# Patient Record
Sex: Male | Born: 1950
Health system: Southern US, Community
[De-identification: ages and names within clinical notes are randomized; demographics above are authoritative.]

## PROBLEM LIST (undated history)

## (undated) DIAGNOSIS — E669 Obesity, unspecified: Secondary | ICD-10-CM

## (undated) DIAGNOSIS — G4730 Sleep apnea, unspecified: Secondary | ICD-10-CM

## (undated) DIAGNOSIS — I499 Cardiac arrhythmia, unspecified: Secondary | ICD-10-CM

## (undated) DIAGNOSIS — E785 Hyperlipidemia, unspecified: Secondary | ICD-10-CM

## (undated) DIAGNOSIS — I10 Essential (primary) hypertension: Secondary | ICD-10-CM

## (undated) DIAGNOSIS — R319 Hematuria, unspecified: Secondary | ICD-10-CM

## (undated) DIAGNOSIS — J302 Other seasonal allergic rhinitis: Secondary | ICD-10-CM

## (undated) DIAGNOSIS — I4891 Unspecified atrial fibrillation: Secondary | ICD-10-CM

## (undated) DIAGNOSIS — M1990 Unspecified osteoarthritis, unspecified site: Secondary | ICD-10-CM

## (undated) DIAGNOSIS — E1121 Type 2 diabetes mellitus with diabetic nephropathy: Secondary | ICD-10-CM

## (undated) DIAGNOSIS — N181 Chronic kidney disease, stage 1: Secondary | ICD-10-CM

## (undated) HISTORY — DX: Obesity, unspecified: E66.9

## (undated) HISTORY — PX: VASECTOMY REVERSAL: SHX243

## (undated) HISTORY — DX: Hematuria, unspecified: R31.9

## (undated) HISTORY — DX: Type 2 diabetes mellitus with diabetic nephropathy: E11.21

## (undated) HISTORY — DX: Chronic kidney disease, stage 1: N18.1

## (undated) HISTORY — DX: Unspecified atrial fibrillation: I48.91

## (undated) HISTORY — DX: Other seasonal allergic rhinitis: J30.2

## (undated) HISTORY — PX: VASECTOMY: SHX75

## (undated) HISTORY — DX: Hyperlipidemia, unspecified: E78.5

## (undated) HISTORY — DX: Essential (primary) hypertension: I10

---

## 2004-06-10 HISTORY — PX: COLONOSCOPY: SHX174

## 2005-01-02 ENCOUNTER — Ambulatory Visit: Payer: Self-pay | Admitting: Internal Medicine

## 2005-05-06 ENCOUNTER — Ambulatory Visit: Payer: Self-pay | Admitting: Internal Medicine

## 2005-05-23 ENCOUNTER — Ambulatory Visit: Payer: Self-pay | Admitting: Internal Medicine

## 2005-06-14 ENCOUNTER — Ambulatory Visit: Payer: Self-pay | Admitting: Internal Medicine

## 2005-07-05 ENCOUNTER — Ambulatory Visit: Payer: Self-pay | Admitting: Internal Medicine

## 2006-03-29 ENCOUNTER — Ambulatory Visit: Payer: Self-pay | Admitting: Family Medicine

## 2011-06-25 ENCOUNTER — Ambulatory Visit (INDEPENDENT_AMBULATORY_CARE_PROVIDER_SITE_OTHER): Payer: BC Managed Care – PPO | Admitting: Physician Assistant

## 2011-06-25 DIAGNOSIS — I10 Essential (primary) hypertension: Secondary | ICD-10-CM

## 2011-06-25 DIAGNOSIS — E782 Mixed hyperlipidemia: Secondary | ICD-10-CM

## 2011-06-25 DIAGNOSIS — R809 Proteinuria, unspecified: Secondary | ICD-10-CM

## 2011-07-21 ENCOUNTER — Other Ambulatory Visit: Payer: Self-pay | Admitting: Physician Assistant

## 2011-07-22 ENCOUNTER — Other Ambulatory Visit: Payer: Self-pay

## 2011-07-22 MED ORDER — ENALAPRIL MALEATE 10 MG PO TABS: 10.0000 mg | ORAL_TABLET | Freq: Every day | ORAL | Status: DC

## 2011-07-22 NOTE — Telephone Encounter (Signed)
PLEASE PULL CHART 

## 2011-09-14 ENCOUNTER — Other Ambulatory Visit: Payer: Self-pay | Admitting: Physician Assistant

## 2011-10-22 ENCOUNTER — Other Ambulatory Visit: Payer: Self-pay | Admitting: Internal Medicine

## 2011-12-04 ENCOUNTER — Other Ambulatory Visit: Payer: Self-pay | Admitting: Physician Assistant

## 2011-12-04 NOTE — Telephone Encounter (Signed)
Need paper chart please.  

## 2011-12-24 ENCOUNTER — Encounter: Payer: Self-pay | Admitting: Physician Assistant

## 2011-12-24 ENCOUNTER — Ambulatory Visit (INDEPENDENT_AMBULATORY_CARE_PROVIDER_SITE_OTHER): Payer: BC Managed Care – PPO | Admitting: Physician Assistant

## 2011-12-24 VITALS — BP 150/86 | HR 77 | Temp 98.4°F | Resp 16 | Ht 67.0 in | Wt 241.0 lb

## 2011-12-24 DIAGNOSIS — J309 Allergic rhinitis, unspecified: Secondary | ICD-10-CM

## 2011-12-24 DIAGNOSIS — E1169 Type 2 diabetes mellitus with other specified complication: Secondary | ICD-10-CM | POA: Insufficient documentation

## 2011-12-24 DIAGNOSIS — J302 Other seasonal allergic rhinitis: Secondary | ICD-10-CM | POA: Insufficient documentation

## 2011-12-24 DIAGNOSIS — Z0000 Encounter for general adult medical examination without abnormal findings: Secondary | ICD-10-CM

## 2011-12-24 DIAGNOSIS — E782 Mixed hyperlipidemia: Secondary | ICD-10-CM

## 2011-12-24 DIAGNOSIS — Z125 Encounter for screening for malignant neoplasm of prostate: Secondary | ICD-10-CM

## 2011-12-24 DIAGNOSIS — Z1211 Encounter for screening for malignant neoplasm of colon: Secondary | ICD-10-CM

## 2011-12-24 DIAGNOSIS — R809 Proteinuria, unspecified: Secondary | ICD-10-CM

## 2011-12-24 DIAGNOSIS — I10 Essential (primary) hypertension: Secondary | ICD-10-CM

## 2011-12-24 DIAGNOSIS — E785 Hyperlipidemia, unspecified: Secondary | ICD-10-CM | POA: Insufficient documentation

## 2011-12-24 LAB — CBC WITH DIFFERENTIAL/PLATELET
Basophils Absolute: 0 10*3/uL (ref 0.0–0.1)
Basophils Relative: 0 % (ref 0–1)
Eosinophils Absolute: 0.1 10*3/uL (ref 0.0–0.7)
Eosinophils Relative: 2 % (ref 0–5)
HCT: 42 % (ref 39.0–52.0)
Hemoglobin: 14.4 g/dL (ref 13.0–17.0)
Lymphocytes Relative: 23 % (ref 12–46)
Lymphs Abs: 2 10*3/uL (ref 0.7–4.0)
MCH: 29.1 pg (ref 26.0–34.0)
MCHC: 34.3 g/dL (ref 30.0–36.0)
MCV: 85 fL (ref 78.0–100.0)
Monocytes Absolute: 0.6 10*3/uL (ref 0.1–1.0)
Monocytes Relative: 7 % (ref 3–12)
Neutro Abs: 5.9 10*3/uL (ref 1.7–7.7)
Neutrophils Relative %: 68 % (ref 43–77)
Platelets: 294 10*3/uL (ref 150–400)
RBC: 4.94 MIL/uL (ref 4.22–5.81)
RDW: 13.9 % (ref 11.5–15.5)
WBC: 8.7 10*3/uL (ref 4.0–10.5)

## 2011-12-24 LAB — LIPID PANEL
Cholesterol: 170 mg/dL (ref 0–200)
HDL: 39 mg/dL — ABNORMAL LOW (ref >39)
LDL Cholesterol: 92 mg/dL (ref 0–99)
Total CHOL/HDL Ratio: 4.4 Ratio
Triglycerides: 194 mg/dL — ABNORMAL HIGH (ref <150)
VLDL: 39 mg/dL (ref 0–40)

## 2011-12-24 LAB — POCT URINALYSIS DIPSTICK
Bilirubin, UA: NEGATIVE
Glucose, UA: NEGATIVE
Ketones, UA: NEGATIVE
Leukocytes, UA: NEGATIVE
Nitrite, UA: NEGATIVE
Protein, UA: 30
Spec Grav, UA: 1.02
Urobilinogen, UA: 0.2
pH, UA: 6.5

## 2011-12-24 LAB — COMPREHENSIVE METABOLIC PANEL
ALT: 19 U/L (ref 0–53)
AST: 15 U/L (ref 0–37)
Albumin: 4.2 g/dL (ref 3.5–5.2)
Alkaline Phosphatase: 68 U/L (ref 39–117)
BUN: 17 mg/dL (ref 6–23)
CO2: 32 mEq/L (ref 19–32)
Calcium: 10 mg/dL (ref 8.4–10.5)
Chloride: 99 mEq/L (ref 96–112)
Creat: 0.87 mg/dL (ref 0.50–1.35)
Glucose, Bld: 109 mg/dL — ABNORMAL HIGH (ref 70–99)
Potassium: 4.5 mEq/L (ref 3.5–5.3)
Sodium: 135 mEq/L (ref 135–145)
Total Bilirubin: 0.3 mg/dL (ref 0.3–1.2)
Total Protein: 6.6 g/dL (ref 6.0–8.3)

## 2011-12-24 LAB — POCT UA - MICROSCOPIC ONLY
Bacteria, U Microscopic: NEGATIVE
Casts, Ur, LPF, POC: NEGATIVE
Crystals, Ur, HPF, POC: NEGATIVE
Yeast, UA: NEGATIVE

## 2011-12-24 LAB — IFOBT (OCCULT BLOOD): IFOBT: NEGATIVE

## 2011-12-24 LAB — PSA: PSA: 0.57 ng/mL (ref <=4.00)

## 2011-12-24 NOTE — Progress Notes (Signed)
Subjective:    Patient ID: James Robbins, male    DOB: [birth year], 61 y.o.   MRN: [id number]  HPI  This 61 y.o. Male presents for CPE. ROS performed by Anselm Pancoast, CMA, and reviewed by me.  Review of Systems  Constitutional: Negative.   HENT: Negative.   Eyes: Negative.   Respiratory: Negative.   Cardiovascular: Negative.   Gastrointestinal: Negative.   Genitourinary: Negative.   Musculoskeletal: Negative.   Skin: Negative.   Neurological: Negative.   Hematological: Negative.   Psychiatric/Behavioral: Negative.    He notes that he has not been working on healthy eating or exercise.  Past Medical History  Diagnosis Date  . Allergy     seasonal  . Obesity   . Hypertension   . Hyperlipidemia   . Proteinuria     Prior to Admission medications   Medication Sig Start Date End Date Taking? Authorizing Provider  aspirin 81 MG tablet Take 81 mg by mouth daily.   Yes Historical Provider, MD  cetirizine (ZYRTEC) 10 MG tablet Take 10 mg by mouth daily.   Yes Historical Provider, MD  enalapril (VASOTEC) 10 MG tablet Take 1 tablet (10 mg total) by mouth daily. NEEDS OFFICE VISIT/LABS FOR MORE 12/04/11  Yes Ryan M Dunn, PA-C  simvastatin (ZOCOR) 40 MG tablet TAKE 1 TABLET (40 MG TOTAL) BY MOUTH AT BEDTIME. NEEDS OFFICE VISIT FOR MORE 10/22/11  Yes Sondra Barges, PA-C    No Known Allergies  History   Social History  . Marital Status: Married    Spouse Name: N/A    Number of Children: 2  . Years of Education: 14   Occupational History  . retired [employer]    2006   Social History Main Topics  . Smoking status: Former Smoker    Quit date: 06/09/1986  . Smokeless tobacco: Never Used  . Alcohol Use: No  . Drug Use: No   Family History  Problem Relation Age of Onset  . Diabetes Mother   . Heart disease Father   . Osteoporosis Sister        Objective:   Physical Exam  Vitals reviewed. Constitutional: He is oriented to person, place, and time. Vital signs  are normal. He appears well-developed and well-nourished.  Non-toxic appearance. He does not have a sickly appearance. He does not appear ill. No distress.  HENT:  Head: Normocephalic and atraumatic. No trismus in the jaw.  Right Ear: Hearing, tympanic membrane, external ear and ear canal normal.  Left Ear: Hearing, tympanic membrane, external ear and ear canal normal.  Nose: Nose normal.  Mouth/Throat: Uvula is midline, oropharynx is clear and moist and mucous membranes are normal. He does not have dentures. No oral lesions. Normal dentition. No dental abscesses, uvula swelling, lacerations or dental caries.  Eyes: Conjunctivae and EOM are normal. Pupils are equal, round, and reactive to light. Right eye exhibits no discharge. Left eye exhibits no discharge. No scleral icterus.  Fundoscopic exam:      The right eye shows no arteriolar narrowing, no AV nicking, no exudate, no hemorrhage and no papilledema. The right eye shows red reflex.The right eye shows no venous pulsations.      The left eye shows no arteriolar narrowing, no AV nicking, no exudate, no hemorrhage and no papilledema. The left eye shows red reflex.The left eye shows no venous pulsations. Neck: Normal range of motion, full passive range of motion without pain and phonation normal. Neck supple. No spinous process  tenderness and no muscular tenderness present. No rigidity. No tracheal deviation, no edema, no erythema and normal range of motion present. No thyromegaly present.  Cardiovascular: Normal rate, regular rhythm, S1 normal, S2 normal, normal heart sounds, intact distal pulses and normal pulses.  Exam reveals no gallop and no friction rub.   No murmur heard. Pulmonary/Chest: Effort normal and breath sounds normal. No respiratory distress. He has no wheezes. He has no rales.  Abdominal: Soft. Normal appearance and bowel sounds are normal. He exhibits no distension and no mass. There is no hepatosplenomegaly. There is no  tenderness. There is no rebound and no guarding. No hernia. Hernia confirmed negative in the right inguinal area and confirmed negative in the left inguinal area.  Genitourinary: Rectum normal, prostate normal, testes normal and penis normal. Rectal exam shows no external hemorrhoid, no internal hemorrhoid, no fissure, no mass, no tenderness and anal tone normal. Guaiac negative stool. Circumcised. No phimosis, paraphimosis, hypospadias, penile erythema or penile tenderness. No discharge found.  Musculoskeletal: Normal range of motion. He exhibits no edema and no tenderness.       Right shoulder: Normal.       Left shoulder: Normal.       Right elbow: Normal.      Left elbow: Normal.       Right wrist: Normal.       Left wrist: Normal.       Right hip: Normal.       Left hip: Normal.       Right knee: Normal.       Left knee: Normal.       Right ankle: Normal. Achilles tendon normal.       Left ankle: Normal. Achilles tendon normal.       Cervical back: Normal. He exhibits normal range of motion, no tenderness, no bony tenderness, no swelling, no edema, no deformity, no laceration, no pain, no spasm and normal pulse.       Thoracic back: Normal.       Lumbar back: Normal.       Right upper arm: Normal.       Left upper arm: Normal.       Right forearm: Normal.       Left forearm: Normal.       Right hand: Normal.       Left hand: Normal.       Right upper leg: Normal.       Left upper leg: Normal.       Right lower leg: Normal.       Left lower leg: Normal.       Right foot: Normal.       Left foot: Normal.  Lymphadenopathy:       Head (right side): No submental, no submandibular, no tonsillar, no preauricular, no posterior auricular and no occipital adenopathy present.       Head (left side): No submental, no submandibular, no tonsillar, no preauricular, no posterior auricular and no occipital adenopathy present.    He has no cervical adenopathy.       Right: No inguinal and no  supraclavicular adenopathy present.       Left: No inguinal and no supraclavicular adenopathy present.  Neurological: He is alert and oriented to person, place, and time. He has normal strength and normal reflexes. He displays no tremor. No cranial nerve deficit. He exhibits normal muscle tone. Coordination and gait normal.  Skin: Skin is warm, dry and intact. No abrasion,  no ecchymosis, no laceration, no lesion and no rash noted. He is not diaphoretic. No cyanosis or erythema. No pallor. Nails show no clubbing.  Psychiatric: He has a normal mood and affect. His speech is normal and behavior is normal. Judgment and thought content normal. Cognition and memory are normal.   Results for orders placed in visit on 12/24/11  POCT UA - MICROSCOPIC ONLY      Component Value Range   WBC, Ur, HPF, POC 0-1     RBC, urine, microscopic 0-5     Bacteria, U Microscopic neg     Mucus, UA trace     Epithelial cells, urine per micros 0-2     Crystals, Ur, HPF, POC neg     Casts, Ur, LPF, POC neg     Yeast, UA neg    POCT URINALYSIS DIPSTICK      Component Value Range   Color, UA yellow     Clarity, UA clear     Glucose, UA neg     Bilirubin, UA neg     Ketones, UA neg     Spec Grav, UA 1.020     Blood, UA moderate     pH, UA 6.5     Protein, UA 30     Urobilinogen, UA 0.2     Nitrite, UA neg     Leukocytes, UA Negative    IFOBT (OCCULT BLOOD)      Component Value Range   IFOBT Negative        Assessment & Plan:   1. Routine general medical examination at a health care facility    2. Unspecified essential hypertension  CBC with Differential, Comprehensive metabolic panel  3. Other and unspecified hyperlipidemia  Lipid panel  4. Proteinuria  Microalbumin, urine, POCT UA - Microscopic Only, POCT urinalysis dipstick  5. Screening for prostate cancer  PSA  6. Screening for colon cancer  IFOBT POC (occult bld, rslt in office)   Continue current treatment.  Increase efforts for healthy eating  and regular exercise.

## 2011-12-24 NOTE — Patient Instructions (Signed)

## 2011-12-25 ENCOUNTER — Encounter: Payer: Self-pay | Admitting: Physician Assistant

## 2011-12-25 LAB — MICROALBUMIN, URINE: Microalb, Ur: 10.14 mg/dL — ABNORMAL HIGH (ref 0.00–1.89)

## 2012-01-03 ENCOUNTER — Other Ambulatory Visit: Payer: Self-pay | Admitting: Physician Assistant

## 2012-01-18 ENCOUNTER — Other Ambulatory Visit: Payer: Self-pay | Admitting: Physician Assistant

## 2012-02-07 ENCOUNTER — Other Ambulatory Visit: Payer: Self-pay | Admitting: Physician Assistant

## 2012-03-15 ENCOUNTER — Other Ambulatory Visit: Payer: Self-pay | Admitting: Physician Assistant

## 2012-04-13 ENCOUNTER — Other Ambulatory Visit: Payer: Self-pay | Admitting: Physician Assistant

## 2012-04-17 ENCOUNTER — Other Ambulatory Visit: Payer: Self-pay | Admitting: Physician Assistant

## 2012-05-19 ENCOUNTER — Other Ambulatory Visit: Payer: Self-pay | Admitting: Physician Assistant

## 2012-05-19 NOTE — Telephone Encounter (Signed)
Needs ov/labs 

## 2012-06-10 DIAGNOSIS — R319 Hematuria, unspecified: Secondary | ICD-10-CM

## 2012-06-10 HISTORY — DX: Hematuria, unspecified: R31.9

## 2012-06-27 ENCOUNTER — Other Ambulatory Visit: Payer: Self-pay | Admitting: Physician Assistant

## 2012-07-20 ENCOUNTER — Other Ambulatory Visit: Payer: Self-pay | Admitting: Physician Assistant

## 2012-07-30 ENCOUNTER — Other Ambulatory Visit: Payer: Self-pay | Admitting: Physician Assistant

## 2012-08-01 ENCOUNTER — Other Ambulatory Visit: Payer: Self-pay | Admitting: Physician Assistant

## 2012-08-03 ENCOUNTER — Other Ambulatory Visit: Payer: Self-pay | Admitting: Physician Assistant

## 2012-12-16 ENCOUNTER — Other Ambulatory Visit: Payer: Self-pay | Admitting: Physician Assistant

## 2013-01-05 ENCOUNTER — Encounter: Payer: BC Managed Care – PPO | Admitting: Physician Assistant

## 2013-01-13 ENCOUNTER — Other Ambulatory Visit: Payer: Self-pay | Admitting: Physician Assistant

## 2013-08-30 ENCOUNTER — Other Ambulatory Visit: Payer: Self-pay | Admitting: Family Medicine

## 2013-08-30 ENCOUNTER — Encounter: Payer: Self-pay | Admitting: Family Medicine

## 2013-08-30 ENCOUNTER — Ambulatory Visit (INDEPENDENT_AMBULATORY_CARE_PROVIDER_SITE_OTHER): Payer: PRIVATE HEALTH INSURANCE | Admitting: Family Medicine

## 2013-08-30 VITALS — BP 126/78 | HR 84 | Temp 98.0°F | Ht 67.25 in | Wt 244.5 lb

## 2013-08-30 DIAGNOSIS — E785 Hyperlipidemia, unspecified: Secondary | ICD-10-CM

## 2013-08-30 DIAGNOSIS — R809 Proteinuria, unspecified: Secondary | ICD-10-CM

## 2013-08-30 DIAGNOSIS — J309 Allergic rhinitis, unspecified: Secondary | ICD-10-CM

## 2013-08-30 DIAGNOSIS — E1129 Type 2 diabetes mellitus with other diabetic kidney complication: Secondary | ICD-10-CM | POA: Insufficient documentation

## 2013-08-30 DIAGNOSIS — E669 Obesity, unspecified: Secondary | ICD-10-CM

## 2013-08-30 DIAGNOSIS — E1121 Type 2 diabetes mellitus with diabetic nephropathy: Secondary | ICD-10-CM | POA: Insufficient documentation

## 2013-08-30 DIAGNOSIS — J302 Other seasonal allergic rhinitis: Secondary | ICD-10-CM

## 2013-08-30 DIAGNOSIS — R739 Hyperglycemia, unspecified: Secondary | ICD-10-CM | POA: Insufficient documentation

## 2013-08-30 DIAGNOSIS — N181 Chronic kidney disease, stage 1: Secondary | ICD-10-CM

## 2013-08-30 DIAGNOSIS — I10 Essential (primary) hypertension: Secondary | ICD-10-CM

## 2013-08-30 DIAGNOSIS — E1122 Type 2 diabetes mellitus with diabetic chronic kidney disease: Secondary | ICD-10-CM | POA: Insufficient documentation

## 2013-08-30 DIAGNOSIS — R7309 Other abnormal glucose: Secondary | ICD-10-CM

## 2013-08-30 LAB — BASIC METABOLIC PANEL
BUN: 14 mg/dL (ref 6–23)
CO2: 30 mEq/L (ref 19–32)
Calcium: 10 mg/dL (ref 8.4–10.5)
Chloride: 102 mEq/L (ref 96–112)
Creatinine, Ser: 0.9 mg/dL (ref 0.4–1.5)
GFR: 91.28 mL/min (ref >60.00)
Glucose, Bld: 150 mg/dL — ABNORMAL HIGH (ref 70–99)
Potassium: 4.1 mEq/L (ref 3.5–5.1)
Sodium: 139 mEq/L (ref 135–145)

## 2013-08-30 LAB — LIPID PANEL
Cholesterol: 224 mg/dL — ABNORMAL HIGH (ref 0–200)
HDL: 37.6 mg/dL — ABNORMAL LOW (ref >39.00)
LDL Cholesterol: 142 mg/dL — ABNORMAL HIGH (ref 0–99)
Total CHOL/HDL Ratio: 6
Triglycerides: 224 mg/dL — ABNORMAL HIGH (ref 0.0–149.0)
VLDL: 44.8 mg/dL — ABNORMAL HIGH (ref 0.0–40.0)

## 2013-08-30 LAB — MICROALBUMIN / CREATININE URINE RATIO
Creatinine,U: 17.8 mg/dL
Microalb Creat Ratio: 96.1 mg/g — ABNORMAL HIGH (ref 0.0–30.0)
Microalb, Ur: 17.1 mg/dL — ABNORMAL HIGH (ref 0.0–1.9)

## 2013-08-30 LAB — HEMOGLOBIN A1C: Hgb A1c MFr Bld: 7 % — ABNORMAL HIGH (ref 4.6–6.5)

## 2013-08-30 MED ORDER — SIMVASTATIN 40 MG PO TABS: ORAL_TABLET | ORAL | Status: DC

## 2013-08-30 MED ORDER — METFORMIN HCL 500 MG PO TABS: 500.0000 mg | ORAL_TABLET | Freq: Two times a day (BID) | ORAL | Status: DC

## 2013-08-30 MED ORDER — ENALAPRIL MALEATE 10 MG PO TABS: 10.0000 mg | ORAL_TABLET | Freq: Every day | ORAL | Status: DC

## 2013-08-30 NOTE — Assessment & Plan Note (Signed)
Continue simvastatin, check FLP today. Off statin for last several weeks.

## 2013-08-30 NOTE — Assessment & Plan Note (Signed)
Continue f/u with Dr. Remus Blake.

## 2013-08-30 NOTE — Assessment & Plan Note (Signed)
Check urine microalb/cr ratio today.  Consider restarting ACEI.

## 2013-08-30 NOTE — Progress Notes (Addendum)
BP 126/78  Pulse 84  Temp(Src) 98 F (36.7 C) (Oral)  Ht 5' 7.25" (1.708 m)  Wt 244 lb 8 oz (110.904 kg)  BMI 38.02 kg/m2  SpO2 96%   CC: new pt to establish  Subjective:    Patient ID: James Robbins, male    DOB: [birth year], 63 y.o.   MRN: [id number]  HPI: James Robbins is a 63 y.o. male presenting on 08/30/2013 for Establish Care   Prior seen at [facility] then [facility].  Actually ran out of meds for last several weeks.  Seasonal allergic rhintis - sees Dr. Remus Blake and gets weekly allergy shots - immunotherapy for last 10 years.  HTN - ran out of enalapril 10mg  daily.  Has bp checked weekly at allergy shots.  Hyperglycemia - mother with h/o diabetes on insulin.  Pt is on metformin but no A1c in chart.  Will check today.  HLD - compliant with simvastatin without muscle aches.  Preventative: Last CPE several years ago. Flu - through city yearly zostavax 2012  Lives with wife and children, no pets Occupation: retired Chemical engineer Edu: HS Activity: no regular exercise Diet: good water, fruits/vegetables daily, stopped sodas  Relevant past medical, surgical, family and social history reviewed and updated as indicated.  Allergies and medications reviewed and updated. Current Outpatient Prescriptions on File Prior to Visit  Medication Sig  . aspirin 81 MG tablet Take 81 mg by mouth daily.  . cetirizine (ZYRTEC) 10 MG tablet Take 10 mg by mouth daily.   No current facility-administered medications on file prior to visit.    Review of Systems Per HPI unless specifically indicated above    Objective:    BP 126/78  Pulse 84  Temp(Src) 98 F (36.7 C) (Oral)  Ht 5' 7.25" (1.708 m)  Wt 244 lb 8 oz (110.904 kg)  BMI 38.02 kg/m2  SpO2 96%  Physical Exam  Nursing note and vitals reviewed. Constitutional: He is oriented to person, place, and time. He appears well-developed and well-nourished. No distress.  HENT:  Head: Normocephalic and atraumatic.  Right Ear:  Hearing, tympanic membrane, external ear and ear canal normal.  Left Ear: Hearing, tympanic membrane, external ear and ear canal normal.  Nose: Nose normal.  Mouth/Throat: Uvula is midline, oropharynx is clear and moist and mucous membranes are normal. No oropharyngeal exudate, posterior oropharyngeal edema or posterior oropharyngeal erythema.  Eyes: Conjunctivae and EOM are normal. Pupils are equal, round, and reactive to light. No scleral icterus.  Neck: Normal range of motion. Neck supple. Carotid bruit is not present. No thyromegaly present.  Cardiovascular: Normal rate, regular rhythm, normal heart sounds and intact distal pulses.   No murmur heard. Pulses:      Radial pulses are 2+ on the right side, and 2+ on the left side.  Pulmonary/Chest: Effort normal and breath sounds normal. No respiratory distress. He has no wheezes. He has no rales.  Musculoskeletal: Normal range of motion. He exhibits no edema.  Lymphadenopathy:    He has no cervical adenopathy.  Neurological: He is alert and oriented to person, place, and time.  CN grossly intact, station and gait intact  Skin: Skin is warm and dry. No rash noted.  Psychiatric: He has a normal mood and affect. His behavior is normal. Judgment and thought content normal.       Assessment & Plan:   Problem List Items Addressed This Visit   HLD (hyperlipidemia)     Continue simvastatin, check FLP today. Off  statin for last several weeks.    Relevant Medications      simvastatin (ZOCOR) tablet   HTN (hypertension) - Primary      Carries this dx, prior on enalapril 10mg  daily and hctz 12.5mg  daily, but off meds for last several weeks and today's BP stable. BP Readings from Last 3 Encounters:  08/30/13 126/78  12/24/11 150/86  Will continue to monitor, pt will monitor at allergist's office and notify me if persistently elevated. Discussed possible need for ACEI despite normal blood pressures for renoprotective effect.     Hyperglycemia     Check fasting glucose and A1c today.  continue metformin for now, but with option of decreasing.    Relevant Orders      Hemoglobin A1c   Obesity     Body mass index is 38.02 kg/(m^2). Encouraged regular exercise in form of walking.   Pt will work on this and will reassess weight next visit.    Relevant Medications      metFORMIN (GLUCOPHAGE) tablet   Proteinuria     Check urine microalb/cr ratio today.  Consider restarting ACEI.    Relevant Orders      Microalbumin / creatinine urine ratio   Seasonal allergic rhinitis     Continue f/u with Dr. Remus Blake.        Follow up plan: Return in about 6 months (around 03/02/2014), or as needed, for annual exam, prior fasting for blood work.

## 2013-08-30 NOTE — Patient Instructions (Signed)
Let's hold blood pressure medicines for now, and recheck next visit.   Keep an eye on blood pressure at allergist's office - and let me know if your blood pressure is consistently >140/90. I've refilled simvastatin and metformin - continue for now. We will let you know plan based on today's blood work. Blood work and urine checked today for protein. Work on incorporating exercise into your routine - maybe some walking daily? Good to meet you today, return in 6 months for physical.

## 2013-08-30 NOTE — Assessment & Plan Note (Signed)
Body mass index is 38.02 kg/(m^2). Encouraged regular exercise in form of walking.   Pt will work on this and will reassess weight next visit.

## 2013-08-30 NOTE — Assessment & Plan Note (Addendum)
Check fasting glucose and A1c today.  continue metformin for now, but with option of decreasing.

## 2013-08-30 NOTE — Assessment & Plan Note (Signed)
Carries this dx, prior on enalapril 10mg  daily and hctz 12.5mg  daily, but off meds for last several weeks and today's BP stable. BP Readings from Last 3 Encounters:  08/30/13 126/78  12/24/11 150/86  Will continue to monitor, pt will monitor at allergist's office and notify me if persistently elevated. Discussed possible need for ACEI despite normal blood pressures for renoprotective effect.

## 2013-08-30 NOTE — Progress Notes (Signed)
Pre visit review using our clinic review tool, if applicable. No additional management support is needed unless otherwise documented below in the visit note. 

## 2013-08-31 ENCOUNTER — Telehealth: Payer: Self-pay | Admitting: Family Medicine

## 2013-08-31 NOTE — Telephone Encounter (Signed)
Relevant patient education assigned to patient using Emmi. ° °

## 2013-09-04 ENCOUNTER — Encounter: Payer: Self-pay | Admitting: Family Medicine

## 2013-09-17 ENCOUNTER — Telehealth: Payer: Self-pay | Admitting: Family Medicine

## 2013-09-17 NOTE — Telephone Encounter (Signed)
Spoke with pt.  He would like to pick up medical records that have been reviewed by Dr. Danise Mina.  Placed in box in front office.

## 2014-02-12 ENCOUNTER — Other Ambulatory Visit: Payer: Self-pay | Admitting: Family Medicine

## 2014-02-12 DIAGNOSIS — E1121 Type 2 diabetes mellitus with diabetic nephropathy: Secondary | ICD-10-CM

## 2014-02-12 DIAGNOSIS — N181 Chronic kidney disease, stage 1: Secondary | ICD-10-CM

## 2014-02-12 DIAGNOSIS — E669 Obesity, unspecified: Secondary | ICD-10-CM

## 2014-02-12 DIAGNOSIS — E785 Hyperlipidemia, unspecified: Secondary | ICD-10-CM

## 2014-02-12 DIAGNOSIS — Z125 Encounter for screening for malignant neoplasm of prostate: Secondary | ICD-10-CM

## 2014-02-12 DIAGNOSIS — I10 Essential (primary) hypertension: Secondary | ICD-10-CM

## 2014-02-15 ENCOUNTER — Other Ambulatory Visit: Payer: PRIVATE HEALTH INSURANCE

## 2014-02-16 ENCOUNTER — Other Ambulatory Visit (INDEPENDENT_AMBULATORY_CARE_PROVIDER_SITE_OTHER): Payer: PRIVATE HEALTH INSURANCE

## 2014-02-16 DIAGNOSIS — N058 Unspecified nephritic syndrome with other morphologic changes: Secondary | ICD-10-CM

## 2014-02-16 DIAGNOSIS — E1129 Type 2 diabetes mellitus with other diabetic kidney complication: Secondary | ICD-10-CM

## 2014-02-16 DIAGNOSIS — R739 Hyperglycemia, unspecified: Secondary | ICD-10-CM

## 2014-02-16 DIAGNOSIS — N181 Chronic kidney disease, stage 1: Secondary | ICD-10-CM

## 2014-02-16 DIAGNOSIS — I10 Essential (primary) hypertension: Secondary | ICD-10-CM

## 2014-02-16 DIAGNOSIS — R7309 Other abnormal glucose: Secondary | ICD-10-CM

## 2014-02-16 DIAGNOSIS — E785 Hyperlipidemia, unspecified: Secondary | ICD-10-CM

## 2014-02-16 DIAGNOSIS — Z125 Encounter for screening for malignant neoplasm of prostate: Secondary | ICD-10-CM

## 2014-02-16 DIAGNOSIS — E1121 Type 2 diabetes mellitus with diabetic nephropathy: Secondary | ICD-10-CM

## 2014-02-16 LAB — LIPID PANEL
Cholesterol: 155 mg/dL (ref 0–200)
HDL: 37.7 mg/dL — ABNORMAL LOW (ref >39.00)
LDL Cholesterol: 90 mg/dL (ref 0–99)
NonHDL: 117.3
Total CHOL/HDL Ratio: 4
Triglycerides: 139 mg/dL (ref 0.0–149.0)
VLDL: 27.8 mg/dL (ref 0.0–40.0)

## 2014-02-16 LAB — RENAL FUNCTION PANEL
Albumin: 4.2 g/dL (ref 3.5–5.2)
BUN: 14 mg/dL (ref 6–23)
CO2: 27 mEq/L (ref 19–32)
Calcium: 9.9 mg/dL (ref 8.4–10.5)
Chloride: 101 mEq/L (ref 96–112)
Creatinine, Ser: 0.9 mg/dL (ref 0.4–1.5)
GFR: 85.58 mL/min (ref >60.00)
Glucose, Bld: 149 mg/dL — ABNORMAL HIGH (ref 70–99)
Phosphorus: 3 mg/dL (ref 2.3–4.6)
Potassium: 4.3 mEq/L (ref 3.5–5.1)
Sodium: 137 mEq/L (ref 135–145)

## 2014-02-16 LAB — MICROALBUMIN / CREATININE URINE RATIO
Creatinine,U: 21.9 mg/dL
Microalb Creat Ratio: 83.3 mg/g — ABNORMAL HIGH (ref 0.0–30.0)
Microalb, Ur: 18.2 mg/dL — ABNORMAL HIGH (ref 0.0–1.9)

## 2014-02-16 LAB — PSA, MEDICARE: PSA: 0.74 ng/ml (ref 0.10–4.00)

## 2014-02-16 LAB — HEMOGLOBIN A1C: Hgb A1c MFr Bld: 6.8 % — ABNORMAL HIGH (ref 4.6–6.5)

## 2014-02-21 ENCOUNTER — Encounter: Payer: Self-pay | Admitting: Family Medicine

## 2014-02-21 ENCOUNTER — Ambulatory Visit (INDEPENDENT_AMBULATORY_CARE_PROVIDER_SITE_OTHER): Payer: PRIVATE HEALTH INSURANCE | Admitting: Family Medicine

## 2014-02-21 VITALS — BP 140/78 | HR 92 | Temp 97.9°F | Ht 67.25 in | Wt 241.2 lb

## 2014-02-21 DIAGNOSIS — E1121 Type 2 diabetes mellitus with diabetic nephropathy: Secondary | ICD-10-CM

## 2014-02-21 DIAGNOSIS — I10 Essential (primary) hypertension: Secondary | ICD-10-CM

## 2014-02-21 DIAGNOSIS — N181 Chronic kidney disease, stage 1: Secondary | ICD-10-CM

## 2014-02-21 DIAGNOSIS — N058 Unspecified nephritic syndrome with other morphologic changes: Secondary | ICD-10-CM

## 2014-02-21 DIAGNOSIS — E1129 Type 2 diabetes mellitus with other diabetic kidney complication: Secondary | ICD-10-CM

## 2014-02-21 DIAGNOSIS — Z0001 Encounter for general adult medical examination with abnormal findings: Secondary | ICD-10-CM | POA: Insufficient documentation

## 2014-02-21 DIAGNOSIS — E669 Obesity, unspecified: Secondary | ICD-10-CM

## 2014-02-21 DIAGNOSIS — Z0000 Encounter for general adult medical examination without abnormal findings: Secondary | ICD-10-CM

## 2014-02-21 DIAGNOSIS — E785 Hyperlipidemia, unspecified: Secondary | ICD-10-CM

## 2014-02-21 DIAGNOSIS — Z23 Encounter for immunization: Secondary | ICD-10-CM

## 2014-02-21 NOTE — Assessment & Plan Note (Signed)
Continue to encourage weight loss through healthy diet and lifestyle.

## 2014-02-21 NOTE — Addendum Note (Signed)
Addended by: Royann Shivers A on: 02/21/2014 11:14 AM   Modules accepted: Orders

## 2014-02-21 NOTE — Patient Instructions (Addendum)
prevnar today. Try to increase regular activity level for goal weight loss. Get your flu shot at the city over next few weeks. Ask up front to contact [facility] to place colonoscopy in electronic chart. Return as needed or in 6 months for follow up diabetes.

## 2014-02-21 NOTE — Progress Notes (Signed)
Pre visit review using our clinic review tool, if applicable. No additional management support is needed unless otherwise documented below in the visit note. 

## 2014-02-21 NOTE — Assessment & Plan Note (Signed)
Chronic ,stable. Encouraged increased aerobic exercise to raise HDL. LDL at goal.

## 2014-02-21 NOTE — Assessment & Plan Note (Signed)
Preventative protocols reviewed and updated unless pt declined. Discussed healthy diet and lifestyle.  

## 2014-02-21 NOTE — Assessment & Plan Note (Signed)
Reviewed with patient - continue enalapril.

## 2014-02-21 NOTE — Assessment & Plan Note (Signed)
Chronic, stable. Continue regimen. 

## 2014-02-21 NOTE — Assessment & Plan Note (Signed)
Chronic, stable. Continue metformin regimen. Encouraged he remember to take bid (tends to forget PM dose). Lab Results  Component Value Date   HGBA1C 6.8* 02/16/2014

## 2014-02-21 NOTE — Progress Notes (Signed)
BP 140/78  Pulse 92  Temp(Src) 97.9 F (36.6 C) (Oral)  Ht 5' 7.25" (1.708 m)  Wt 241 lb 4 oz (109.43 kg)  BMI 37.51 kg/m2   CC: CPE Subjective:    Patient ID: James Robbins, male    DOB: 1950/06/25, 63 y.o.   MRN: [id number]  HPI: James Robbins is a 63 y.o. male presenting on 02/21/2014 for Annual Exam   Reviewed HTN, HLD, DM control.  Preventative: Colon cancer screening - colonoscopy 2006. Denies BM changes. No blood in stool. Prostate cancer screening - unsure. Discussed. Would like to continue. Flu - through city yearly zostavax 2012  prevnar - today Seat belt use discussed Sunscreen use discussed - no changing moles  Lives with wife and children, no pets  Occupation: retired Chemical engineer  Edu: HS  Activity: no regular exercise  Diet: good water, fruits/vegetables daily, stopped sodas  Relevant past medical, surgical, family and social history reviewed and updated as indicated.  Allergies and medications reviewed and updated. Current Outpatient Prescriptions on File Prior to Visit  Medication Sig  . aspirin 81 MG tablet Take 81 mg by mouth daily.  . cetirizine (ZYRTEC) 10 MG tablet Take 10 mg by mouth daily.  . enalapril (VASOTEC) 10 MG tablet Take 1 tablet (10 mg total) by mouth daily.  . metFORMIN (GLUCOPHAGE) 500 MG tablet Take 1 tablet (500 mg total) by mouth 2 (two) times daily with a meal.  . simvastatin (ZOCOR) 40 MG tablet TAKE 1 TABLET (40 MG TOTAL) BY MOUTH AT BEDTIME.   No current facility-administered medications on file prior to visit.    Review of Systems  Constitutional: Negative for fever, chills, activity change, appetite change, fatigue and unexpected weight change.  HENT: Negative for hearing loss.   Eyes: Negative for visual disturbance.  Respiratory: Negative for cough, chest tightness, shortness of breath and wheezing.   Cardiovascular: Negative for chest pain, palpitations and leg swelling.  Gastrointestinal: Negative for nausea,  vomiting, abdominal pain, diarrhea, constipation, blood in stool and abdominal distention.  Genitourinary: Negative for hematuria and difficulty urinating.  Musculoskeletal: Negative for arthralgias, myalgias and neck pain.  Skin: Negative for rash.  Neurological: Negative for dizziness, seizures, syncope and headaches.  Hematological: Negative for adenopathy. Does not bruise/bleed easily.  Psychiatric/Behavioral: Negative for dysphoric mood. The patient is not nervous/anxious.    Per HPI unless specifically indicated above    Objective:    BP 140/78  Pulse 92  Temp(Src) 97.9 F (36.6 C) (Oral)  Ht 5' 7.25" (1.708 m)  Wt 241 lb 4 oz (109.43 kg)  BMI 37.51 kg/m2  Physical Exam  Nursing note and vitals reviewed. Constitutional: He is oriented to person, place, and time. He appears well-developed and well-nourished. No distress.  HENT:  Head: Normocephalic and atraumatic.  Right Ear: Hearing, tympanic membrane, external ear and ear canal normal.  Left Ear: Hearing, tympanic membrane, external ear and ear canal normal.  Nose: Nose normal.  Mouth/Throat: Uvula is midline, oropharynx is clear and moist and mucous membranes are normal. No oropharyngeal exudate, posterior oropharyngeal edema or posterior oropharyngeal erythema.  Eyes: Conjunctivae and EOM are normal. Pupils are equal, round, and reactive to light. No scleral icterus.  Neck: Normal range of motion. Neck supple. Carotid bruit is not present. No thyromegaly present.  Cardiovascular: Normal rate, regular rhythm, normal heart sounds and intact distal pulses.   No murmur heard. Pulses:      Radial pulses are 2+ on the right side, and  2+ on the left side.  Pulmonary/Chest: Effort normal and breath sounds normal. No respiratory distress. He has no wheezes. He has no rales.  Abdominal: Soft. Bowel sounds are normal. He exhibits no distension and no mass. There is no tenderness. There is no rebound and no guarding.  Genitourinary:  Rectum normal and prostate normal. Rectal exam shows no external hemorrhoid, no internal hemorrhoid, no fissure, no mass, no tenderness and anal tone normal. Prostate is not enlarged (15gm) and not tender.  Musculoskeletal: Normal range of motion. He exhibits no edema.  Lymphadenopathy:    He has no cervical adenopathy.  Neurological: He is alert and oriented to person, place, and time.  CN grossly intact, station and gait intact  Skin: Skin is warm and dry. No rash noted.  Psychiatric: He has a normal mood and affect. His behavior is normal. Judgment and thought content normal.   Results for orders placed in visit on 02/16/14  LIPID PANEL      Result Value Ref Range   Cholesterol 155  0 - 200 mg/dL   Triglycerides 139.0  0.0 - 149.0 mg/dL   HDL 37.70 (*) >39.00 mg/dL   VLDL 27.8  0.0 - 40.0 mg/dL   LDL Cholesterol 90  0 - 99 mg/dL   Total CHOL/HDL Ratio 4     NonHDL 117.30    HEMOGLOBIN A1C      Result Value Ref Range   Hemoglobin A1C 6.8 (*) 4.6 - 6.5 %  RENAL FUNCTION PANEL      Result Value Ref Range   Sodium 137  135 - 145 mEq/L   Potassium 4.3  3.5 - 5.1 mEq/L   Chloride 101  96 - 112 mEq/L   CO2 27  19 - 32 mEq/L   Calcium 9.9  8.4 - 10.5 mg/dL   Albumin 4.2  3.5 - 5.2 g/dL   BUN 14  6 - 23 mg/dL   Creatinine, Ser 0.9  0.4 - 1.5 mg/dL   Glucose, Bld 149 (*) 70 - 99 mg/dL   Phosphorus 3.0  2.3 - 4.6 mg/dL   GFR 85.58  >60.00 mL/min  MICROALBUMIN / CREATININE URINE RATIO      Result Value Ref Range   Microalb, Ur 18.2 (*) 0.0 - 1.9 mg/dL   Creatinine,U 21.9     Microalb Creat Ratio 83.3 (*) 0.0 - 30.0 mg/g  PSA, MEDICARE      Result Value Ref Range   PSA 0.74  0.10 - 4.00 ng/ml      Assessment & Plan:   Problem List Items Addressed This Visit   Obesity     Continue to encourage weight loss through healthy diet and lifestyle.    HTN (hypertension)     Chronic, stable. Continue regimen.    HLD (hyperlipidemia)     Chronic ,stable. Encouraged increased aerobic  exercise to raise HDL. LDL at goal.    Health maintenance examination - Primary     Preventative protocols reviewed and updated unless pt declined. Discussed healthy diet and lifestyle.    Controlled type 2 diabetes mellitus with diabetic nephropathy      Chronic, stable. Continue metformin regimen. Encouraged he remember to take bid (tends to forget PM dose). Lab Results  Component Value Date   HGBA1C 6.8* 02/16/2014      Chronic kidney disease (CKD), stage I     Reviewed with patient - continue enalapril.        Follow up plan: Return in about 6  months (around 08/22/2014), or as needed, for follow up visit.

## 2014-08-17 ENCOUNTER — Other Ambulatory Visit: Payer: Self-pay | Admitting: Family Medicine

## 2014-08-22 ENCOUNTER — Ambulatory Visit: Payer: PRIVATE HEALTH INSURANCE | Admitting: Family Medicine

## 2015-05-19 ENCOUNTER — Other Ambulatory Visit: Payer: Self-pay | Admitting: Family Medicine

## 2015-06-20 ENCOUNTER — Encounter: Payer: Self-pay | Admitting: Internal Medicine

## 2015-07-12 ENCOUNTER — Telehealth: Payer: Self-pay

## 2015-07-12 ENCOUNTER — Other Ambulatory Visit: Payer: Self-pay | Admitting: Family Medicine

## 2015-07-12 DIAGNOSIS — E1121 Type 2 diabetes mellitus with diabetic nephropathy: Secondary | ICD-10-CM

## 2015-07-12 DIAGNOSIS — N181 Chronic kidney disease, stage 1: Secondary | ICD-10-CM

## 2015-07-12 DIAGNOSIS — Z125 Encounter for screening for malignant neoplasm of prostate: Secondary | ICD-10-CM

## 2015-07-12 DIAGNOSIS — E785 Hyperlipidemia, unspecified: Secondary | ICD-10-CM

## 2015-07-12 DIAGNOSIS — I10 Essential (primary) hypertension: Secondary | ICD-10-CM

## 2015-07-12 NOTE — Telephone Encounter (Signed)
Attempted to reach patient to discuss AWV 

## 2015-07-13 ENCOUNTER — Other Ambulatory Visit (INDEPENDENT_AMBULATORY_CARE_PROVIDER_SITE_OTHER): Payer: PRIVATE HEALTH INSURANCE

## 2015-07-13 DIAGNOSIS — N181 Chronic kidney disease, stage 1: Secondary | ICD-10-CM

## 2015-07-13 DIAGNOSIS — Z125 Encounter for screening for malignant neoplasm of prostate: Secondary | ICD-10-CM

## 2015-07-13 DIAGNOSIS — E785 Hyperlipidemia, unspecified: Secondary | ICD-10-CM

## 2015-07-13 DIAGNOSIS — E1121 Type 2 diabetes mellitus with diabetic nephropathy: Secondary | ICD-10-CM

## 2015-07-13 DIAGNOSIS — I10 Essential (primary) hypertension: Secondary | ICD-10-CM | POA: Diagnosis not present

## 2015-07-13 LAB — BASIC METABOLIC PANEL
BUN: 17 mg/dL (ref 6–23)
CO2: 28 mEq/L (ref 19–32)
Calcium: 9.7 mg/dL (ref 8.4–10.5)
Chloride: 105 mEq/L (ref 96–112)
Creatinine, Ser: 0.89 mg/dL (ref 0.40–1.50)
GFR: 90.76 mL/min (ref >60.00)
Glucose, Bld: 130 mg/dL — ABNORMAL HIGH (ref 70–99)
Potassium: 4.3 mEq/L (ref 3.5–5.1)
Sodium: 141 mEq/L (ref 135–145)

## 2015-07-13 LAB — LIPID PANEL
Cholesterol: 167 mg/dL (ref 0–200)
HDL: 50.4 mg/dL (ref >39.00)
LDL Cholesterol: 93 mg/dL (ref 0–99)
NonHDL: 116.48
Total CHOL/HDL Ratio: 3
Triglycerides: 115 mg/dL (ref 0.0–149.0)
VLDL: 23 mg/dL (ref 0.0–40.0)

## 2015-07-13 LAB — PSA: PSA: 0.87 ng/mL (ref 0.10–4.00)

## 2015-07-13 LAB — HEMOGLOBIN A1C: Hgb A1c MFr Bld: 5.9 % (ref 4.6–6.5)

## 2015-07-13 LAB — MICROALBUMIN / CREATININE URINE RATIO
Creatinine,U: 77.2 mg/dL
Microalb Creat Ratio: 54.8 mg/g — ABNORMAL HIGH (ref 0.0–30.0)
Microalb, Ur: 42.3 mg/dL — ABNORMAL HIGH (ref 0.0–1.9)

## 2015-07-20 ENCOUNTER — Ambulatory Visit (INDEPENDENT_AMBULATORY_CARE_PROVIDER_SITE_OTHER): Payer: PRIVATE HEALTH INSURANCE | Admitting: Family Medicine

## 2015-07-20 ENCOUNTER — Encounter: Payer: Self-pay | Admitting: Family Medicine

## 2015-07-20 VITALS — BP 152/90 | HR 85 | Temp 98.3°F | Ht 67.0 in | Wt 227.2 lb

## 2015-07-20 DIAGNOSIS — Z0000 Encounter for general adult medical examination without abnormal findings: Secondary | ICD-10-CM | POA: Diagnosis not present

## 2015-07-20 DIAGNOSIS — Z23 Encounter for immunization: Secondary | ICD-10-CM

## 2015-07-20 DIAGNOSIS — E785 Hyperlipidemia, unspecified: Secondary | ICD-10-CM

## 2015-07-20 DIAGNOSIS — Z6835 Body mass index (BMI) 35.0-35.9, adult: Secondary | ICD-10-CM

## 2015-07-20 DIAGNOSIS — E1122 Type 2 diabetes mellitus with diabetic chronic kidney disease: Secondary | ICD-10-CM

## 2015-07-20 DIAGNOSIS — Z7189 Other specified counseling: Secondary | ICD-10-CM | POA: Insufficient documentation

## 2015-07-20 DIAGNOSIS — E1121 Type 2 diabetes mellitus with diabetic nephropathy: Secondary | ICD-10-CM

## 2015-07-20 DIAGNOSIS — N181 Chronic kidney disease, stage 1: Secondary | ICD-10-CM

## 2015-07-20 DIAGNOSIS — I10 Essential (primary) hypertension: Secondary | ICD-10-CM

## 2015-07-20 MED ORDER — ENALAPRIL MALEATE 20 MG PO TABS: ORAL_TABLET | ORAL | Status: DC

## 2015-07-20 MED ORDER — METFORMIN HCL 500 MG PO TABS: ORAL_TABLET | ORAL | Status: DC

## 2015-07-20 MED ORDER — SIMVASTATIN 40 MG PO TABS: ORAL_TABLET | ORAL | Status: DC

## 2015-07-20 NOTE — Assessment & Plan Note (Signed)
Above goal. Increase enalapril to 20mg  daily. RTC 10d Cr/K chcek.

## 2015-07-20 NOTE — Addendum Note (Signed)
Addended by: Tammi Sou on: 07/20/2015 12:24 PM   Modules accepted: Orders

## 2015-07-20 NOTE — Assessment & Plan Note (Signed)
Congratulated on weight loss noted. Pt motivated to continue healthy diet and lifestyle changes.

## 2015-07-20 NOTE — Progress Notes (Signed)
BP 152/90 mmHg  Pulse 85  Temp(Src) 98.3 F (36.8 C) (Oral)  Ht 5\' 7"  (1.702 m)  Wt 227 lb 4 oz (103.08 kg)  BMI 35.58 kg/m2  SpO2 95%   CC: CPE - does not have medicare  Subjective:    Patient ID: James Robbins, male    DOB: 25-Sep-1950, 65 y.o.   MRN: [id number]  HPI: James Robbins is a 65 y.o. male presenting on 07/20/2015 for Medicare Wellness   Has medicare part A only.  bp elevated today.  DM - doesn't check sugars. Takes metformin 500mg  bid without GI side effects. Some confusion on diet.  Diabetic Foot Exam - Simple   Simple Foot Form  Diabetic Foot exam was performed with the following findings:  Yes 07/20/2015 12:18 PM  Visual Inspection  No deformities, no ulcerations, no other skin breakdown bilaterally:  Yes  Sensation Testing  Intact to touch and monofilament testing bilaterally:  Yes  Pulse Check  Posterior Tibialis and Dorsalis pulse intact bilaterally:  Yes  Comments      HLD - stable on zocor daily.   Preventative: Colon cancer screening - colonoscopy 2006 normal. Denies BM changes. No blood in stool. Last colonoscopy woke up early. Leery of colonoscopies but desires scheduled for later in the year.  Prostate cancer screening - Discussed. Would like to continue.  Flu - through city yearly prevnar - 02/2014, pneumovax today zostavax 2012  Advanced directives - does not have set up. Would want wife to be HCPOA.  Packet provided today.  Seat belt use discussed Sunscreen use discussed - no changing moles  Lives with wife and children, no pets  Occupation: retired Chemical engineer  Edu: HS  Activity: no regular exercise  Diet: good water, fruits/vegetables daily, stopped sodas  Relevant past medical, surgical, family and social history reviewed and updated as indicated. Interim medical history since our last visit reviewed. Allergies and medications reviewed and updated. Current Outpatient Prescriptions on File Prior to Visit  Medication Sig  . aspirin  81 MG tablet Take 81 mg by mouth daily.  . cetirizine (ZYRTEC) 10 MG tablet Take 10 mg by mouth daily.   No current facility-administered medications on file prior to visit.    Review of Systems  Constitutional: Negative for fever, chills, activity change, appetite change, fatigue and unexpected weight change.  HENT: Negative for hearing loss.   Eyes: Negative for visual disturbance.  Respiratory: Negative for cough, chest tightness, shortness of breath and wheezing.   Cardiovascular: Negative for chest pain, palpitations and leg swelling.  Gastrointestinal: Negative for nausea, vomiting, abdominal pain, diarrhea, constipation, blood in stool and abdominal distention.  Genitourinary: Negative for hematuria and difficulty urinating.  Musculoskeletal: Negative for myalgias, arthralgias and neck pain.  Skin: Negative for rash.  Neurological: Negative for dizziness, seizures, syncope and headaches.  Hematological: Negative for adenopathy. Does not bruise/bleed easily.  Psychiatric/Behavioral: Negative for dysphoric mood. The patient is not nervous/anxious.    Per HPI unless specifically indicated in ROS section     Objective:    BP 152/90 mmHg  Pulse 85  Temp(Src) 98.3 F (36.8 C) (Oral)  Ht 5\' 7"  (1.702 m)  Wt 227 lb 4 oz (103.08 kg)  BMI 35.58 kg/m2  SpO2 95%  Wt Readings from Last 3 Encounters:  07/20/15 227 lb 4 oz (103.08 kg)  02/21/14 241 lb 4 oz (109.43 kg)  08/30/13 244 lb 8 oz (110.904 kg)    Physical Exam  Constitutional: He is oriented  to person, place, and time. He appears well-developed and well-nourished. No distress.  HENT:  Head: Normocephalic and atraumatic.  Right Ear: Hearing, tympanic membrane, external ear and ear canal normal.  Left Ear: Hearing, tympanic membrane, external ear and ear canal normal.  Nose: Nose normal.  Mouth/Throat: Uvula is midline, oropharynx is clear and moist and mucous membranes are normal. No oropharyngeal exudate, posterior  oropharyngeal edema or posterior oropharyngeal erythema.  Eyes: Conjunctivae and EOM are normal. Pupils are equal, round, and reactive to light. No scleral icterus.  Neck: Normal range of motion. Neck supple. Carotid bruit is not present. No thyromegaly present.  Cardiovascular: Normal rate, regular rhythm, normal heart sounds and intact distal pulses.   No murmur heard. Pulses:      Radial pulses are 2+ on the right side, and 2+ on the left side.  Pulmonary/Chest: Effort normal and breath sounds normal. No respiratory distress. He has no wheezes. He has no rales.  Abdominal: Soft. Bowel sounds are normal. He exhibits no distension and no mass. There is no tenderness. There is no rebound and no guarding.  Genitourinary: Rectum normal and prostate normal. Rectal exam shows no external hemorrhoid, no internal hemorrhoid, no fissure, no mass, no tenderness and anal tone normal. Prostate is not enlarged (20gm) and not tender.  Musculoskeletal: Normal range of motion. He exhibits no edema.  Lymphadenopathy:    He has no cervical adenopathy.  Neurological: He is alert and oriented to person, place, and time.  CN grossly intact, station and gait intact  Skin: Skin is warm and dry. No rash noted.  Psychiatric: He has a normal mood and affect. His behavior is normal. Judgment and thought content normal.  Nursing note and vitals reviewed.  Results for orders placed or performed in visit on 07/13/15  Microalbumin / creatinine urine ratio  Result Value Ref Range   Microalb, Ur 42.3 (H) 0.0 - 1.9 mg/dL   Creatinine,U 77.2 mg/dL   Microalb Creat Ratio 54.8 (H) 0.0 - 30.0 mg/g  Lipid panel  Result Value Ref Range   Cholesterol 167 0 - 200 mg/dL   Triglycerides 115.0 0.0 - 149.0 mg/dL   HDL 50.40 >39.00 mg/dL   VLDL 23.0 0.0 - 40.0 mg/dL   LDL Cholesterol 93 0 - 99 mg/dL   Total CHOL/HDL Ratio 3    NonHDL 116.48   Hemoglobin A1c  Result Value Ref Range   Hgb A1c MFr Bld 5.9 4.6 - 6.5 %  PSA    Result Value Ref Range   PSA 0.87 0.10 - 4.00 ng/mL  Basic metabolic panel  Result Value Ref Range   Sodium 141 135 - 145 mEq/L   Potassium 4.3 3.5 - 5.1 mEq/L   Chloride 105 96 - 112 mEq/L   CO2 28 19 - 32 mEq/L   Glucose, Bld 130 (H) 70 - 99 mg/dL   BUN 17 6 - 23 mg/dL   Creatinine, Ser 0.89 0.40 - 1.50 mg/dL   Calcium 9.7 8.4 - 10.5 mg/dL   GFR 90.76 >60.00 mL/min      Assessment & Plan:   Problem List Items Addressed This Visit    HTN (hypertension)    Above goal. Increase enalapril to 20mg  daily. RTC 10d Cr/K chcek.      Relevant Medications   enalapril (VASOTEC) 20 MG tablet   simvastatin (ZOCOR) 40 MG tablet   Other Relevant Orders   Creatinine, serum   Potassium   HLD (hyperlipidemia)    Chronic, stable. Continue  simvastatin 40mg  daily.      Relevant Medications   enalapril (VASOTEC) 20 MG tablet   simvastatin (ZOCOR) 40 MG tablet   Health maintenance examination - Primary    Preventative protocols reviewed and updated unless pt declined. Discussed healthy diet and lifestyle.  Requests colonoscopy referral later in the year. Denies blood in stool.      Controlled type 2 diabetes mellitus with diabetic nephropathy (HCC)    Chronic, stable. Refer to dm education. Foot exam today. Rec schedule eye exam. rtc 6 mo DM f/u. Great control to date.      Relevant Medications   enalapril (VASOTEC) 20 MG tablet   simvastatin (ZOCOR) 40 MG tablet   metFORMIN (GLUCOPHAGE) 500 MG tablet   Other Relevant Orders   Ambulatory referral to diabetic education   CKD stage 1 due to type 2 diabetes mellitus ([place])    Reviewed with patient. GFR 90, but microalbuminuria present. Increase benazepril to 20mg  daily      Relevant Medications   enalapril (VASOTEC) 20 MG tablet   simvastatin (ZOCOR) 40 MG tablet   metFORMIN (GLUCOPHAGE) 500 MG tablet   Body mass index (BMI) of 35.0 to 35.9 with comorbidity ([place])    Congratulated on weight loss noted. Pt motivated to continue  healthy diet and lifestyle changes.      Advanced care planning/counseling discussion    Advanced directives - does not have set up. Would want wife to be HCPOA.  Packet provided today.           Follow up plan: Return in about 6 months (around 01/17/2016), or as needed, for follow up visit.

## 2015-07-20 NOTE — Assessment & Plan Note (Signed)
Chronic, stable. Refer to dm education. Foot exam today. Rec schedule eye exam. rtc 6 mo DM f/u. Great control to date.

## 2015-07-20 NOTE — Patient Instructions (Addendum)
Pneumovax today. Increase benazepril to 20mg  daily. May double up on current dose until you run out. Return in 10 days for lab visit only.  We will refer you for diabetes education [facility]).  Advanced directive packet provided today. Great job with weight loss! Schedule eye exam as you're due. Return as needed or in 6 months for diabetes follow up   Diabetes Mellitus and Food It is important for you to manage your blood sugar (glucose) level. Your blood glucose level can be greatly affected by what you eat. Eating healthier foods in the appropriate amounts throughout the day at about the same time each day will help you control your blood glucose level. It can also help slow or prevent worsening of your diabetes mellitus. Healthy eating may even help you improve the level of your blood pressure and reach or maintain a healthy weight.  General recommendations for healthful eating and cooking habits include:  Eating meals and snacks regularly. Avoid going long periods of time without eating to lose weight.  Eating a diet that consists mainly of plant-based foods, such as fruits, vegetables, nuts, legumes, and whole grains.  Using low-heat cooking methods, such as baking, instead of high-heat cooking methods, such as deep frying. Work with your dietitian to make sure you understand how to use the Nutrition Facts information on food labels. HOW CAN FOOD AFFECT ME? Carbohydrates Carbohydrates affect your blood glucose level more than any other type of food. Your dietitian will help you determine how many carbohydrates to eat at each meal and teach you how to count carbohydrates. Counting carbohydrates is important to keep your blood glucose at a healthy level, especially if you are using insulin or taking certain medicines for diabetes mellitus. Alcohol Alcohol can cause sudden decreases in blood glucose (hypoglycemia), especially if you use insulin or take certain medicines for diabetes mellitus.  Hypoglycemia can be a life-threatening condition. Symptoms of hypoglycemia (sleepiness, dizziness, and disorientation) are similar to symptoms of having too much alcohol.  If your health care provider has given you approval to drink alcohol, do so in moderation and use the following guidelines:  Women should not have more than one drink per day, and men should not have more than two drinks per day. One drink is equal to:  12 oz of beer.  5 oz of wine.  1 oz of hard liquor.  Do not drink on an empty stomach.  Keep yourself hydrated. Have water, diet soda, or unsweetened iced tea.  Regular soda, juice, and other mixers might contain a lot of carbohydrates and should be counted. WHAT FOODS ARE NOT RECOMMENDED? As you make food choices, it is important to remember that all foods are not the same. Some foods have fewer nutrients per serving than other foods, even though they might have the same number of calories or carbohydrates. It is difficult to get your body what it needs when you eat foods with fewer nutrients. Examples of foods that you should avoid that are high in calories and carbohydrates but low in nutrients include:  Trans fats (most processed foods list trans fats on the Nutrition Facts label).  Regular soda.  Juice.  Candy.  Sweets, such as cake, pie, doughnuts, and cookies.  Fried foods. WHAT FOODS CAN I EAT? Eat nutrient-rich foods, which will nourish your body and keep you healthy. The food you should eat also will depend on several factors, including:  The calories you need.  The medicines you take.  Your weight.  Your blood glucose level.  Your blood pressure level.  Your cholesterol level. You should eat a variety of foods, including:  Protein.  Lean cuts of meat.  Proteins low in saturated fats, such as fish, egg whites, and beans. Avoid processed meats.  Fruits and vegetables.  Fruits and vegetables that may help control blood glucose levels,  such as apples, mangoes, and yams.  Dairy products.  Choose fat-free or low-fat dairy products, such as milk, yogurt, and cheese.  Grains, bread, pasta, and rice.  Choose whole grain products, such as multigrain bread, whole oats, and brown rice. These foods may help control blood pressure.  Fats.  Foods containing healthful fats, such as nuts, avocado, olive oil, canola oil, and fish. DOES EVERYONE WITH DIABETES MELLITUS HAVE THE SAME MEAL PLAN? Because every person with diabetes mellitus is different, there is not one meal plan that works for everyone. It is very important that you meet with a dietitian who will help you create a meal plan that is just right for you.   This information is not intended to replace advice given to you by your health care provider. Make sure you discuss any questions you have with your health care provider.   Document Released: 02/21/2005 Document Revised: 06/17/2014 Document Reviewed: 04/23/2013 Elsevier Interactive Patient Education 2016 [place] Maintenance, Male A healthy lifestyle and preventative care can promote health and wellness.  Maintain regular health, dental, and eye exams.  Eat a healthy diet. Foods like vegetables, fruits, whole grains, low-fat dairy products, and lean protein foods contain the nutrients you need and are low in calories. Decrease your intake of foods high in solid fats, added sugars, and salt. Get information about a proper diet from your health care provider, if necessary.  Regular physical exercise is one of the most important things you can do for your health. Most adults should get at least 150 minutes of moderate-intensity exercise (any activity that increases your heart rate and causes you to sweat) each week. In addition, most adults need muscle-strengthening exercises on 2 or more days a week.   Maintain a healthy weight. The body mass index (BMI) is a screening tool to identify possible weight  problems. It provides an estimate of body fat based on height and weight. Your health care provider can find your BMI and can help you achieve or maintain a healthy weight. For males 20 years and older:  A BMI below 18.5 is considered underweight.  A BMI of 18.5 to 24.9 is normal.  A BMI of 25 to 29.9 is considered overweight.  A BMI of 30 and above is considered obese.  Maintain normal blood lipids and cholesterol by exercising and minimizing your intake of saturated fat. Eat a balanced diet with plenty of fruits and vegetables. Blood tests for lipids and cholesterol should begin at age 24 and be repeated every 5 years. If your lipid or cholesterol levels are high, you are over age 81, or you are at high risk for heart disease, you may need your cholesterol levels checked more frequently.Ongoing high lipid and cholesterol levels should be treated with medicines if diet and exercise are not working.  If you smoke, find out from your health care provider how to quit. If you do not use tobacco, do not start.  Lung cancer screening is recommended for adults aged 38-80 years who are at high risk for developing lung cancer because of a history of smoking. A yearly low-dose CT scan of  the lungs is recommended for people who have at least a 30-pack-year history of smoking and are current smokers or have quit within the past 15 years. A pack year of smoking is smoking an average of 1 pack of cigarettes a day for 1 year (for example, a 30-pack-year history of smoking could mean smoking 1 pack a day for 30 years or 2 packs a day for 15 years). Yearly screening should continue until the smoker has stopped smoking for at least 15 years. Yearly screening should be stopped for people who develop a health problem that would prevent them from having lung cancer treatment.  If you choose to drink alcohol, do not have more than 2 drinks per day. One drink is considered to be 12 oz (360 mL) of beer, 5 oz (150 mL) of  wine, or 1.5 oz (45 mL) of liquor.  Avoid the use of street drugs. Do not share needles with anyone. Ask for help if you need support or instructions about stopping the use of drugs.  High blood pressure causes heart disease and increases the risk of stroke. High blood pressure is more likely to develop in:  People who have blood pressure in the end of the normal range (100-139/85-89 mm Hg).  People who are overweight or obese.  People who are African American.  If you are 56-59 years of age, have your blood pressure checked every 3-5 years. If you are 73 years of age or older, have your blood pressure checked every year. You should have your blood pressure measured twice--once when you are at a hospital or clinic, and once when you are not at a hospital or clinic. Record the average of the two measurements. To check your blood pressure when you are not at a hospital or clinic, you can use:  An automated blood pressure machine at a pharmacy.  A home blood pressure monitor.  If you are 68-40 years old, ask your health care provider if you should take aspirin to prevent heart disease.  Diabetes screening involves taking a blood sample to check your fasting blood sugar level. This should be done once every 3 years after age 79 if you are at a normal weight and without risk factors for diabetes. Testing should be considered at a younger age or be carried out more frequently if you are overweight and have at least 1 risk factor for diabetes.  Colorectal cancer can be detected and often prevented. Most routine colorectal cancer screening begins at the age of 63 and continues through age 43. However, your health care provider may recommend screening at an earlier age if you have risk factors for colon cancer. On a yearly basis, your health care provider may provide home test kits to check for hidden blood in the stool. A small camera at the end of a tube may be used to directly examine the colon  (sigmoidoscopy or colonoscopy) to detect the earliest forms of colorectal cancer. Talk to your health care provider about this at age 11 when routine screening begins. A direct exam of the colon should be repeated every 5-10 years through age 41, unless early forms of precancerous polyps or small growths are found.  People who are at an increased risk for hepatitis B should be screened for this virus. You are considered at high risk for hepatitis B if:  You were born in a country where hepatitis B occurs often. Talk with your health care provider about which countries are considered high  risk.  Your parents were born in a high-risk country and you have not received a shot to protect against hepatitis B (hepatitis B vaccine).  You have HIV or AIDS.  You use needles to inject street drugs.  You live with, or have sex with, someone who has hepatitis B.  You are a man who has sex with other men (MSM).  You get hemodialysis treatment.  You take certain medicines for conditions like cancer, organ transplantation, and autoimmune conditions.  Hepatitis C blood testing is recommended for all people born from [birth year] through 1967 and any individual with known risk factors for hepatitis C.  Healthy men should no longer receive prostate-specific antigen (PSA) blood tests as part of routine cancer screening. Talk to your health care provider about prostate cancer screening.  Testicular cancer screening is not recommended for adolescents or adult males who have no symptoms. Screening includes self-exam, a health care provider exam, and other screening tests. Consult with your health care provider about any symptoms you have or any concerns you have about testicular cancer.  Practice safe sex. Use condoms and avoid high-risk sexual practices to reduce the spread of sexually transmitted infections (STIs).  You should be screened for STIs, including gonorrhea and chlamydia if:  You are sexually active and  are younger than 24 years.  You are older than 24 years, and your health care provider tells you that you are at risk for this type of infection.  Your sexual activity has changed since you were last screened, and you are at an increased risk for chlamydia or gonorrhea. Ask your health care provider if you are at risk.  If you are at risk of being infected with HIV, it is recommended that you take a prescription medicine daily to prevent HIV infection. This is called pre-exposure prophylaxis (PrEP). You are considered at risk if:  You are a man who has sex with other men (MSM).  You are a heterosexual man who is sexually active with multiple partners.  You take drugs by injection.  You are sexually active with a partner who has HIV.  Talk with your health care provider about whether you are at high risk of being infected with HIV. If you choose to begin PrEP, you should first be tested for HIV. You should then be tested every 3 months for as long as you are taking PrEP.  Use sunscreen. Apply sunscreen liberally and repeatedly throughout the day. You should seek shade when your shadow is shorter than you. Protect yourself by wearing long sleeves, pants, a wide-brimmed hat, and sunglasses year round whenever you are outdoors.  Tell your health care provider of new moles or changes in moles, especially if there is a change in shape or color. Also, tell your health care provider if a mole is larger than the size of a pencil eraser.  A one-time screening for abdominal aortic aneurysm (AAA) and surgical repair of large AAAs by ultrasound is recommended for men aged 34-75 years who are current or former smokers.  Stay current with your vaccines (immunizations).   This information is not intended to replace advice given to you by your health care provider. Make sure you discuss any questions you have with your health care provider.   Document Released: 11/23/2007 Document Revised: 06/17/2014  Document Reviewed: 10/22/2010 Elsevier Interactive Patient Education [employer].

## 2015-07-20 NOTE — Progress Notes (Signed)
Pre visit review using our clinic review tool, if applicable. No additional management support is needed unless otherwise documented below in the visit note. 

## 2015-07-20 NOTE — Assessment & Plan Note (Signed)
Advanced directives - does not have set up. Would want wife to be HCPOA.  Packet provided today.

## 2015-07-20 NOTE — Assessment & Plan Note (Addendum)
Reviewed with patient. GFR 90, but microalbuminuria present. Increase benazepril to 20mg  daily

## 2015-07-20 NOTE — Assessment & Plan Note (Signed)
Chronic, stable. Continue simvastatin 40mg daily.  

## 2015-07-20 NOTE — Assessment & Plan Note (Addendum)
Preventative protocols reviewed and updated unless pt declined. Discussed healthy diet and lifestyle.  Requests colonoscopy referral later in the year. Denies blood in stool.

## 2015-07-28 ENCOUNTER — Encounter: Payer: Self-pay | Admitting: Internal Medicine

## 2015-07-31 ENCOUNTER — Other Ambulatory Visit (INDEPENDENT_AMBULATORY_CARE_PROVIDER_SITE_OTHER): Payer: PRIVATE HEALTH INSURANCE

## 2015-07-31 DIAGNOSIS — I10 Essential (primary) hypertension: Secondary | ICD-10-CM | POA: Diagnosis not present

## 2015-07-31 LAB — POTASSIUM: Potassium: 4 mEq/L (ref 3.5–5.1)

## 2015-07-31 LAB — CREATININE, SERUM: Creatinine, Ser: 0.87 mg/dL (ref 0.40–1.50)

## 2015-08-18 ENCOUNTER — Other Ambulatory Visit: Payer: Self-pay | Admitting: Family Medicine

## 2016-01-18 ENCOUNTER — Encounter: Payer: Self-pay | Admitting: Family Medicine

## 2016-01-18 ENCOUNTER — Ambulatory Visit (INDEPENDENT_AMBULATORY_CARE_PROVIDER_SITE_OTHER): Payer: PRIVATE HEALTH INSURANCE | Admitting: Family Medicine

## 2016-01-18 VITALS — BP 140/78 | HR 104 | Wt 246.0 lb

## 2016-01-18 DIAGNOSIS — E1121 Type 2 diabetes mellitus with diabetic nephropathy: Secondary | ICD-10-CM | POA: Diagnosis not present

## 2016-01-18 DIAGNOSIS — E785 Hyperlipidemia, unspecified: Secondary | ICD-10-CM

## 2016-01-18 DIAGNOSIS — Z6835 Body mass index (BMI) 35.0-35.9, adult: Secondary | ICD-10-CM

## 2016-01-18 DIAGNOSIS — I10 Essential (primary) hypertension: Secondary | ICD-10-CM

## 2016-01-18 NOTE — Assessment & Plan Note (Signed)
Chronic, stable. With weight gain concern for deterioration in glycemic control. Pt does not have glucose meter at home and declines Rx for one at this time.  No med changes today, A1c will guide further treatment. Discussed importance of weight loss for improved glycemic control. Completed DSME.

## 2016-01-18 NOTE — Assessment & Plan Note (Signed)
Discussed noted 19 lb weight gain over last 6 months - pt attributes to increased cook outs with family. Motivated to improve diet for goal weight loss.

## 2016-01-18 NOTE — Assessment & Plan Note (Signed)
Check dLDL today. 

## 2016-01-18 NOTE — Progress Notes (Signed)
BP 140/78   Pulse (!) 104   Wt 246 lb (111.6 kg)   SpO2 97%   BMI 38.53 kg/m    CC: DM f/u visit Subjective:    Patient ID: James Robbins, male    DOB: [birth year], 65 y.o.   MRN: [id number]  HPI: James Robbins is a 65 y.o. male presenting on 01/18/2016 for Follow-up (diabetes follow up, pt states that he's been taking metforming once a day instead of BID ) and Medication Refill (METFORMIN)   DM - does not regularly check sugars. Declines glucose meter Rx. Compliant with antihyperglycemic regimen which includes: metformin 500mg  once daily (has not been taking bid dosing). Denies low sugars or hypoglycemic symptoms. Denies paresthesias. Last diabetic eye exam DUE.  Pneumovax: 2015.  Prevnar: 2017. Did go to diabetes classes Lab Results  Component Value Date   HGBA1C 5.9 07/13/2015   Diabetic Foot Exam - Simple   No data filed    19 lb weight gain over last 6 months.   HTN - Compliant with current antihypertensive regimen of enalapril 20mg  daily.  Does not check blood pressures at home. No low blood pressure readings or symptoms of dizziness/syncope. Denies HA, vision changes, CP/tightness, SOB, leg swelling.   HLD - complaint with simvastatin 40mg  nightly without myalgias.   Relevant past medical, surgical, family and social history reviewed and updated as indicated. Interim medical history since our last visit reviewed. Allergies and medications reviewed and updated. Current Outpatient Prescriptions on File Prior to Visit  Medication Sig  . aspirin 81 MG tablet Take 81 mg by mouth daily.  . cetirizine (ZYRTEC) 10 MG tablet Take 10 mg by mouth daily.  . enalapril (VASOTEC) 20 MG tablet Take one tablet daily  . metFORMIN (GLUCOPHAGE) 500 MG tablet Take one tablet twice a day with meals  . simvastatin (ZOCOR) 40 MG tablet Take one tablet at bedtime   No current facility-administered medications on file prior to visit.     Review of Systems Per HPI unless specifically  indicated in ROS section     Objective:    BP 140/78   Pulse (!) 104   Wt 246 lb (111.6 kg)   SpO2 97%   BMI 38.53 kg/m   Wt Readings from Last 3 Encounters:  01/18/16 246 lb (111.6 kg)  07/20/15 227 lb 4 oz (103.1 kg)  02/21/14 241 lb 4 oz (109.4 kg)    Physical Exam  Constitutional: He appears well-developed and well-nourished. No distress.  HENT:  Head: Normocephalic and atraumatic.  Right Ear: External ear normal.  Left Ear: External ear normal.  Nose: Nose normal.  Mouth/Throat: Oropharynx is clear and moist. No oropharyngeal exudate.  Eyes: Conjunctivae and EOM are normal. Pupils are equal, round, and reactive to light. No scleral icterus.  Neck: Normal range of motion. Neck supple.  Cardiovascular: Normal rate, regular rhythm, normal heart sounds and intact distal pulses.   No murmur heard. Pulmonary/Chest: Effort normal and breath sounds normal. No respiratory distress. He has no wheezes. He has no rales.  Musculoskeletal: He exhibits no edema.  See HPI for foot exam if done  Lymphadenopathy:    He has no cervical adenopathy.  Skin: Skin is warm and dry. No rash noted.  Psychiatric: He has a normal mood and affect.  Nursing note and vitals reviewed.  Results for orders placed or performed in visit on 07/31/15  Creatinine, serum  Result Value Ref Range   Creatinine, Ser 0.87 0.40 - 1.50  mg/dL  Potassium  Result Value Ref Range   Potassium 4.0 3.5 - 5.1 mEq/L   Lab Results  Component Value Date   HGBA1C 5.9 07/13/2015       Assessment & Plan:   Problem List Items Addressed This Visit    Body mass index (BMI) of 35.0 to 35.9 with comorbidity (HCC)    Discussed noted 19 lb weight gain over last 6 months - pt attributes to increased cook outs with family. Motivated to improve diet for goal weight loss.      Controlled type 2 diabetes mellitus with diabetic nephropathy (HCC) - Primary    Chronic, stable. With weight gain concern for deterioration in  glycemic control. Pt does not have glucose meter at home and declines Rx for one at this time.  No med changes today, A1c will guide further treatment. Discussed importance of weight loss for improved glycemic control. Completed DSME.      Relevant Orders   Hemoglobin 123456   Basic metabolic panel   HLD (hyperlipidemia)    Check dLDL today.       Relevant Orders   LDL Cholesterol, Direct   HTN (hypertension)    Chronic, stable. Continue enalapril 20mg  daily.       Relevant Orders   Basic metabolic panel    Other Visit Diagnoses   None.      Follow up plan: Return in about 6 months (around 07/20/2016) for annual exam, prior fasting for blood work.  Ria Bush, MD

## 2016-01-18 NOTE — Progress Notes (Signed)
Pre visit review using our clinic review tool, if applicable. No additional management support is needed unless otherwise documented below in the visit note. 

## 2016-01-18 NOTE — Patient Instructions (Addendum)
Schedule eye exam as you're due.  labwork today. We will be in touch with results and plan.  Return as needed or in 6 months for physical.

## 2016-01-18 NOTE — Assessment & Plan Note (Signed)
Chronic, stable. Continue enalapril 20mg  daily.

## 2016-01-19 LAB — BASIC METABOLIC PANEL
BUN: 21 mg/dL (ref 6–23)
CO2: 31 mEq/L (ref 19–32)
Calcium: 9.9 mg/dL (ref 8.4–10.5)
Chloride: 102 mEq/L (ref 96–112)
Creatinine, Ser: 1.33 mg/dL (ref 0.40–1.50)
GFR: 57 mL/min — ABNORMAL LOW (ref >60.00)
Glucose, Bld: 115 mg/dL — ABNORMAL HIGH (ref 70–99)
Potassium: 4.3 mEq/L (ref 3.5–5.1)
Sodium: 140 mEq/L (ref 135–145)

## 2016-01-19 LAB — HEMOGLOBIN A1C: Hgb A1c MFr Bld: 6.7 % — ABNORMAL HIGH (ref 4.6–6.5)

## 2016-01-19 LAB — LDL CHOLESTEROL, DIRECT: Direct LDL: 108 mg/dL

## 2016-08-23 ENCOUNTER — Other Ambulatory Visit: Payer: Self-pay

## 2016-08-23 MED ORDER — ENALAPRIL MALEATE 20 MG PO TABS: ORAL_TABLET | ORAL | 0 refills | Status: DC

## 2016-08-23 NOTE — Telephone Encounter (Signed)
Left a message for his wife to call back. He needs an OV/CPE. I sent in his refill for 90 days.

## 2016-08-23 NOTE — Telephone Encounter (Signed)
Spoke to pt. He has an OV in April

## 2016-09-10 ENCOUNTER — Other Ambulatory Visit: Payer: Self-pay | Admitting: Family Medicine

## 2016-09-15 ENCOUNTER — Other Ambulatory Visit: Payer: Self-pay | Admitting: Family Medicine

## 2016-09-15 DIAGNOSIS — E1121 Type 2 diabetes mellitus with diabetic nephropathy: Secondary | ICD-10-CM

## 2016-09-15 DIAGNOSIS — E1122 Type 2 diabetes mellitus with diabetic chronic kidney disease: Secondary | ICD-10-CM

## 2016-09-15 DIAGNOSIS — N181 Chronic kidney disease, stage 1: Secondary | ICD-10-CM

## 2016-09-15 DIAGNOSIS — E785 Hyperlipidemia, unspecified: Secondary | ICD-10-CM

## 2016-09-15 DIAGNOSIS — Z125 Encounter for screening for malignant neoplasm of prostate: Secondary | ICD-10-CM

## 2016-09-18 ENCOUNTER — Other Ambulatory Visit (INDEPENDENT_AMBULATORY_CARE_PROVIDER_SITE_OTHER): Payer: PRIVATE HEALTH INSURANCE

## 2016-09-18 DIAGNOSIS — E1121 Type 2 diabetes mellitus with diabetic nephropathy: Secondary | ICD-10-CM | POA: Diagnosis not present

## 2016-09-18 DIAGNOSIS — E785 Hyperlipidemia, unspecified: Secondary | ICD-10-CM | POA: Diagnosis not present

## 2016-09-18 DIAGNOSIS — N181 Chronic kidney disease, stage 1: Secondary | ICD-10-CM

## 2016-09-18 DIAGNOSIS — E1122 Type 2 diabetes mellitus with diabetic chronic kidney disease: Secondary | ICD-10-CM

## 2016-09-18 DIAGNOSIS — Z125 Encounter for screening for malignant neoplasm of prostate: Secondary | ICD-10-CM

## 2016-09-18 LAB — RENAL FUNCTION PANEL
Albumin: 4.4 g/dL (ref 3.5–5.2)
BUN: 16 mg/dL (ref 6–23)
CO2: 27 mEq/L (ref 19–32)
Calcium: 9.7 mg/dL (ref 8.4–10.5)
Chloride: 101 mEq/L (ref 96–112)
Creatinine, Ser: 0.93 mg/dL (ref 0.40–1.50)
GFR: 85.96 mL/min (ref >60.00)
Glucose, Bld: 174 mg/dL — ABNORMAL HIGH (ref 70–99)
Phosphorus: 2.8 mg/dL (ref 2.3–4.6)
Potassium: 4.9 mEq/L (ref 3.5–5.1)
Sodium: 136 mEq/L (ref 135–145)

## 2016-09-18 LAB — PSA, MEDICARE: PSA: 0.79 ng/ml (ref 0.10–4.00)

## 2016-09-18 LAB — CBC WITH DIFFERENTIAL/PLATELET
Basophils Absolute: 0 10*3/uL (ref 0.0–0.1)
Basophils Relative: 0.6 % (ref 0.0–3.0)
Eosinophils Absolute: 0.2 10*3/uL (ref 0.0–0.7)
Eosinophils Relative: 1.9 % (ref 0.0–5.0)
HCT: 44.7 % (ref 39.0–52.0)
Hemoglobin: 15 g/dL (ref 13.0–17.0)
Lymphocytes Relative: 20.4 % (ref 12.0–46.0)
Lymphs Abs: 1.7 10*3/uL (ref 0.7–4.0)
MCHC: 33.6 g/dL (ref 30.0–36.0)
MCV: 87.2 fl (ref 78.0–100.0)
Monocytes Absolute: 0.6 10*3/uL (ref 0.1–1.0)
Monocytes Relative: 7.7 % (ref 3.0–12.0)
Neutro Abs: 5.7 10*3/uL (ref 1.4–7.7)
Neutrophils Relative %: 69.4 % (ref 43.0–77.0)
Platelets: 255 10*3/uL (ref 150.0–400.0)
RBC: 5.13 Mil/uL (ref 4.22–5.81)
RDW: 13.9 % (ref 11.5–15.5)
WBC: 8.3 10*3/uL (ref 4.0–10.5)

## 2016-09-18 LAB — LIPID PANEL
Cholesterol: 149 mg/dL (ref 0–200)
HDL: 35.1 mg/dL — ABNORMAL LOW (ref >39.00)
LDL Cholesterol: 86 mg/dL (ref 0–99)
NonHDL: 114.34
Total CHOL/HDL Ratio: 4
Triglycerides: 144 mg/dL (ref 0.0–149.0)
VLDL: 28.8 mg/dL (ref 0.0–40.0)

## 2016-09-18 LAB — VITAMIN D 25 HYDROXY (VIT D DEFICIENCY, FRACTURES): VITD: 14.84 ng/mL — ABNORMAL LOW (ref 30.00–100.00)

## 2016-09-18 LAB — HEMOGLOBIN A1C: Hgb A1c MFr Bld: 7.4 % — ABNORMAL HIGH (ref 4.6–6.5)

## 2016-09-23 ENCOUNTER — Encounter: Payer: Self-pay | Admitting: Family Medicine

## 2016-09-23 ENCOUNTER — Ambulatory Visit (INDEPENDENT_AMBULATORY_CARE_PROVIDER_SITE_OTHER): Payer: PRIVATE HEALTH INSURANCE | Admitting: Family Medicine

## 2016-09-23 VITALS — BP 160/90 | HR 84 | Temp 98.1°F | Ht 67.0 in | Wt 251.2 lb

## 2016-09-23 DIAGNOSIS — Z1211 Encounter for screening for malignant neoplasm of colon: Secondary | ICD-10-CM | POA: Diagnosis not present

## 2016-09-23 DIAGNOSIS — I10 Essential (primary) hypertension: Secondary | ICD-10-CM | POA: Diagnosis not present

## 2016-09-23 DIAGNOSIS — N181 Chronic kidney disease, stage 1: Secondary | ICD-10-CM | POA: Diagnosis not present

## 2016-09-23 DIAGNOSIS — E1122 Type 2 diabetes mellitus with diabetic chronic kidney disease: Secondary | ICD-10-CM | POA: Diagnosis not present

## 2016-09-23 DIAGNOSIS — Z0000 Encounter for general adult medical examination without abnormal findings: Secondary | ICD-10-CM | POA: Diagnosis not present

## 2016-09-23 DIAGNOSIS — E559 Vitamin D deficiency, unspecified: Secondary | ICD-10-CM

## 2016-09-23 DIAGNOSIS — E1121 Type 2 diabetes mellitus with diabetic nephropathy: Secondary | ICD-10-CM

## 2016-09-23 DIAGNOSIS — E785 Hyperlipidemia, unspecified: Secondary | ICD-10-CM | POA: Diagnosis not present

## 2016-09-23 MED ORDER — METFORMIN HCL 1000 MG PO TABS: 1000.0000 mg | ORAL_TABLET | Freq: Two times a day (BID) | ORAL | 3 refills | Status: DC

## 2016-09-23 MED ORDER — ENALAPRIL MALEATE 20 MG PO TABS: 20.0000 mg | ORAL_TABLET | Freq: Two times a day (BID) | ORAL | 3 refills | Status: DC

## 2016-09-23 MED ORDER — VITAMIN D3 25 MCG (1000 UT) PO CAPS: 1.0000 | ORAL_CAPSULE | Freq: Every day | ORAL | Status: DC

## 2016-09-23 NOTE — Assessment & Plan Note (Signed)
Chronic, stable with simvastatin on board - continue.

## 2016-09-23 NOTE — Assessment & Plan Note (Signed)
Chronic, uncontrolled. Will increase enalapril to 20mg  BID.

## 2016-09-23 NOTE — Patient Instructions (Addendum)
Pass by lab to pick up stool kit.  Start doing regular exercise (bike around neighborhood).  Sugar has increased to uncontrolled diabetes range - work on healthy diet changes (less simple carbs and sweets - like breads, rice, potatoes). Increase metformin to 2 tab in am and 1 tab in pm x 2 weeks then increase to 2 tablets twice daily. Watch for diarrhea or gi upset. Schedule eye exam at your convenience.  Start vitamin D 1000 units daily.  Return in 4 months for diabetes follow up visit.    Health Maintenance, Male A healthy lifestyle and preventive care is important for your health and wellness. Ask your health care provider about what schedule of regular examinations is right for you. What should I know about weight and diet?  Eat a Healthy Diet  Eat plenty of vegetables, fruits, whole grains, low-fat dairy products, and lean protein.  Do not eat a lot of foods high in solid fats, added sugars, or salt. Maintain a Healthy Weight  Regular exercise can help you achieve or maintain a healthy weight. You should:  Do at least 150 minutes of exercise each week. The exercise should increase your heart rate and make you sweat (moderate-intensity exercise).  Do strength-training exercises at least twice a week. Watch Your Levels of Cholesterol and Blood Lipids  Have your blood tested for lipids and cholesterol every 5 years starting at 68 years of age. If you are at high risk for heart disease, you should start having your blood tested when you are 68 years old. You may need to have your cholesterol levels checked more often if:  Your lipid or cholesterol levels are high.  You are older than 68 years of age.  You are at high risk for heart disease. What should I know about cancer screening? Many types of cancers can be detected early and may often be prevented. Lung Cancer  You should be screened every year for lung cancer if:  You are a current smoker who has smoked for at least 30  years.  You are a former smoker who has quit within the past 15 years.  Talk to your health care provider about your screening options, when you should start screening, and how often you should be screened. Colorectal Cancer  Routine colorectal cancer screening usually begins at 68 years of age and should be repeated every 5-10 years until you are 68 years old. You may need to be screened more often if early forms of precancerous polyps or small growths are found. Your health care provider may recommend screening at an earlier age if you have risk factors for colon cancer.  Your health care provider may recommend using home test kits to check for hidden blood in the stool.  A small camera at the end of a tube can be used to examine your colon (sigmoidoscopy or colonoscopy). This checks for the earliest forms of colorectal cancer. Prostate and Testicular Cancer  Depending on your age and overall health, your health care provider may do certain tests to screen for prostate and testicular cancer.  Talk to your health care provider about any symptoms or concerns you have about testicular or prostate cancer. Skin Cancer  Check your skin from head to toe regularly.  Tell your health care provider about any new moles or changes in moles, especially if:  There is a change in a mole's size, shape, or color.  You have a mole that is larger than a pencil eraser.  Always use sunscreen. Apply sunscreen liberally and repeat throughout the day.  Protect yourself by wearing long sleeves, pants, a wide-brimmed hat, and sunglasses when outside. What should I know about heart disease, diabetes, and high blood pressure?  If you are 39-36 years of age, have your blood pressure checked every 3-5 years. If you are 71 years of age or older, have your blood pressure checked every year. You should have your blood pressure measured twice-once when you are at a hospital or clinic, and once when you are not at a  hospital or clinic. Record the average of the two measurements. To check your blood pressure when you are not at a hospital or clinic, you can use:  An automated blood pressure machine at a pharmacy.  A home blood pressure monitor.  Talk to your health care provider about your target blood pressure.  If you are between 48-27 years old, ask your health care provider if you should take aspirin to prevent heart disease.  Have regular diabetes screenings by checking your fasting blood sugar level.  If you are at a normal weight and have a low risk for diabetes, have this test once every three years after the age of 54.  If you are overweight and have a high risk for diabetes, consider being tested at a younger age or more often.  A one-time screening for abdominal aortic aneurysm (AAA) by ultrasound is recommended for men aged 43-75 years who are current or former smokers. What should I know about preventing infection? Hepatitis B  If you have a higher risk for hepatitis B, you should be screened for this virus. Talk with your health care provider to find out if you are at risk for hepatitis B infection. Hepatitis C  Blood testing is recommended for:  Everyone born from [birth year] through 1967.  Anyone with known risk factors for hepatitis C. Sexually Transmitted Diseases (STDs)  You should be screened each year for STDs including gonorrhea and chlamydia if:  You are sexually active and are younger than 68 years of age.  You are older than 68 years of age and your health care provider tells you that you are at risk for this type of infection.  Your sexual activity has changed since you were last screened and you are at an increased risk for chlamydia or gonorrhea. Ask your health care provider if you are at risk.  Talk with your health care provider about whether you are at high risk of being infected with HIV. Your health care provider may recommend a prescription medicine to help prevent  HIV infection. What else can I do?  Schedule regular health, dental, and eye exams.  Stay current with your vaccines (immunizations).  Do not use any tobacco products, such as cigarettes, chewing tobacco, and e-cigarettes. If you need help quitting, ask your health care provider.  Limit alcohol intake to no more than 2 drinks per day. One drink equals 12 ounces of beer, 5 ounces of wine, or 1 ounces of hard liquor.  Do not use street drugs.  Do not share needles.  Ask your health care provider for help if you need support or information about quitting drugs.  Tell your health care provider if you often feel depressed.  Tell your health care provider if you have ever been abused or do not feel safe at home. This information is not intended to replace advice given to you by your health care provider. Make sure you discuss any questions  you have with your health care provider. Document Released: 11/23/2007 Document Revised: 01/24/2016 Document Reviewed: 02/28/2015 Elsevier Interactive Patient Education  2017 [employer].

## 2016-09-23 NOTE — Assessment & Plan Note (Signed)
rec start 1000 IU daily.  

## 2016-09-23 NOTE — Assessment & Plan Note (Addendum)
Preventative protocols reviewed and updated unless pt declined. Discussed healthy diet and lifestyle.  

## 2016-09-23 NOTE — Assessment & Plan Note (Signed)
Discussed healthy diet and lifestyle changes to affect sustainable weight loss  

## 2016-09-23 NOTE — Assessment & Plan Note (Signed)
Increase enalapril to 20mg  bid. Reassess at f/u visit.

## 2016-09-23 NOTE — Progress Notes (Signed)
Pre visit review using our clinic review tool, if applicable. No additional management support is needed unless otherwise documented below in the visit note. 

## 2016-09-23 NOTE — Assessment & Plan Note (Signed)
Chronic, deteriorated - anticipate worsening readings related to weight gain noted. Discussed importance of better glycemic control, weight loss, rec schedule eye exam.  Reviewed low carb diet. Will slowly titrate metformin to 1000mg  bid as per instructions.  Foot exam will be completed at f/u visit.

## 2016-09-23 NOTE — Progress Notes (Addendum)
BP (!) 160/90 (BP Location: Right Arm, Cuff Size: Large)   Pulse 84   Temp 98.1 F (36.7 C) (Oral)   Ht 5\' 7"  (1.702 m)   Wt 251 lb 4 oz (114 kg)   BMI 39.35 kg/m    CC: CPE Subjective:    Patient ID: James Robbins, male    DOB: 07-21-1950, 66 y.o.   MRN: [id number]  HPI: James Robbins is a 67 y.o. male presenting on 09/23/2016 for Annual Exam   Only has medicare part A. Ongoing weight gain noted.   Preventative: Colon cancer screening - colonoscopy 2006 normal. Denies BM changes. No blood in stool. Last colonoscopy woke up early. Leery of colonoscopies. Agrees to iFOB Prostate cancer screening - Discussed. Would like to continue.  Flu - through city yearly prevnar - 02/2014, pneumovax 07/2015 zostavax 2012  Advanced directives - does not have set up. Would want wife to be HCPOA.  Packet previously provided. Seat belt use discussed Sunscreen use discussed - no changing moles Ex smoker quit 1987 Alcohol - none  Lives with wife and children, no pets  Occupation: retired Chemical engineer  Edu: HS  Activity: no regular exercise, wife to join Y  Diet: good water, fruits/vegetables daily, stopped sodas   Relevant past medical, surgical, family and social history reviewed and updated as indicated. Interim medical history since our last visit reviewed. Allergies and medications reviewed and updated. Outpatient Medications Prior to Visit  Medication Sig Dispense Refill  . aspirin 81 MG tablet Take 81 mg by mouth daily.    . cetirizine (ZYRTEC) 10 MG tablet Take 10 mg by mouth daily.    . simvastatin (ZOCOR) 40 MG tablet TAKE ONE TABLET BY MOUTH AT BEDTIME 90 tablet 2  . enalapril (VASOTEC) 20 MG tablet Take one tablet daily 90 tablet 0  . metFORMIN (GLUCOPHAGE) 500 MG tablet TAKE 1 TABLET BY MOUTH TWICE A DAY WITH MEALS. 180 tablet 0   No facility-administered medications prior to visit.      Per HPI unless specifically indicated in ROS section below Review of Systems    Constitutional: Negative for activity change, appetite change, chills, fatigue, fever and unexpected weight change.  HENT: Negative for hearing loss.   Eyes: Negative for visual disturbance.  Respiratory: Negative for cough, chest tightness, shortness of breath and wheezing.   Cardiovascular: Negative for chest pain, palpitations and leg swelling.  Gastrointestinal: Negative for abdominal distention, abdominal pain, blood in stool, constipation, diarrhea, nausea and vomiting.  Genitourinary: Negative for difficulty urinating and hematuria.  Musculoskeletal: Negative for arthralgias, myalgias and neck pain.  Skin: Negative for rash.  Neurological: Negative for dizziness, seizures, syncope and headaches.  Hematological: Negative for adenopathy. Does not bruise/bleed easily.  Psychiatric/Behavioral: Negative for dysphoric mood. The patient is not nervous/anxious.        Objective:    BP (!) 160/90 (BP Location: Right Arm, Cuff Size: Large)   Pulse 84   Temp 98.1 F (36.7 C) (Oral)   Ht 5\' 7"  (1.702 m)   Wt 251 lb 4 oz (114 kg)   BMI 39.35 kg/m   Wt Readings from Last 3 Encounters:  09/23/16 251 lb 4 oz (114 kg)  01/18/16 246 lb (111.6 kg)  07/20/15 227 lb 4 oz (103.1 kg)    Physical Exam  Constitutional: He is oriented to person, place, and time. He appears well-developed and well-nourished. No distress.  HENT:  Head: Normocephalic and atraumatic.  Right Ear: Hearing, tympanic membrane, external  ear and ear canal normal.  Left Ear: Hearing, tympanic membrane, external ear and ear canal normal.  Nose: Nose normal.  Mouth/Throat: Uvula is midline, oropharynx is clear and moist and mucous membranes are normal. No oropharyngeal exudate, posterior oropharyngeal edema or posterior oropharyngeal erythema.  Eyes: Conjunctivae and EOM are normal. Pupils are equal, round, and reactive to light. No scleral icterus.  Neck: Normal range of motion. Neck supple. No thyromegaly present.   Cardiovascular: Normal rate, regular rhythm, normal heart sounds and intact distal pulses.   No murmur heard. Pulses:      Radial pulses are 2+ on the right side, and 2+ on the left side.  Pulmonary/Chest: Effort normal and breath sounds normal. No respiratory distress. He has no wheezes. He has no rales.  Abdominal: Soft. Bowel sounds are normal. He exhibits no distension and no mass. There is no tenderness. There is no rebound and no guarding.  Genitourinary: Rectum normal and prostate normal. Rectal exam shows no external hemorrhoid, no fissure, no mass, no tenderness and anal tone normal. Prostate is not enlarged (20gm) and not tender.  Musculoskeletal: Normal range of motion. He exhibits no edema.  Lymphadenopathy:    He has no cervical adenopathy.  Neurological: He is alert and oriented to person, place, and time.  CN grossly intact, station and gait intact  Skin: Skin is warm and dry. No rash noted.  Psychiatric: He has a normal mood and affect. His behavior is normal. Judgment and thought content normal.  Nursing note and vitals reviewed.  Results for orders placed or performed in visit on 09/18/16  Lipid panel  Result Value Ref Range   Cholesterol 149 0 - 200 mg/dL   Triglycerides 144.0 0.0 - 149.0 mg/dL   HDL 35.10 (L) >39.00 mg/dL   VLDL 28.8 0.0 - 40.0 mg/dL   LDL Cholesterol 86 0 - 99 mg/dL   Total CHOL/HDL Ratio 4    NonHDL 114.34   Hemoglobin A1c  Result Value Ref Range   Hgb A1c MFr Bld 7.4 (H) 4.6 - 6.5 %  PSA, Medicare  Result Value Ref Range   PSA 0.79 0.10 - 4.00 ng/ml  CBC with Differential/Platelet  Result Value Ref Range   WBC 8.3 4.0 - 10.5 K/uL   RBC 5.13 4.22 - 5.81 Mil/uL   Hemoglobin 15.0 13.0 - 17.0 g/dL   HCT 44.7 39.0 - 52.0 %   MCV 87.2 78.0 - 100.0 fl   MCHC 33.6 30.0 - 36.0 g/dL   RDW 13.9 11.5 - 15.5 %   Platelets 255.0 150.0 - 400.0 K/uL   Neutrophils Relative % 69.4 43.0 - 77.0 %   Lymphocytes Relative 20.4 12.0 - 46.0 %   Monocytes  Relative 7.7 3.0 - 12.0 %   Eosinophils Relative 1.9 0.0 - 5.0 %   Basophils Relative 0.6 0.0 - 3.0 %   Neutro Abs 5.7 1.4 - 7.7 K/uL   Lymphs Abs 1.7 0.7 - 4.0 K/uL   Monocytes Absolute 0.6 0.1 - 1.0 K/uL   Eosinophils Absolute 0.2 0.0 - 0.7 K/uL   Basophils Absolute 0.0 0.0 - 0.1 K/uL  VITAMIN D 25 Hydroxy (Vit-D Deficiency, Fractures)  Result Value Ref Range   VITD 14.84 (L) 30.00 - 100.00 ng/mL  Renal function panel  Result Value Ref Range   Sodium 136 135 - 145 mEq/L   Potassium 4.9 3.5 - 5.1 mEq/L   Chloride 101 96 - 112 mEq/L   CO2 27 19 - 32 mEq/L  Calcium 9.7 8.4 - 10.5 mg/dL   Albumin 4.4 3.5 - 5.2 g/dL   BUN 16 6 - 23 mg/dL   Creatinine, Ser 0.93 0.40 - 1.50 mg/dL   Glucose, Bld 174 (H) 70 - 99 mg/dL   Phosphorus 2.8 2.3 - 4.6 mg/dL   GFR 85.96 >60.00 mL/min   Lab Results  Component Value Date   PSA 0.79 09/18/2016   PSA 0.87 07/13/2015   PSA 0.74 02/16/2014      Assessment & Plan:   Problem List Items Addressed This Visit    CKD stage 1 due to type 2 diabetes mellitus ([place])    Increase enalapril to 20mg  bid. Reassess at f/u visit.       Relevant Medications   metFORMIN (GLUCOPHAGE) 1000 MG tablet   enalapril (VASOTEC) 20 MG tablet   Controlled type 2 diabetes mellitus with diabetic nephropathy (HCC)    Chronic, deteriorated - anticipate worsening readings related to weight gain noted. Discussed importance of better glycemic control, weight loss, rec schedule eye exam.  Reviewed low carb diet. Will slowly titrate metformin to 1000mg  bid as per instructions.  Foot exam will be completed at f/u visit.       Relevant Medications   metFORMIN (GLUCOPHAGE) 1000 MG tablet   enalapril (VASOTEC) 20 MG tablet   Health maintenance examination - Primary    Preventative protocols reviewed and updated unless pt declined. Discussed healthy diet and lifestyle.       HLD (hyperlipidemia)    Chronic, stable with simvastatin on board - continue.       Relevant  Medications   enalapril (VASOTEC) 20 MG tablet   HTN (hypertension)    Chronic, uncontrolled. Will increase enalapril to 20mg  BID.       Relevant Medications   enalapril (VASOTEC) 20 MG tablet   Severe obesity (BMI 35.0-39.9) with comorbidity ([place])    Discussed healthy diet and lifestyle changes to affect sustainable weight loss.      Relevant Medications   metFORMIN (GLUCOPHAGE) 1000 MG tablet   Vitamin D deficiency    rec start 1000 IU daily.        Other Visit Diagnoses    Special screening for malignant neoplasms, colon       Relevant Orders   Fecal occult blood, imunochemical       Follow up plan: Return in about 4 months (around 01/23/2017) for follow up visit.  Ria Bush, MD

## 2016-09-23 NOTE — Addendum Note (Signed)
Addended by: Ria Bush on: 09/23/2016 12:03 PM   Modules accepted: Orders

## 2016-11-08 ENCOUNTER — Other Ambulatory Visit: Payer: Self-pay | Admitting: Family Medicine

## 2017-02-12 ENCOUNTER — Other Ambulatory Visit: Payer: Self-pay | Admitting: Family Medicine

## 2017-06-11 ENCOUNTER — Other Ambulatory Visit: Payer: Self-pay | Admitting: Family Medicine

## 2018-03-23 ENCOUNTER — Other Ambulatory Visit: Payer: Self-pay

## 2018-03-23 MED ORDER — METFORMIN HCL 1000 MG PO TABS: 1000.0000 mg | ORAL_TABLET | Freq: Two times a day (BID) | ORAL | 0 refills | Status: DC

## 2018-03-23 NOTE — Telephone Encounter (Signed)
Pt request refill metformin 1000 mg # 180 x 0 to [facility] [address]. Pt has cpx scheduled on 04/15/18. Pt thinks ins requires a 49 day rx. Refilled # 180 x 0 to [facility] [address] pt voiced understanding and will keep appt.

## 2018-03-27 NOTE — Telephone Encounter (Addendum)
Pt came in office today requesting refill enalapril to [facility] [address]. On med list has take enalapril 20 mg one tab bid and take one tab daily. I asked pt how he was taking med. Pt said he has not taken enalapril in over 6 months. Pt has appt for CPX 04/15/18 and wanted to get back on meds before labs and CPX done. Pt had BP taken at allergist office (pt not sure of date) and BP 145/80. Do you want pt to restart enalapril before CPX and if so how do you want pt to take med.Please advise.

## 2018-03-27 NOTE — Addendum Note (Signed)
Addended by: Helene Shoe on: 03/27/2018 11:52 AM   Modules accepted: Orders

## 2018-03-28 MED ORDER — ENALAPRIL MALEATE 20 MG PO TABS: 20.0000 mg | ORAL_TABLET | Freq: Every day | ORAL | 1 refills | Status: DC

## 2018-03-28 NOTE — Telephone Encounter (Signed)
Will refill to take one daily until seen in office

## 2018-04-12 ENCOUNTER — Other Ambulatory Visit: Payer: Self-pay | Admitting: Family Medicine

## 2018-04-12 DIAGNOSIS — E1121 Type 2 diabetes mellitus with diabetic nephropathy: Secondary | ICD-10-CM

## 2018-04-12 DIAGNOSIS — E785 Hyperlipidemia, unspecified: Secondary | ICD-10-CM

## 2018-04-12 DIAGNOSIS — E559 Vitamin D deficiency, unspecified: Secondary | ICD-10-CM

## 2018-04-12 DIAGNOSIS — Z125 Encounter for screening for malignant neoplasm of prostate: Secondary | ICD-10-CM

## 2018-04-13 ENCOUNTER — Other Ambulatory Visit (INDEPENDENT_AMBULATORY_CARE_PROVIDER_SITE_OTHER): Payer: PRIVATE HEALTH INSURANCE

## 2018-04-13 DIAGNOSIS — E1121 Type 2 diabetes mellitus with diabetic nephropathy: Secondary | ICD-10-CM

## 2018-04-13 DIAGNOSIS — E785 Hyperlipidemia, unspecified: Secondary | ICD-10-CM

## 2018-04-13 DIAGNOSIS — E559 Vitamin D deficiency, unspecified: Secondary | ICD-10-CM | POA: Diagnosis not present

## 2018-04-13 DIAGNOSIS — Z125 Encounter for screening for malignant neoplasm of prostate: Secondary | ICD-10-CM

## 2018-04-13 LAB — COMPREHENSIVE METABOLIC PANEL
ALT: 17 U/L (ref 0–53)
AST: 16 U/L (ref 0–37)
Albumin: 4.1 g/dL (ref 3.5–5.2)
Alkaline Phosphatase: 54 U/L (ref 39–117)
BUN: 19 mg/dL (ref 6–23)
CO2: 31 mEq/L (ref 19–32)
Calcium: 9.8 mg/dL (ref 8.4–10.5)
Chloride: 103 mEq/L (ref 96–112)
Creatinine, Ser: 0.95 mg/dL (ref 0.40–1.50)
GFR: 83.48 mL/min (ref >60.00)
Glucose, Bld: 122 mg/dL — ABNORMAL HIGH (ref 70–99)
Potassium: 4.4 mEq/L (ref 3.5–5.1)
Sodium: 140 mEq/L (ref 135–145)
Total Bilirubin: 0.5 mg/dL (ref 0.2–1.2)
Total Protein: 6.2 g/dL (ref 6.0–8.3)

## 2018-04-13 LAB — LIPID PANEL
Cholesterol: 142 mg/dL (ref 0–200)
HDL: 36.9 mg/dL — ABNORMAL LOW (ref >39.00)
LDL Cholesterol: 75 mg/dL (ref 0–99)
NonHDL: 105.04
Total CHOL/HDL Ratio: 4
Triglycerides: 149 mg/dL (ref 0.0–149.0)
VLDL: 29.8 mg/dL (ref 0.0–40.0)

## 2018-04-13 LAB — PSA, MEDICARE: PSA: 1.06 ng/ml (ref 0.10–4.00)

## 2018-04-13 LAB — HEMOGLOBIN A1C: Hgb A1c MFr Bld: 6.4 % (ref 4.6–6.5)

## 2018-04-13 LAB — VITAMIN D 25 HYDROXY (VIT D DEFICIENCY, FRACTURES): VITD: 24.93 ng/mL — ABNORMAL LOW (ref 30.00–100.00)

## 2018-04-15 ENCOUNTER — Encounter: Payer: Self-pay | Admitting: Family Medicine

## 2018-04-15 ENCOUNTER — Ambulatory Visit (INDEPENDENT_AMBULATORY_CARE_PROVIDER_SITE_OTHER): Payer: PRIVATE HEALTH INSURANCE | Admitting: Family Medicine

## 2018-04-15 VITALS — BP 140/78 | HR 81 | Temp 98.2°F | Ht 67.0 in | Wt 233.2 lb

## 2018-04-15 DIAGNOSIS — I10 Essential (primary) hypertension: Secondary | ICD-10-CM | POA: Diagnosis not present

## 2018-04-15 DIAGNOSIS — E1121 Type 2 diabetes mellitus with diabetic nephropathy: Secondary | ICD-10-CM

## 2018-04-15 DIAGNOSIS — E559 Vitamin D deficiency, unspecified: Secondary | ICD-10-CM

## 2018-04-15 DIAGNOSIS — Z0000 Encounter for general adult medical examination without abnormal findings: Secondary | ICD-10-CM

## 2018-04-15 DIAGNOSIS — E1122 Type 2 diabetes mellitus with diabetic chronic kidney disease: Secondary | ICD-10-CM

## 2018-04-15 DIAGNOSIS — N181 Chronic kidney disease, stage 1: Secondary | ICD-10-CM

## 2018-04-15 DIAGNOSIS — E785 Hyperlipidemia, unspecified: Secondary | ICD-10-CM

## 2018-04-15 MED ORDER — ENALAPRIL MALEATE 10 MG PO TABS: 10.0000 mg | ORAL_TABLET | Freq: Every day | ORAL | 3 refills | Status: DC

## 2018-04-15 MED ORDER — SIMVASTATIN 40 MG PO TABS: 40.0000 mg | ORAL_TABLET | Freq: Every day | ORAL | 3 refills | Status: DC

## 2018-04-15 MED ORDER — METFORMIN HCL 1000 MG PO TABS: 1000.0000 mg | ORAL_TABLET | Freq: Two times a day (BID) | ORAL | 3 refills | Status: DC

## 2018-04-15 MED ORDER — ENALAPRIL MALEATE 20 MG PO TABS: 20.0000 mg | ORAL_TABLET | Freq: Every day | ORAL | 3 refills | Status: DC

## 2018-04-15 NOTE — Assessment & Plan Note (Signed)
Chronic, stable. Continue current regimen.  The 10-year ASCVD risk score Mikey Bussing DC Brooke Bonito., et al., 2013) is: 37.6%   Values used to calculate the score:     Age: 67 years     Sex: Male     Is Non-Hispanic African American: No     Diabetic: Yes     Tobacco smoker: No     Systolic Blood Pressure: 676 mmHg     Is BP treated: Yes     HDL Cholesterol: 36.9 mg/dL     Total Cholesterol: 142 mg/dL

## 2018-04-15 NOTE — Assessment & Plan Note (Addendum)
Chronic, stable. Has not been taking enalapril. Will lower dose to 10mg  daily - with weight loss anticipate this will suffice.

## 2018-04-15 NOTE — Patient Instructions (Addendum)
We will sign you up for cologuard - check with insurance if it's fully covered.  If interested, check with pharmacy about new 2 shot shingles series (shingrix).  Restart enalapril 10mg  daily - sent to pharmacy You are doing well. Continue working on sustainable weight loss through healthy diet and activity.  Return as needed or in 1 year for next physical.  Health Maintenance, Male A healthy lifestyle and preventive care is important for your health and wellness. Ask your health care provider about what schedule of regular examinations is right for you. What should I know about weight and diet? Eat a Healthy Diet  Eat plenty of vegetables, fruits, whole grains, low-fat dairy products, and lean protein.  Do not eat a lot of foods high in solid fats, added sugars, or salt.  Maintain a Healthy Weight Regular exercise can help you achieve or maintain a healthy weight. You should:  Do at least 150 minutes of exercise each week. The exercise should increase your heart rate and make you sweat (moderate-intensity exercise).  Do strength-training exercises at least twice a week.  Watch Your Levels of Cholesterol and Blood Lipids  Have your blood tested for lipids and cholesterol every 5 years starting at 69 years of age. If you are at high risk for heart disease, you should start having your blood tested when you are 69 years old. You may need to have your cholesterol levels checked more often if: ? Your lipid or cholesterol levels are high. ? You are older than 69 years of age. ? You are at high risk for heart disease.  What should I know about cancer screening? Many types of cancers can be detected early and may often be prevented. Lung Cancer  You should be screened every year for lung cancer if: ? You are a current smoker who has smoked for at least 30 years. ? You are a former smoker who has quit within the past 15 years.  Talk to your health care provider about your screening  options, when you should start screening, and how often you should be screened.  Colorectal Cancer  Routine colorectal cancer screening usually begins at 69 years of age and should be repeated every 5-10 years until you are 69 years old. You may need to be screened more often if early forms of precancerous polyps or small growths are found. Your health care provider may recommend screening at an earlier age if you have risk factors for colon cancer.  Your health care provider may recommend using home test kits to check for hidden blood in the stool.  A small camera at the end of a tube can be used to examine your colon (sigmoidoscopy or colonoscopy). This checks for the earliest forms of colorectal cancer.  Prostate and Testicular Cancer  Depending on your age and overall health, your health care provider may do certain tests to screen for prostate and testicular cancer.  Talk to your health care provider about any symptoms or concerns you have about testicular or prostate cancer.  Skin Cancer  Check your skin from head to toe regularly.  Tell your health care provider about any new moles or changes in moles, especially if: ? There is a change in a mole's size, shape, or color. ? You have a mole that is larger than a pencil eraser.  Always use sunscreen. Apply sunscreen liberally and repeat throughout the day.  Protect yourself by wearing long sleeves, pants, a wide-brimmed hat, and sunglasses when outside.  What should I know about heart disease, diabetes, and high blood pressure?  If you are 42-54 years of age, have your blood pressure checked every 3-5 years. If you are 42 years of age or older, have your blood pressure checked every year. You should have your blood pressure measured twice-once when you are at a hospital or clinic, and once when you are not at a hospital or clinic. Record the average of the two measurements. To check your blood pressure when you are not at a hospital  or clinic, you can use: ? An automated blood pressure machine at a pharmacy. ? A home blood pressure monitor.  Talk to your health care provider about your target blood pressure.  If you are between 3-36 years old, ask your health care provider if you should take aspirin to prevent heart disease.  Have regular diabetes screenings by checking your fasting blood sugar level. ? If you are at a normal weight and have a low risk for diabetes, have this test once every three years after the age of 25. ? If you are overweight and have a high risk for diabetes, consider being tested at a younger age or more often.  A one-time screening for abdominal aortic aneurysm (AAA) by ultrasound is recommended for men aged 79-75 years who are current or former smokers. What should I know about preventing infection? Hepatitis B If you have a higher risk for hepatitis B, you should be screened for this virus. Talk with your health care provider to find out if you are at risk for hepatitis B infection. Hepatitis C Blood testing is recommended for:  Everyone born from [birth year] through 1967.  Anyone with known risk factors for hepatitis C.  Sexually Transmitted Diseases (STDs)  You should be screened each year for STDs including gonorrhea and chlamydia if: ? You are sexually active and are younger than 69 years of age. ? You are older than 69 years of age and your health care provider tells you that you are at risk for this type of infection. ? Your sexual activity has changed since you were last screened and you are at an increased risk for chlamydia or gonorrhea. Ask your health care provider if you are at risk.  Talk with your health care provider about whether you are at high risk of being infected with HIV. Your health care provider may recommend a prescription medicine to help prevent HIV infection.  What else can I do?  Schedule regular health, dental, and eye exams.  Stay current with your vaccines  (immunizations).  Do not use any tobacco products, such as cigarettes, chewing tobacco, and e-cigarettes. If you need help quitting, ask your health care provider.  Limit alcohol intake to no more than 2 drinks per day. One drink equals 12 ounces of beer, 5 ounces of wine, or 1 ounces of hard liquor.  Do not use street drugs.  Do not share needles.  Ask your health care provider for help if you need support or information about quitting drugs.  Tell your health care provider if you often feel depressed.  Tell your health care provider if you have ever been abused or do not feel safe at home. This information is not intended to replace advice given to you by your health care provider. Make sure you discuss any questions you have with your health care provider. Document Released: 11/23/2007 Document Revised: 01/24/2016 Document Reviewed: 02/28/2015 Elsevier Interactive Patient Education  [employer].

## 2018-04-15 NOTE — Assessment & Plan Note (Signed)
Preventative protocols reviewed and updated unless pt declined. Discussed healthy diet and lifestyle.  

## 2018-04-15 NOTE — Assessment & Plan Note (Signed)
Chronic, stable. Has restarted metformin 1000mg  BID.

## 2018-04-15 NOTE — Progress Notes (Signed)
BP 140/78 (BP Location: Left Arm, Patient Position: Sitting, Cuff Size: Normal)   Pulse 81   Temp 98.2 F (36.8 C) (Oral)   Ht _0  (1.702 m)   Wt 233 lb 4 oz (105.8 kg)   SpO2 95%   BMI 36.53 kg/m    CC: CPE Subjective:    Patient ID: James Robbins, male    DOB: [birth year], 67 y.o.   MRN: [id number]  HPI: James Robbins is a 67 y.o. male presenting on 04/15/2018 for Annual Exam   Only has medicare part A. Signed up with [facility] weight loss clinic, lost 30 lbs. Has meal plan through [facility]. He stopped meds for a time, weight gain noted. Has refilled all medications except enalapril (just restarted a few weeks ago). Nadir weight 200 lbs. Never used medications for this.   Taking aleve for OA.   Preventative: Colon cancer screening - colonoscopy 2006 normal. Denies BM changes. No blood in stool. Last colonoscopy woke up early. Leery of colonoscopies. Did not turn in stool kit last year. Agrees to cologuard this year  Prostate cancer screening - Discussed. Would like to continue.  [facility] - [facility]nar - 02/2014, pneumovax 07/2015 zostavax 2012  shingrix - discussed Advanced directives - does not have set up. Would want wife to be HCPOA. Packet previously provided. Seat belt use discussed Sunscreen use discussed - no changing moles Ex smoker quit 1987 Alcohol - none Dentist yearly Eye exam overdue  Lives with wife and children, no pets  Occupation: retired Chemical engineer  Edu: HS  Activity: no regular exercise, joined Y  Diet: good water, fruits/vegetables daily, stopped sodas   Relevant past medical, surgical, family and social history reviewed and updated as indicated. Interim medical history since our last visit reviewed. Allergies and medications reviewed and updated. Outpatient Medications Prior to Visit  Medication Sig Dispense Refill  . aspirin 81 MG tablet Take 81 mg by mouth daily.    . cetirizine (ZYRTEC) 10 MG tablet Take 10 mg by mouth daily.      . Cholecalciferol (VITAMIN D3) 1000 units CAPS Take 1 capsule (1,000 Units total) by mouth daily. 30 capsule   . Naproxen Sodium (ALEVE PO) Take 1 tablet by mouth daily. Aleve Arthritis    . enalapril (VASOTEC) 20 MG tablet Take 1 tablet (20 mg total) by mouth daily. Take one tablet daily 90 tablet 1  . metFORMIN (GLUCOPHAGE) 1000 MG tablet Take 1 tablet (1,000 mg total) by mouth 2 (two) times daily with a meal. 180 tablet 0  . simvastatin (ZOCOR) 40 MG tablet TAKE ONE TABLET BY MOUTH EVERY NIGHT AT BEDTIME 90 tablet 2   No facility-administered medications prior to visit.      Per HPI unless specifically indicated in ROS section below Review of Systems  Constitutional: Negative for activity change, appetite change, chills, fatigue, fever and unexpected weight change.  HENT: Negative for hearing loss.   Eyes: Negative for visual disturbance.  Respiratory: Negative for cough, chest tightness, shortness of breath and wheezing.   Cardiovascular: Negative for chest pain, palpitations and leg swelling.  Gastrointestinal: Negative for abdominal distention, abdominal pain, blood in stool, constipation, diarrhea, nausea and vomiting.  Genitourinary: Negative for difficulty urinating and hematuria.  Musculoskeletal: Negative for arthralgias, myalgias and neck pain.  Skin: Negative for rash.  Neurological: Negative for dizziness, seizures, syncope and headaches.  Hematological: Negative for adenopathy. Does not bruise/bleed easily.  Psychiatric/Behavioral: Negative for dysphoric mood. The  patient is not nervous/anxious.        Objective:    BP 140/78 (BP Location: Left Arm, Patient Position: Sitting, Cuff Size: Normal)   Pulse 81   Temp 98.2 F (36.8 C) (Oral)   Ht _0  (1.702 m)   Wt 233 lb 4 oz (105.8 kg)   SpO2 95%   BMI 36.53 kg/m   Wt Readings from Last 3 Encounters:  04/15/18 233 lb 4 oz (105.8 kg)  09/23/16 251 lb 4 oz (114 kg)  01/18/16 246 lb (111.6 kg)    Physical Exam   Constitutional: He is oriented to person, place, and time. He appears well-developed and well-nourished. No distress.  HENT:  Head: Normocephalic and atraumatic.  Right Ear: Hearing, tympanic membrane, external ear and ear canal normal.  Left Ear: Hearing, tympanic membrane, external ear and ear canal normal.  Nose: Nose normal.  Mouth/Throat: Uvula is midline, oropharynx is clear and moist and mucous membranes are normal. No oropharyngeal exudate, posterior oropharyngeal edema or posterior oropharyngeal erythema.  Eyes: Pupils are equal, round, and reactive to light. Conjunctivae and EOM are normal. No scleral icterus.  Neck: Normal range of motion. Neck supple. Carotid bruit is not present. No thyromegaly present.  Cardiovascular: Normal rate, regular rhythm, normal heart sounds and intact distal pulses.  No murmur heard. Pulses:      Radial pulses are 2+ on the right side, and 2+ on the left side.  Pulmonary/Chest: Effort normal and breath sounds normal. No respiratory distress. He has no wheezes. He has no rales.  Abdominal: Soft. Bowel sounds are normal. He exhibits no distension and no mass. There is no tenderness. There is no rebound and no guarding.  Musculoskeletal: Normal range of motion. He exhibits no edema.  Lymphadenopathy:    He has no cervical adenopathy.  Neurological: He is alert and oriented to person, place, and time.  CN grossly intact, station and gait intact  Skin: Skin is warm and dry. No rash noted.  Psychiatric: He has a normal mood and affect. His behavior is normal. Judgment and thought content normal.  Nursing note and vitals reviewed.  Results for orders placed or performed in visit on 04/13/18  PSA, Medicare  Result Value Ref Range   PSA 1.06 0.10 - 4.00 ng/ml  Comprehensive metabolic panel  Result Value Ref Range   Sodium 140 135 - 145 mEq/L   Potassium 4.4 3.5 - 5.1 mEq/L   Chloride 103 96 - 112 mEq/L   CO2 31 19 - 32 mEq/L   Glucose, Bld 122 (H)  70 - 99 mg/dL   BUN 19 6 - 23 mg/dL   Creatinine, Ser 0.95 0.40 - 1.50 mg/dL   Total Bilirubin 0.5 0.2 - 1.2 mg/dL   Alkaline Phosphatase 54 39 - 117 U/L   AST 16 0 - 37 U/L   ALT 17 0 - 53 U/L   Total Protein 6.2 6.0 - 8.3 g/dL   Albumin 4.1 3.5 - 5.2 g/dL   Calcium 9.8 8.4 - 10.5 mg/dL   GFR 83.48 >60.00 mL/min  Hemoglobin A1c  Result Value Ref Range   Hgb A1c MFr Bld 6.4 4.6 - 6.5 %  Lipid panel  Result Value Ref Range   Cholesterol 142 0 - 200 mg/dL   Triglycerides 149.0 0.0 - 149.0 mg/dL   HDL 36.90 (L) >39.00 mg/dL   VLDL 29.8 0.0 - 40.0 mg/dL   LDL Cholesterol 75 0 - 99 mg/dL   Total CHOL/HDL Ratio 4  NonHDL 105.04   VITAMIN D 25 Hydroxy (Vit-D Deficiency, Fractures)  Result Value Ref Range   VITD 24.93 (L) 30.00 - 100.00 ng/mL      Assessment & Plan:   Problem List Items Addressed This Visit    Vitamin D deficiency    Continue vit D 1000 IU replacement.       Severe obesity (BMI 35.0-39.9) with comorbidity ([place])    Encouraged ongoing efforts at weight loss. Pt states goal weight is 200 lbs. Motivated to restart meal plan recommended by [facility] clinic.      Relevant Medications   metFORMIN (GLUCOPHAGE) 1000 MG tablet   HTN (hypertension)    Chronic, stable. Has not been taking enalapril. Will lower dose to 14m daily - with weight loss anticipate this will suffice.       Relevant Medications   simvastatin (ZOCOR) 40 MG tablet   enalapril (VASOTEC) 10 MG tablet   HLD (hyperlipidemia)    Chronic, stable. Continue current regimen.  The 10-year ASCVD risk score (Mikey BussingDC JBrooke Bonito, et al., 2013) is: 37.6%   Values used to calculate the score:     Age: [age]years     Sex: Male     Is Non-Hispanic African American: No     Diabetic: Yes     Tobacco smoker: No     Systolic Blood Pressure: 1657mmHg     Is BP treated: Yes     HDL Cholesterol: 36.9 mg/dL     Total Cholesterol: 142 mg/dL       Relevant Medications   simvastatin (ZOCOR) 40 MG tablet   enalapril  (VASOTEC) 10 MG tablet   Health maintenance examination - Primary    Preventative protocols reviewed and updated unless pt declined. Discussed healthy diet and lifestyle.       Controlled type 2 diabetes mellitus with diabetic nephropathy (HCC)    Chronic, stable. Has restarted metformin 10069mBID.       Relevant Medications   metFORMIN (GLUCOPHAGE) 1000 MG tablet   simvastatin (ZOCOR) 40 MG tablet   enalapril (VASOTEC) 10 MG tablet   CKD stage 1 due to type 2 diabetes mellitus (HCC)   Relevant Medications   metFORMIN (GLUCOPHAGE) 1000 MG tablet   simvastatin (ZOCOR) 40 MG tablet   enalapril (VASOTEC) 10 MG tablet       Meds ordered this encounter  Medications  . DISCONTD: enalapril (VASOTEC) 20 MG tablet    Sig: Take 1 tablet (20 mg total) by mouth daily.    Dispense:  90 tablet    Refill:  3  . metFORMIN (GLUCOPHAGE) 1000 MG tablet    Sig: Take 1 tablet (1,000 mg total) by mouth 2 (two) times daily with a meal.    Dispense:  180 tablet    Refill:  3  . simvastatin (ZOCOR) 40 MG tablet    Sig: Take 1 tablet (40 mg total) by mouth at bedtime.    Dispense:  90 tablet    Refill:  3  . enalapril (VASOTEC) 10 MG tablet    Sig: Take 1 tablet (10 mg total) by mouth daily.    Dispense:  90 tablet    Refill:  3    Use this dose, not 2075m No orders of the defined types were placed in this encounter.   Follow up plan: Return in about 1 year (around 04/16/2019) for annual exam, prior fasting for blood work.  JavRia BushD

## 2018-04-15 NOTE — Assessment & Plan Note (Signed)
Continue vit D 1000 IU replacement.

## 2018-04-15 NOTE — Assessment & Plan Note (Signed)
Encouraged ongoing efforts at weight loss. Pt states goal weight is 200 lbs. Motivated to restart meal plan recommended by [facility] clinic.

## 2018-04-28 LAB — COLOGUARD: Cologuard: NEGATIVE

## 2018-05-18 ENCOUNTER — Telehealth: Payer: Self-pay

## 2018-05-18 ENCOUNTER — Encounter: Payer: Self-pay | Admitting: Family Medicine

## 2018-05-18 NOTE — Telephone Encounter (Signed)
Spoke with pt relaying results.  Pt verbalizes understanding.  

## 2018-05-18 NOTE — Telephone Encounter (Signed)
Left message on vm for pt to call back.   Need to relay, per Dr. Darnell Level, Cologuard results returned negative.

## 2018-08-10 ENCOUNTER — Ambulatory Visit
Admission: EM | Admit: 2018-08-10 | Discharge: 2018-08-10 | Disposition: A | Discharge status: Home / Self Care | Payer: PRIVATE HEALTH INSURANCE | Attending: Emergency Medicine | Admitting: Emergency Medicine

## 2018-08-10 DIAGNOSIS — H66002 Acute suppurative otitis media without spontaneous rupture of ear drum, left ear: Secondary | ICD-10-CM | POA: Diagnosis not present

## 2018-08-10 MED ORDER — AMOXICILLIN-POT CLAVULANATE 875-125 MG PO TABS: 1.0000 | ORAL_TABLET | Freq: Two times a day (BID) | ORAL | 0 refills | Status: AC

## 2018-08-10 NOTE — ED Provider Notes (Signed)
HPI  SUBJECTIVE:  James Robbins is a 68 y.o. male who presents with 3 days of achy left ear pain.  States that his ear feels "stopped up".  States that he is hearing "static".  He states that it sometimes pops when he opens his mouth.  He reports questionable clear otorrhea.  No fevers, nasal congestion, rhinorrhea.  States his allergies are not bothering him at this point in time.  He does use Q-tips on a daily basis.  No recent swimming, grinding teeth.  Takes Zyrtec and Aleve on a regular basis without any improvement in symptoms.  Has not tried anything specifically for this.  Symptoms are not aggravated with anything - more specifically chewing, yawning, tilting his head.  He has a past medical history of diabetes, chronic kidney disease stage I, hypertension, allergies for which he takes allergy shots and Zyrtec on an ongoing basis.  No for history of frequent otitis media, TMJ arthralgia.  PMD: Ria Bush, MD   Past Medical History:  Diagnosis Date  . Chronic kidney disease (CKD), stage I    proteinuria  . Controlled type 2 diabetes mellitus with diabetic nephropathy ([place])    Completed DSME 2017  . Hematuria 2014   s/p normal urological workup ([place])  . Hyperlipidemia   . Hypertension   . Obesity   . Seasonal allergic rhinitis     Past Surgical History:  Procedure Laterality Date  . COLONOSCOPY  2006   per pt normal ([place])  . VASECTOMY    . VASECTOMY REVERSAL      Family History  Problem Relation Age of Onset  . Diabetes Mother   . Hypertension Mother   . Hyperlipidemia Mother   . CAD Father        4v CABG, smoker  . Osteoporosis Sister   . Cancer Neg Hx     Social History   Tobacco Use  . Smoking status: Former Smoker    Last attempt to quit: 06/09/1986    Years since quitting: 32.1  . Smokeless tobacco: Never Used  Substance Use Topics  . Alcohol use: No    Alcohol/week: 0.0 standard drinks  . Drug use: No    No current facility-administered  medications for this encounter.   Current Outpatient Medications:  .  amoxicillin-clavulanate (AUGMENTIN) 875-125 MG tablet, Take 1 tablet by mouth 2 (two) times daily for 7 days., Disp: 14 tablet, Rfl: 0 .  aspirin 81 MG tablet, Take 81 mg by mouth daily., Disp: , Rfl:  .  cetirizine (ZYRTEC) 10 MG tablet, Take 10 mg by mouth daily., Disp: , Rfl:  .  Cholecalciferol (VITAMIN D3) 1000 units CAPS, Take 1 capsule (1,000 Units total) by mouth daily., Disp: 30 capsule, Rfl:  .  enalapril (VASOTEC) 10 MG tablet, Take 1 tablet (10 mg total) by mouth daily., Disp: 90 tablet, Rfl: 3 .  metFORMIN (GLUCOPHAGE) 1000 MG tablet, Take 1 tablet (1,000 mg total) by mouth 2 (two) times daily with a meal., Disp: 180 tablet, Rfl: 3 .  Naproxen Sodium (ALEVE PO), Take 1 tablet by mouth daily. Aleve Arthritis, Disp: , Rfl:  .  simvastatin (ZOCOR) 40 MG tablet, Take 1 tablet (40 mg total) by mouth at bedtime., Disp: 90 tablet, Rfl: 3  No Known Allergies   ROS  As noted in HPI.   Physical Exam  BP (!) 176/85 (BP Location: Left Arm)   Pulse 81   Temp 98.6 F (37 C) (Oral)   Resp 18  SpO2 95%   Constitutional: Well developed, well nourished, no acute distress Eyes:  EOMI, conjunctiva normal bilaterally HENT: Normocephalic, atraumatic,mucus membranes moist .  left external ear normal.  No pain with traction on pinna.  No pain with palpation of mastoid.  Slightly erythematous, irritated EAC.  Left TM dull, erythematous, bulging.  Right TM normal. Neck: No cervical lymphadenopathy. Respiratory: Normal inspiratory effort Cardiovascular: Normal rate GI: nondistended skin: No rash, skin intact Musculoskeletal: no deformities Neurologic: Alert & oriented x 3, no focal neuro deficits Psychiatric: Speech and behavior appropriate   ED Course   Medications - No data to display  No orders of the defined types were placed in this encounter.   No results found for this or any previous visit (from the past  24 hour(s)). No results found.  ED Clinical Impression  Non-recurrent acute suppurative otitis media of left ear without spontaneous rupture of tympanic membrane   ED Assessment/Plan  Left-sided otitis media.  Home with Augmentin for 7 days.  He does have some irritation of the external ear canal, but do not think that this needs antibiotics at this point in time.  Advised him to quit using Q-tips on a daily basis.  Follow-up with PMD as needed.  Discussed MDM, treatment plan, and plan for follow-up with patient.patient agrees with plan.   Meds ordered this encounter  Medications  . amoxicillin-clavulanate (AUGMENTIN) 875-125 MG tablet    Sig: Take 1 tablet by mouth 2 (two) times daily for 7 days.    Dispense:  14 tablet    Refill:  0    *This clinic note was created using [employer]. Therefore, there may be occasional mistakes despite careful proofreading.   ?   Melynda Ripple, MD 08/10/18 1101

## 2018-08-10 NOTE — ED Triage Notes (Signed)
Pt c/o lt ear ache x3days

## 2018-08-14 ENCOUNTER — Ambulatory Visit: Payer: PRIVATE HEALTH INSURANCE | Admitting: Family Medicine

## 2018-08-14 ENCOUNTER — Encounter: Payer: Self-pay | Admitting: Family Medicine

## 2018-08-14 VITALS — BP 154/80 | HR 81 | Temp 98.2°F | Ht 67.0 in | Wt 239.0 lb

## 2018-08-14 DIAGNOSIS — I10 Essential (primary) hypertension: Secondary | ICD-10-CM

## 2018-08-14 DIAGNOSIS — H60392 Other infective otitis externa, left ear: Secondary | ICD-10-CM

## 2018-08-14 DIAGNOSIS — H6090 Unspecified otitis externa, unspecified ear: Secondary | ICD-10-CM | POA: Insufficient documentation

## 2018-08-14 NOTE — Assessment & Plan Note (Signed)
BP elevated today - last visit we decreased enalapril to 10mg  once daily. Advised monitor more closely at home and if consistently elevated, to increase enalapril to 20mg  daily - update [place] for new Rx.

## 2018-08-14 NOTE — Progress Notes (Signed)
BP (!) 154/80 (BP Location: Right Arm, Patient Position: Sitting, Cuff Size: Large)   Pulse 81   Temp 98.2 F (36.8 C) (Oral)   Ht 5\' 7"  (1.702 m)   Wt 239 lb (108.4 kg)   SpO2 96%   BMI 37.43 kg/m   BP Readings from Last 3 Encounters:  08/14/18 (!) 154/80  08/10/18 (!) 176/85  04/15/18 140/78    CC: UCC f/u visit Subjective:    Patient ID: James Robbins, male    DOB: 07-16-1950, 68 y.o.   MRN: [id number]  HPI: James Robbins is a 68 y.o. male presenting on 08/14/2018 for Hospitalization Follow-up (Seen at [facility] on 08/10/18.)   Seen at [facility] earlier this week with 3d h/o L achey pain dx with suppurative otitis media treated with augmentin 7d course. Advised against Q tip use. Small amt bloody discharge started a few days ago. Ear pain is improving but congestion persists. Itchy ear canal.  No fevers or chills.   HTN - bp elevated recently. Has not been checking. No HA, vision changes, CP/tightness, SOB, leg swelling.      Relevant past medical, surgical, family and social history reviewed and updated as indicated. Interim medical history since our last visit reviewed. Allergies and medications reviewed and updated. Outpatient Medications Prior to Visit  Medication Sig Dispense Refill  . amoxicillin-clavulanate (AUGMENTIN) 875-125 MG tablet Take 1 tablet by mouth 2 (two) times daily for 7 days. 14 tablet 0  . aspirin 81 MG tablet Take 81 mg by mouth daily.    . cetirizine (ZYRTEC) 10 MG tablet Take 10 mg by mouth daily.    . Cholecalciferol (VITAMIN D3) 1000 units CAPS Take 1 capsule (1,000 Units total) by mouth daily. 30 capsule   . enalapril (VASOTEC) 10 MG tablet Take 1 tablet (10 mg total) by mouth daily. 90 tablet 3  . metFORMIN (GLUCOPHAGE) 1000 MG tablet Take 1 tablet (1,000 mg total) by mouth 2 (two) times daily with a meal. 180 tablet 3  . Naproxen Sodium (ALEVE PO) Take 1 tablet by mouth daily. Aleve Arthritis    . simvastatin (ZOCOR) 40 MG tablet Take 1 tablet (40 mg  total) by mouth at bedtime. 90 tablet 3   No facility-administered medications prior to visit.      Per HPI unless specifically indicated in ROS section below Review of Systems Objective:    BP (!) 154/80 (BP Location: Right Arm, Patient Position: Sitting, Cuff Size: Large)   Pulse 81   Temp 98.2 F (36.8 C) (Oral)   Ht 5\' 7"  (1.702 m)   Wt 239 lb (108.4 kg)   SpO2 96%   BMI 37.43 kg/m   Wt Readings from Last 3 Encounters:  08/14/18 239 lb (108.4 kg)  04/15/18 233 lb 4 oz (105.8 kg)  09/23/16 251 lb 4 oz (114 kg)    Physical Exam Vitals signs and nursing note reviewed.  Constitutional:      Appearance: Normal appearance. He is not ill-appearing.  HENT:     Head: Normocephalic and atraumatic.     Right Ear: Tympanic membrane, ear canal and external ear normal. There is no impacted cerumen.     Left Ear: External ear normal. Decreased hearing noted. Drainage present.     Ears:     Comments: L macerated cerumen with thin grayish discharge, with black spots also present in discharge  Partially cleaned with cotton tip applicator    Nose: Nose normal.     Mouth/Throat:  Mouth: Mucous membranes are moist.  Eyes:     Extraocular Movements: Extraocular movements intact.     Pupils: Pupils are equal, round, and reactive to light.  Lymphadenopathy:     Cervical: No cervical adenopathy.  Neurological:     Mental Status: He is alert.       Assessment & Plan:   Problem List Items Addressed This Visit    Otitis externa - Primary    Initially treated for AOM on left - exam today more consistent with otitis externa, but given appearance of discharge and itching suspicious for fungal otitis externa. Will refer to ENT for full cleaning and further eval. Pt agrees with plan.       Relevant Orders   Ambulatory referral to ENT   HTN (hypertension)    BP elevated today - last visit we decreased enalapril to 10mg  once daily. Advised monitor more closely at home and if consistently  elevated, to increase enalapril to 20mg  daily - update [place] for new Rx.          No orders of the defined types were placed in this encounter.  Orders Placed This Encounter  Procedures  . Ambulatory referral to ENT    Referral Priority:   Routine    Referral Type:   Consultation    Referral Reason:   Specialty Services Required    Requested Specialty:   Otolaryngology    Number of Visits Requested:   1    Patient Instructions  Start watching blood pressures and if consistently >150, double enalapril to 20mg  daily (2 tablets). Let me know if you do this and are running low to fill prescription with new dose.  I think you have external ear infection, suspicious for fungal infection.  I want to have you seen ENT - see Rosaria Ferries to get this scheduled.    Follow up plan: No follow-ups on file.  Ria Bush, MD

## 2018-08-14 NOTE — Assessment & Plan Note (Signed)
Initially treated for AOM on left - exam today more consistent with otitis externa, but given appearance of discharge and itching suspicious for fungal otitis externa. Will refer to ENT for full cleaning and further eval. Pt agrees with plan.

## 2018-08-14 NOTE — Patient Instructions (Addendum)
Start watching blood pressures and if consistently >150, double enalapril to 20mg  daily (2 tablets). Let me know if you do this and are running low to fill prescription with new dose.  I think you have external ear infection, suspicious for fungal infection.  I want to have you seen ENT - see Rosaria Ferries to get this scheduled.

## 2018-11-13 ENCOUNTER — Telehealth: Payer: Self-pay | Admitting: Family Medicine

## 2018-11-13 MED ORDER — ENALAPRIL MALEATE 20 MG PO TABS: 20.0000 mg | ORAL_TABLET | Freq: Every day | ORAL | 1 refills | Status: DC

## 2018-11-13 NOTE — Telephone Encounter (Signed)
Best number [phone] Pt called stating dr g had him double his bp med enalapril.  He is out of his meds and need a new rx  Needs to be 20mg    [facility] [address]

## 2018-11-13 NOTE — Telephone Encounter (Signed)
Sent in

## 2019-04-15 ENCOUNTER — Other Ambulatory Visit: Payer: Self-pay | Admitting: Family Medicine

## 2019-04-15 DIAGNOSIS — Z125 Encounter for screening for malignant neoplasm of prostate: Secondary | ICD-10-CM

## 2019-04-15 DIAGNOSIS — E1122 Type 2 diabetes mellitus with diabetic chronic kidney disease: Secondary | ICD-10-CM

## 2019-04-15 DIAGNOSIS — E785 Hyperlipidemia, unspecified: Secondary | ICD-10-CM

## 2019-04-15 DIAGNOSIS — E1121 Type 2 diabetes mellitus with diabetic nephropathy: Secondary | ICD-10-CM

## 2019-04-15 DIAGNOSIS — E559 Vitamin D deficiency, unspecified: Secondary | ICD-10-CM

## 2019-04-15 DIAGNOSIS — Z1159 Encounter for screening for other viral diseases: Secondary | ICD-10-CM

## 2019-04-15 DIAGNOSIS — N181 Chronic kidney disease, stage 1: Secondary | ICD-10-CM

## 2019-04-16 ENCOUNTER — Other Ambulatory Visit (INDEPENDENT_AMBULATORY_CARE_PROVIDER_SITE_OTHER): Payer: PRIVATE HEALTH INSURANCE

## 2019-04-16 DIAGNOSIS — Z1159 Encounter for screening for other viral diseases: Secondary | ICD-10-CM

## 2019-04-16 DIAGNOSIS — E1121 Type 2 diabetes mellitus with diabetic nephropathy: Secondary | ICD-10-CM | POA: Diagnosis not present

## 2019-04-16 DIAGNOSIS — E1122 Type 2 diabetes mellitus with diabetic chronic kidney disease: Secondary | ICD-10-CM | POA: Diagnosis not present

## 2019-04-16 DIAGNOSIS — N181 Chronic kidney disease, stage 1: Secondary | ICD-10-CM | POA: Diagnosis not present

## 2019-04-16 DIAGNOSIS — E559 Vitamin D deficiency, unspecified: Secondary | ICD-10-CM | POA: Diagnosis not present

## 2019-04-16 DIAGNOSIS — E785 Hyperlipidemia, unspecified: Secondary | ICD-10-CM | POA: Diagnosis not present

## 2019-04-16 DIAGNOSIS — Z125 Encounter for screening for malignant neoplasm of prostate: Secondary | ICD-10-CM

## 2019-04-16 LAB — COMPREHENSIVE METABOLIC PANEL
ALT: 26 U/L (ref 0–53)
AST: 19 U/L (ref 0–37)
Albumin: 4.3 g/dL (ref 3.5–5.2)
Alkaline Phosphatase: 53 U/L (ref 39–117)
BUN: 18 mg/dL (ref 6–23)
CO2: 30 mEq/L (ref 19–32)
Calcium: 9.6 mg/dL (ref 8.4–10.5)
Chloride: 101 mEq/L (ref 96–112)
Creatinine, Ser: 0.96 mg/dL (ref 0.40–1.50)
GFR: 77.38 mL/min (ref >60.00)
Glucose, Bld: 156 mg/dL — ABNORMAL HIGH (ref 70–99)
Potassium: 4.3 mEq/L (ref 3.5–5.1)
Sodium: 139 mEq/L (ref 135–145)
Total Bilirubin: 0.5 mg/dL (ref 0.2–1.2)
Total Protein: 6.3 g/dL (ref 6.0–8.3)

## 2019-04-16 LAB — PSA: PSA: 0.8 ng/mL (ref 0.10–4.00)

## 2019-04-16 LAB — LIPID PANEL
Cholesterol: 139 mg/dL (ref 0–200)
HDL: 40.6 mg/dL (ref >39.00)
LDL Cholesterol: 69 mg/dL (ref 0–99)
NonHDL: 98.46
Total CHOL/HDL Ratio: 3
Triglycerides: 146 mg/dL (ref 0.0–149.0)
VLDL: 29.2 mg/dL (ref 0.0–40.0)

## 2019-04-16 LAB — HEMOGLOBIN A1C: Hgb A1c MFr Bld: 6.8 % — ABNORMAL HIGH (ref 4.6–6.5)

## 2019-04-16 LAB — VITAMIN D 25 HYDROXY (VIT D DEFICIENCY, FRACTURES): VITD: 40.04 ng/mL (ref 30.00–100.00)

## 2019-04-19 LAB — HEPATITIS C ANTIBODY
Hepatitis C Ab: NONREACTIVE
SIGNAL TO CUT-OFF: 0 (ref <1.00)

## 2019-04-23 ENCOUNTER — Ambulatory Visit (INDEPENDENT_AMBULATORY_CARE_PROVIDER_SITE_OTHER): Payer: PRIVATE HEALTH INSURANCE | Admitting: Family Medicine

## 2019-04-23 ENCOUNTER — Encounter: Payer: Self-pay | Admitting: Family Medicine

## 2019-04-23 ENCOUNTER — Other Ambulatory Visit: Payer: Self-pay

## 2019-04-23 VITALS — BP 136/76 | HR 75 | Temp 98.1°F | Ht 65.5 in | Wt 248.2 lb

## 2019-04-23 DIAGNOSIS — Z7189 Other specified counseling: Secondary | ICD-10-CM | POA: Diagnosis not present

## 2019-04-23 DIAGNOSIS — I10 Essential (primary) hypertension: Secondary | ICD-10-CM

## 2019-04-23 DIAGNOSIS — E785 Hyperlipidemia, unspecified: Secondary | ICD-10-CM

## 2019-04-23 DIAGNOSIS — I152 Hypertension secondary to endocrine disorders: Secondary | ICD-10-CM

## 2019-04-23 DIAGNOSIS — E1169 Type 2 diabetes mellitus with other specified complication: Secondary | ICD-10-CM

## 2019-04-23 DIAGNOSIS — M170 Bilateral primary osteoarthritis of knee: Secondary | ICD-10-CM | POA: Insufficient documentation

## 2019-04-23 DIAGNOSIS — E1121 Type 2 diabetes mellitus with diabetic nephropathy: Secondary | ICD-10-CM

## 2019-04-23 DIAGNOSIS — E1122 Type 2 diabetes mellitus with diabetic chronic kidney disease: Secondary | ICD-10-CM | POA: Diagnosis not present

## 2019-04-23 DIAGNOSIS — Z0000 Encounter for general adult medical examination without abnormal findings: Secondary | ICD-10-CM

## 2019-04-23 DIAGNOSIS — E559 Vitamin D deficiency, unspecified: Secondary | ICD-10-CM

## 2019-04-23 DIAGNOSIS — G8929 Other chronic pain: Secondary | ICD-10-CM

## 2019-04-23 DIAGNOSIS — E1159 Type 2 diabetes mellitus with other circulatory complications: Secondary | ICD-10-CM

## 2019-04-23 DIAGNOSIS — M25562 Pain in left knee: Secondary | ICD-10-CM

## 2019-04-23 DIAGNOSIS — N181 Chronic kidney disease, stage 1: Secondary | ICD-10-CM

## 2019-04-23 MED ORDER — ENALAPRIL MALEATE 20 MG PO TABS: 20.0000 mg | ORAL_TABLET | Freq: Every day | ORAL | 3 refills | Status: DC

## 2019-04-23 MED ORDER — SIMVASTATIN 40 MG PO TABS: 40.0000 mg | ORAL_TABLET | Freq: Every day | ORAL | 3 refills | Status: DC

## 2019-04-23 MED ORDER — METFORMIN HCL 1000 MG PO TABS: 1000.0000 mg | ORAL_TABLET | Freq: Two times a day (BID) | ORAL | 3 refills | Status: DC

## 2019-04-23 MED ORDER — OSTEO BI-FLEX REGULAR STRENGTH 250-200 MG PO TABS: 1.0000 | ORAL_TABLET | Freq: Every day | ORAL | 0 refills | Status: DC

## 2019-04-23 NOTE — Assessment & Plan Note (Signed)
Preventative protocols reviewed and updated unless pt declined. Discussed healthy diet and lifestyle.  

## 2019-04-23 NOTE — Assessment & Plan Note (Signed)
Chronic, stable. Continue current regimen. 

## 2019-04-23 NOTE — Assessment & Plan Note (Signed)
Chronic, stable on metformin 1000mg  bid. Continue.

## 2019-04-23 NOTE — Assessment & Plan Note (Addendum)
Anticipate L knee osteoarthritis. Discussed vit D, glucosamine, continued aleve and topical voltaren, and returning for knee steroid injection and xray. He agrees with plan. Handicap placard filled out per pt request.

## 2019-04-23 NOTE — Assessment & Plan Note (Signed)
Levels repleted on vit D 1000 IU supplementation.

## 2019-04-23 NOTE — Progress Notes (Signed)
This visit was conducted in person.  BP 136/76 (BP Location: Left Arm, Patient Position: Sitting, Cuff Size: Large)   Pulse 75   Temp 98.1 F (36.7 C) (Temporal)   Ht 5' 5.5" (1.664 m)   Wt 248 lb 3 oz (112.6 kg)   SpO2 96%   BMI 40.67 kg/m    CC: CPE Subjective:    Patient ID: James Robbins, male    DOB: 1950-11-11, 68 y.o.   MRN: [id number]  HPI: James Robbins is a 68 y.o. male presenting on 04/23/2019 for Annual Exam   Only has medicare part A.   Saw [facility] last year with 30 lb weight loss. He has stopped going to gym.   Suffering with more arthritis since he turned 68 - especially at L knee but B knees and ankles. No redness/swelling/warmth, inciting trauma/injury. Treating with fish oil tablets and aleve 220mg  bid and topical voltaren gel. He has started using cane. Keeping legs warm helps. Asks about handicap placard.   Preventative: Colon cancer screening - colonoscopy 2006 normal. Denies BM changes. No blood in stool. Last colonoscopy woke up early. Leery of colonoscopies. Cologuard negative 04/2018  Prostate cancer screening - Discussed. Continues blood test.  [facility] - [facility]  [place] - 02/2014, pneumovax2/2017  zostavax 2012  shingrix - discussed Advanced directives - does not have set up. Would want wife to be HCPOA. Packet provided today. Seat belt use discussed  Sunscreen use discussed, no changing moles  Ex smoker quit 1987 Alcohol - none Dentist yearly Eye exam overdue  Bowel - no constipation Bladder - no incontinence  Lives with wife and children, no pets  Occupation: retired Chemical engineer  Edu: HS  Activity: no regular exercise, joined Y Diet: good water, fruits/vegetables daily, stopped sodas     Relevant past medical, surgical, family and social history reviewed and updated as indicated. Interim medical history since our last visit reviewed. Allergies and medications reviewed and updated. Outpatient Medications  Prior to Visit  Medication Sig Dispense Refill  . aspirin 81 MG tablet Take 81 mg by mouth daily.    . cetirizine (ZYRTEC) 10 MG tablet Take 10 mg by mouth daily.    . Cholecalciferol (VITAMIN D3) 1000 units CAPS Take 1 capsule (1,000 Units total) by mouth daily. 30 capsule   . diclofenac Sodium (VOLTAREN) 1 % GEL Apply topically as needed.    . Naproxen Sodium (ALEVE PO) Take 1 tablet by mouth 2 (two) times daily. Aleve Arthritis     . enalapril (VASOTEC) 20 MG tablet Take 1 tablet (20 mg total) by mouth daily. 90 tablet 1  . metFORMIN (GLUCOPHAGE) 1000 MG tablet Take 1 tablet (1,000 mg total) by mouth 2 (two) times daily with a meal. 180 tablet 3  . Omega-3 Fatty Acids (FISH OIL OMEGA-3 PO) Take 1 capsule by mouth daily. 1400 mg    . simvastatin (ZOCOR) 40 MG tablet Take 1 tablet (40 mg total) by mouth at bedtime. 90 tablet 3   No facility-administered medications prior to visit.      Per HPI unless specifically indicated in ROS section below Review of Systems Objective:    BP 136/76 (BP Location: Left Arm, Patient Position: Sitting, Cuff Size: Large)   Pulse 75   Temp 98.1 F (36.7 C) (Temporal)   Ht 5' 5.5" (1.664 m)   Wt 248 lb 3 oz (112.6 kg)   SpO2 96%   BMI 40.67 kg/m  Wt Readings from Last 3 Encounters:  04/23/19 248 lb 3 oz (112.6 kg)  08/14/18 239 lb (108.4 kg)  04/15/18 233 lb 4 oz (105.8 kg)    Physical Exam Vitals signs and nursing note reviewed.  Constitutional:      General: He is not in acute distress.    Appearance: Normal appearance. He is well-developed. He is obese. He is not ill-appearing.  HENT:     Head: Normocephalic and atraumatic.     Right Ear: Hearing, tympanic membrane, ear canal and external ear normal.     Left Ear: Hearing, tympanic membrane, ear canal and external ear normal.     Nose: Nose normal.     Mouth/Throat:     Mouth: Mucous membranes are moist.     Pharynx: Oropharynx is clear. Uvula midline. No posterior oropharyngeal  erythema.  Eyes:     General: No scleral icterus.    Extraocular Movements: Extraocular movements intact.     Conjunctiva/sclera: Conjunctivae normal.     Pupils: Pupils are equal, round, and reactive to light.  Neck:     Musculoskeletal: Normal range of motion and neck supple.  Cardiovascular:     Rate and Rhythm: Normal rate and regular rhythm.     Pulses: Normal pulses.          Radial pulses are 2+ on the right side and 2+ on the left side.     Heart sounds: Normal heart sounds. No murmur.  Pulmonary:     Effort: Pulmonary effort is normal. No respiratory distress.     Breath sounds: Normal breath sounds. No wheezing, rhonchi or rales.  Abdominal:     General: Abdomen is flat. Bowel sounds are normal. There is no distension.     Palpations: Abdomen is soft. There is no mass.     Tenderness: There is no abdominal tenderness. There is no guarding or rebound.     Hernia: No hernia is present.  Musculoskeletal: Normal range of motion.        General: Swelling and tenderness present.     Right lower leg: No edema.     Left lower leg: No edema.     Comments:  L knee swelling present anteriorly Tender to palpation medial joint line, some crepitus with ROM flexion/extension.  No popliteal fullness, redness.  R knee WNL  Lymphadenopathy:     Cervical: No cervical adenopathy.  Skin:    General: Skin is warm and dry.     Findings: No rash.  Neurological:     General: No focal deficit present.     Mental Status: He is alert and oriented to person, place, and time.     Comments: CN grossly intact, station and gait intact  Psychiatric:        Mood and Affect: Mood normal.        Behavior: Behavior normal.        Thought Content: Thought content normal.        Judgment: Judgment normal.       Results for orders placed or performed in visit on 04/16/19  Hepatitis C Antibody  Result Value Ref Range   Hepatitis C Ab NON-REACTIVE NON-REACTI   SIGNAL TO CUT-OFF 0.00 <1.00  PSA   Result Value Ref Range   PSA 0.80 0.10 - 4.00 ng/mL  vit d  Result Value Ref Range   VITD 40.04 30.00 - 100.00 ng/mL  Hemoglobin A1c  Result Value Ref Range   Hgb A1c MFr Bld  6.8 (H) 4.6 - 6.5 %  Comprehensive metabolic panel  Result Value Ref Range   Sodium 139 135 - 145 mEq/L   Potassium 4.3 3.5 - 5.1 mEq/L   Chloride 101 96 - 112 mEq/L   CO2 30 19 - 32 mEq/L   Glucose, Bld 156 (H) 70 - 99 mg/dL   BUN 18 6 - 23 mg/dL   Creatinine, Ser 0.96 0.40 - 1.50 mg/dL   Total Bilirubin 0.5 0.2 - 1.2 mg/dL   Alkaline Phosphatase 53 39 - 117 U/L   AST 19 0 - 37 U/L   ALT 26 0 - 53 U/L   Total Protein 6.3 6.0 - 8.3 g/dL   Albumin 4.3 3.5 - 5.2 g/dL   GFR 77.38 >60.00 mL/min   Calcium 9.6 8.4 - 10.5 mg/dL  Lipid panel  Result Value Ref Range   Cholesterol 139 0 - 200 mg/dL   Triglycerides 146.0 0.0 - 149.0 mg/dL   HDL 40.60 >39.00 mg/dL   VLDL 29.2 0.0 - 40.0 mg/dL   LDL Cholesterol 69 0 - 99 mg/dL   Total CHOL/HDL Ratio 3    NonHDL 98.46    Assessment & Plan:   Problem List Items Addressed This Visit    Vitamin D deficiency    Levels repleted on vit D 1000 IU supplementation.       Obesity, morbid, BMI 40.0-49.9 ([place])    Encouraged health diet and return to regular exercise routine to affect sustainable weight loss.       Relevant Medications   metFORMIN (GLUCOPHAGE) 1000 MG tablet   Left knee pain    Anticipate L knee osteoarthritis. Discussed vit D, glucosamine, continued aleve and topical voltaren, and returning for knee steroid injection and xray. He agrees with plan. Handicap placard filled out per pt request.       Hypertension associated with diabetes ([place])    Chronic, stable. Continue current regimen.       Relevant Medications   enalapril (VASOTEC) 20 MG tablet   metFORMIN (GLUCOPHAGE) 1000 MG tablet   simvastatin (ZOCOR) 40 MG tablet   Hyperlipidemia associated with type 2 diabetes mellitus (HCC)    Chronic, stable. Continue current regimen. The 10-year  ASCVD risk score Mikey Bussing DC Brooke Bonito., et al., 2013) is: 36.7%   Values used to calculate the score:     Age: 65 years     Sex: Male     Is Non-Hispanic African American: No     Diabetic: Yes     Tobacco smoker: No     Systolic Blood Pressure: [id number] mmHg     Is BP treated: Yes     HDL Cholesterol: 40.6 mg/dL     Total Cholesterol: 139 mg/dL       Relevant Medications   enalapril (VASOTEC) 20 MG tablet   metFORMIN (GLUCOPHAGE) 1000 MG tablet   simvastatin (ZOCOR) 40 MG tablet   Health maintenance examination - Primary    Preventative protocols reviewed and updated unless pt declined. Discussed healthy diet and lifestyle.       Controlled type 2 diabetes mellitus with diabetic nephropathy (HCC)    Chronic, stable on metformin 1000mg  bid. Continue.       Relevant Medications   enalapril (VASOTEC) 20 MG tablet   metFORMIN (GLUCOPHAGE) 1000 MG tablet   simvastatin (ZOCOR) 40 MG tablet   CKD stage 1 due to type 2 diabetes mellitus (HCC)    Continue enalapril 40mg  daily.      Relevant Medications  enalapril (VASOTEC) 20 MG tablet   metFORMIN (GLUCOPHAGE) 1000 MG tablet   simvastatin (ZOCOR) 40 MG tablet   Advanced care planning/counseling discussion    Advanced directives - does not have set up. Would want wife to be HCPOA. Provided today.          Meds ordered this encounter  Medications  . Glucosamine-Chondroitin (OSTEO BI-FLEX REGULAR STRENGTH) 250-200 MG TABS    Sig: Take 1 tablet by mouth daily.    Refill:  0  . enalapril (VASOTEC) 20 MG tablet    Sig: Take 1 tablet (20 mg total) by mouth daily.    Dispense:  90 tablet    Refill:  3  . metFORMIN (GLUCOPHAGE) 1000 MG tablet    Sig: Take 1 tablet (1,000 mg total) by mouth 2 (two) times daily with a meal.    Dispense:  180 tablet    Refill:  3  . simvastatin (ZOCOR) 40 MG tablet    Sig: Take 1 tablet (40 mg total) by mouth at bedtime.    Dispense:  90 tablet    Refill:  3   No orders of the defined types were placed  in this encounter.   Follow up plan: Return in about 1 year (around 04/22/2020) for annual exam, prior fasting for blood work.  Ria Bush, MD

## 2019-04-23 NOTE — Patient Instructions (Addendum)
If interested, check with pharmacy about new 2 shot shingles series (shingrix).  Advanced directive packet provided today.  L knee pain likely from wear and tear arthritis. Return at your convenience for steroid injection into the knee and xrays. Handicap placard provided today.  Good to see you today, call us with questions.   Health Maintenance After Age 70 After age 56, you are at a higher risk for certain long-term diseases and infections as well as injuries from falls. Falls are a major cause of broken bones and head injuries in people who are older than age 22. Getting regular preventive care can help to keep you healthy and well. Preventive care includes getting regular testing and making lifestyle changes as recommended by your health care provider. Talk with your health care provider about:  Which screenings and tests you should have. A screening is a test that checks for a disease when you have no symptoms.  A diet and exercise plan that is right for you. What should I know about screenings and tests to prevent falls? Screening and testing are the best ways to find a health problem early. Early diagnosis and treatment give you the best chance of managing medical conditions that are common after age 32. Certain conditions and lifestyle choices may make you more likely to have a fall. Your health care provider may recommend:  Regular vision checks. Poor vision and conditions such as cataracts can make you more likely to have a fall. If you wear glasses, make sure to get your prescription updated if your vision changes.  Medicine review. Work with your health care provider to regularly review all of the medicines you are taking, including over-the-counter medicines. Ask your health care provider about any side effects that may make you more likely to have a fall. Tell your health care provider if any medicines that you take make you feel dizzy or sleepy.  Osteoporosis screening. Osteoporosis  is a condition that causes the bones to get weaker. This can make the bones weak and cause them to break more easily.  Blood pressure screening. Blood pressure changes and medicines to control blood pressure can make you feel dizzy.  Strength and balance checks. Your health care provider may recommend certain tests to check your strength and balance while standing, walking, or changing positions.  Foot health exam. Foot pain and numbness, as well as not wearing proper footwear, can make you more likely to have a fall.  Depression screening. You may be more likely to have a fall if you have a fear of falling, feel emotionally low, or feel unable to do activities that you used to do.  Alcohol use screening. Using too much alcohol can affect your balance and may make you more likely to have a fall. What actions can I take to lower my risk of falls? General instructions  Talk with your health care provider about your risks for falling. Tell your health care provider if: ? You fall. Be sure to tell your health care provider about all falls, even ones that seem minor. ? You feel dizzy, sleepy, or off-balance.  Take over-the-counter and prescription medicines only as told by your health care provider. These include any supplements.  Eat a healthy diet and maintain a healthy weight. A healthy diet includes low-fat dairy products, low-fat (lean) meats, and fiber from whole grains, beans, and lots of fruits and vegetables. Home safety  Remove any tripping hazards, such as rugs, cords, and clutter.  Install safety  equipment such as grab bars in bathrooms and safety rails on stairs.  Keep rooms and walkways well-lit. Activity   Follow a regular exercise program to stay fit. This will help you maintain your balance. Ask your health care provider what types of exercise are appropriate for you.  If you need a cane or walker, use it as recommended by your health care provider.  Wear supportive  shoes that have nonskid soles. Lifestyle  Do not drink alcohol if your health care provider tells you not to drink.  If you drink alcohol, limit how much you have: ? 0-1 drink a day for women. ? 0-2 drinks a day for men.  Be aware of how much alcohol is in your drink. In the [place] one drink equals one typical bottle of beer (12 oz), one-half glass of wine (5 oz), or one shot of hard liquor (1 oz).  Do not use any products that contain nicotine or tobacco, such as cigarettes and e-cigarettes. If you need help quitting, ask your health care provider. Summary  Having a healthy lifestyle and getting preventive care can help to protect your health and wellness after age 56.  Screening and testing are the best way to find a health problem early and help you avoid having a fall. Early diagnosis and treatment give you the best chance for managing medical conditions that are more common for people who are older than age 67.  Falls are a major cause of broken bones and head injuries in people who are older than age 59. Take precautions to prevent a fall at home.  Work with your health care provider to learn what changes you can make to improve your health and wellness and to prevent falls. This information is not intended to replace advice given to you by your health care provider. Make sure you discuss any questions you have with your health care provider. Document Released: 04/09/2017 Document Revised: 09/17/2018 Document Reviewed: 04/09/2017 Elsevier Patient Education  2020 [employer].

## 2019-04-23 NOTE — Assessment & Plan Note (Signed)
Continue enalapril 40mg  daily.

## 2019-04-23 NOTE — Assessment & Plan Note (Signed)
Chronic, stable. Continue current regimen. The 10-year ASCVD risk score Mikey Bussing DC Brooke Bonito., et al., 2013) is: 36.7%   Values used to calculate the score:     Age: 68 years     Sex: Male     Is Non-Hispanic African American: No     Diabetic: Yes     Tobacco smoker: No     Systolic Blood Pressure: [id number] mmHg     Is BP treated: Yes     HDL Cholesterol: 40.6 mg/dL     Total Cholesterol: 139 mg/dL

## 2019-04-23 NOTE — Assessment & Plan Note (Signed)
Advanced directives - does not have set up. Would want wife to be HCPOA. Provided today.

## 2019-04-23 NOTE — Assessment & Plan Note (Signed)
Encouraged health diet and return to regular exercise routine to affect sustainable weight loss.

## 2019-05-04 ENCOUNTER — Other Ambulatory Visit: Payer: Self-pay

## 2019-05-04 ENCOUNTER — Ambulatory Visit (INDEPENDENT_AMBULATORY_CARE_PROVIDER_SITE_OTHER)
Admission: RE | Admit: 2019-05-04 | Discharge: 2019-05-04 | Disposition: A | Discharge status: Home / Self Care | Payer: PRIVATE HEALTH INSURANCE | Source: Ambulatory Visit | Attending: Family Medicine | Admitting: Family Medicine

## 2019-05-04 ENCOUNTER — Ambulatory Visit
Admission: RE | Admit: 2019-05-04 | Discharge: 2019-05-04 | Disposition: A | Discharge status: Home / Self Care | Payer: PRIVATE HEALTH INSURANCE | Source: Ambulatory Visit | Attending: Family Medicine | Admitting: Family Medicine

## 2019-05-04 ENCOUNTER — Encounter: Payer: Self-pay | Admitting: Family Medicine

## 2019-05-04 ENCOUNTER — Ambulatory Visit: Payer: PRIVATE HEALTH INSURANCE | Admitting: Family Medicine

## 2019-05-04 VITALS — BP 158/88 | HR 64 | Temp 97.7°F | Ht 65.5 in | Wt 247.3 lb

## 2019-05-04 DIAGNOSIS — E1159 Type 2 diabetes mellitus with other circulatory complications: Secondary | ICD-10-CM

## 2019-05-04 DIAGNOSIS — G8929 Other chronic pain: Secondary | ICD-10-CM

## 2019-05-04 DIAGNOSIS — M25562 Pain in left knee: Secondary | ICD-10-CM | POA: Diagnosis not present

## 2019-05-04 DIAGNOSIS — M25561 Pain in right knee: Secondary | ICD-10-CM | POA: Diagnosis not present

## 2019-05-04 DIAGNOSIS — I10 Essential (primary) hypertension: Secondary | ICD-10-CM

## 2019-05-04 DIAGNOSIS — I152 Hypertension secondary to endocrine disorders: Secondary | ICD-10-CM

## 2019-05-04 MED ORDER — METHYLPREDNISOLONE ACETATE 40 MG/ML IJ SUSP
40.0000 mg | Freq: Once | INTRAMUSCULAR | Status: AC
  Administered 2019-05-04: 40 mg via INTRAMUSCULAR

## 2019-05-04 NOTE — Assessment & Plan Note (Signed)
Anticipate OA related pain.  Treat with L knee steroid injection. Pt tolerated well. Update films today. Discussed possible ortho referral pending steroid injection effect.

## 2019-05-04 NOTE — Addendum Note (Signed)
Addended by: Brenton Grills on: [id number] 10:41 AM   Modules accepted: Orders

## 2019-05-04 NOTE — Patient Instructions (Addendum)
Knee xrays today Steroid shot into left knee today.  Let us know if any questions or concerns.   Knee Injection A knee injection is a procedure to get medicine into your knee joint to relieve the pain, swelling, and stiffness of arthritis. Your health care provider uses a needle to inject medicine, which may also help to lubricate and cushion your knee joint. You may need more than one injection. Tell a health care provider about:  Any allergies you have.  All medicines you are taking, including vitamins, herbs, eye drops, creams, and over-the-counter medicines.  Any problems you or family members have had with anesthetic medicines.  Any blood disorders you have.  Any surgeries you have had.  Any medical conditions you have.  Whether you are pregnant or may be pregnant. What are the risks? Generally, this is a safe procedure. However, problems may occur, including:  Infection.  Bleeding.  Symptoms that get worse.  Damage to the area around your knee.  Allergic reaction to any of the medicines.  Skin reactions from repeated injections. What happens before the procedure?  Ask your health care provider about changing or stopping your regular medicines. This is especially important if you are taking diabetes medicines or blood thinners.  Plan to have someone take you home from the hospital or clinic. What happens during the procedure?   You will sit or lie down in a position for your knee to be treated.  The skin over your kneecap will be cleaned with a germ-killing soap.  You will be given a medicine that numbs the area (local anesthetic). You may feel some stinging.  The medicine will be injected into your knee. The needle is carefully placed between your kneecap and your knee. The medicine is injected into the joint space.  The needle will be removed at the end of the procedure.  A bandage (dressing) may be placed over the injection site. The procedure may vary  among health care providers and hospitals. What can I expect after the procedure?  Your blood pressure, heart rate, breathing rate, and blood oxygen level will be monitored until you leave the hospital or clinic.  You may have to move your knee through its full range of motion. This helps to get all the medicine into your joint space.  You will be watched to make sure that you do not have a reaction to the injected medicine.  You may feel more pain, swelling, and warmth than you did before the injection. This reaction may last about 1-2 days. Follow these instructions at home: Medicines  Take over-the-counter and prescription medicines only as told by your doctor.  Do not drive or use heavy machinery while taking prescription pain medicine.  Do not take medicines such as aspirin and ibuprofen unless your health care provider tells you to take them. Injection site care  Follow instructions from your health care provider about: ? How to take care of your puncture site. ? When and how you should change your dressing. ? When you should remove your dressing.  Check your injection area every day for signs of infection. Check for: ? More redness, swelling, or pain after 2 days. ? Fluid or blood. ? Pus or a bad smell. ? Warmth. Managing pain, stiffness, and swelling   If directed, put ice on the injection area: ? Put ice in a plastic bag. ? Place a towel between your skin and the bag. ? Leave the ice on for 20 minutes, 2-3  times per day.  Do not apply heat to your knee.  Raise (elevate) the injection area above the level of your heart while you are sitting or lying down. General instructions  If you were given a dressing, keep it dry until your health care provider says it can be removed. Ask your health care provider when you can start showering or taking a bath.  Avoid strenuous activities for as long as directed by your health care provider. Ask your health care provider when  you can return to your normal activities.  Keep all follow-up visits as told by your health care provider. This is important. You may need more injections. Contact a health care provider if you have:  A fever.  Warmth in your injection area.  Fluid, blood, or pus coming from your injection site.  Symptoms at your injection site that last longer than 2 days after your procedure. Get help right away if:  Your knee: ? Turns very red. ? Becomes very swollen. ? Is in severe pain. Summary  A knee injection is a procedure to get medicine into your knee joint to relieve the pain, swelling, and stiffness of arthritis.  A needle is carefully placed between your kneecap and your knee to inject medicine into the joint space.  Before the procedure, ask your health care provider about changing or stopping your regular medicines, especially if you are taking diabetes medicines or blood thinners.  Contact your health care provider if you have any problems or questions after your procedure. This information is not intended to replace advice given to you by your health care provider. Make sure you discuss any questions you have with your health care provider. Document Released: 08/18/2006 Document Revised: 06/16/2017 Document Reviewed: 06/16/2017 Elsevier Patient Education  2020 [employer].

## 2019-05-04 NOTE — Assessment & Plan Note (Addendum)
Has not yet taken enalapril - advised take this when he gets home.

## 2019-05-04 NOTE — Progress Notes (Addendum)
This visit was conducted in person.  BP (!) 158/88 (BP Location: Right Arm, Patient Position: Sitting, Cuff Size: Large)   Pulse 64   Temp 97.7 F (36.5 C) (Temporal)   Ht 5' 5.5" (1.664 m)   Wt 247 lb 5 oz (112.2 kg)   SpO2 94%   BMI 40.53 kg/m   BP Readings from Last 3 Encounters:  05/04/19 (!) 158/88  04/23/19 136/76  08/14/18 (!) 154/80   elevated on repeat.  CC: knee pain Subjective:    Patient ID: James Robbins, male    DOB: 11/24/1950, 68 y.o.   MRN: [id number]  HPI: James Robbins is a 68 y.o. male presenting on 05/04/2019 for Injections (Here for inj in right knee. )   Seen earlier this month for physical. Endorsed worsening L>R knee pain. No inciting trauma/injury. No redness or warmth of knee. Mild swelling.   Known diabetic, doesn't check sugars. BP elevated today - didn't take am meds this morning yet.   From prior note: Suffering with more arthritis since he turned 68 - especially at L knee but B knees and ankles. No redness/swelling/warmth, inciting trauma/injury. Treating with fish oil tablets and aleve 220mg  bid and topical voltaren gel. He has started using cane. Keeping legs warm helps. Asks about handicap placard.      Relevant past medical, surgical, family and social history reviewed and updated as indicated. Interim medical history since our last visit reviewed. Allergies and medications reviewed and updated. Outpatient Medications Prior to Visit  Medication Sig Dispense Refill  . aspirin 81 MG tablet Take 81 mg by mouth daily.    . cetirizine (ZYRTEC) 10 MG tablet Take 10 mg by mouth daily.    . Cholecalciferol (VITAMIN D3) 1000 units CAPS Take 1 capsule (1,000 Units total) by mouth daily. 30 capsule   . diclofenac Sodium (VOLTAREN) 1 % GEL Apply topically as needed.    . enalapril (VASOTEC) 20 MG tablet Take 1 tablet (20 mg total) by mouth daily. 90 tablet 3  . Glucosamine-Chondroitin (OSTEO BI-FLEX REGULAR STRENGTH) 250-200 MG TABS Take 1  tablet by mouth daily.  0  . metFORMIN (GLUCOPHAGE) 1000 MG tablet Take 1 tablet (1,000 mg total) by mouth 2 (two) times daily with a meal. 180 tablet 3  . Naproxen Sodium (ALEVE PO) Take 1 tablet by mouth 2 (two) times daily. Aleve Arthritis     . simvastatin (ZOCOR) 40 MG tablet Take 1 tablet (40 mg total) by mouth at bedtime. 90 tablet 3   No facility-administered medications prior to visit.      Per HPI unless specifically indicated in ROS section below Review of Systems Objective:    BP (!) 158/88 (BP Location: Right Arm, Patient Position: Sitting, Cuff Size: Large)   Pulse 64   Temp 97.7 F (36.5 C) (Temporal)   Ht 5' 5.5" (1.664 m)   Wt 247 lb 5 oz (112.2 kg)   SpO2 94%   BMI 40.53 kg/m   Wt Readings from Last 3 Encounters:  05/04/19 247 lb 5 oz (112.2 kg)  04/23/19 248 lb 3 oz (112.6 kg)  08/14/18 239 lb (108.4 kg)    Physical Exam Vitals signs and nursing note reviewed.  Constitutional:      General: He is not in acute distress.    Appearance: Normal appearance. He is obese. He is not ill-appearing.  Musculoskeletal: Normal range of motion.        General: Swelling and tenderness (L>R knee) present.  Right lower leg: No edema.     Left lower leg: No edema.     Comments: R knee with crepitus and mild discomfort to palpation anterior knee L knee exam: No deformity on inspection. Discomfort to palpation of anterior knee  Knee swelling noted. FROM in flex/extension with some crepitus. No popliteal fullness. Neg drawer test. Neg mcmurray test. No pain with valgus/varus stress. No PFgrind. No abnormal patellar mobility.   Neurological:     Mental Status: He is alert.       L knee steroid injection IC obtained and in chart. Landmarks palpated. Skin cleaned with alcohol wipes. Anesthesia achieved with ethyl chloride. Using lateral inferior approach with knee flexed, 1cc methylprednisolone 40mg  and 4 cc lidocaine 1% injected into knee joint using 25g 1.5 "  needle. Pt tolerated well.  Post procedure instructions provided  Lab Results  Component Value Date   HGBA1C 6.8 (H) 04/16/2019    Assessment & Plan:  This visit occurred during the SARS-CoV-2 public health emergency.  Safety protocols were in place, including screening questions prior to the visit, additional usage of staff PPE, and extensive cleaning of exam room while observing appropriate contact time as indicated for disinfecting solutions.   Problem List Items Addressed This Visit    Left knee pain - Primary    Anticipate OA related pain.  Treat with L knee steroid injection. Pt tolerated well. Update films today. Discussed possible ortho referral pending steroid injection effect.      Relevant Orders   DG Knee Complete 4 Views Left   Hypertension associated with diabetes ([place])    Has not yet taken enalapril - advised take this when he gets home.        Other Visit Diagnoses    Chronic pain of right knee       Relevant Medications   methylPREDNISolone acetate (DEPO-MEDROL) injection 40 mg (Completed)   Other Relevant Orders   DG Knee Complete 4 Views Right       Meds ordered this encounter  Medications  . methylPREDNISolone acetate (DEPO-MEDROL) injection 40 mg   Orders Placed This Encounter  Procedures  . DG Knee Complete 4 Views Left    Standing Status:   Future    Number of Occurrences:   1    Standing Expiration Date:   07/03/2020    Order Specific Question:   Reason for Exam (SYMPTOM  OR DIAGNOSIS REQUIRED)    Answer:   L>R knee pain presumed OA related    Order Specific Question:   Preferred imaging location?    Answer:   [facility]    Order Specific Question:   Radiology Contrast Protocol - do NOT remove file path    Answer:   \\[email or web address]  . DG Knee Complete 4 Views Right    Standing Status:   Future    Number of Occurrences:   1    Standing Expiration Date:   07/03/2020    Order Specific  Question:   Reason for Exam (SYMPTOM  OR DIAGNOSIS REQUIRED)    Answer:   L>R knee pain presumed OA related    Order Specific Question:   Preferred imaging location?    Answer:   [facility]    Order Specific Question:   Radiology Contrast Protocol - do NOT remove file path    Answer:   \\[email or web address]    Follow up plan: Return if symptoms worsen or fail to improve.  Ria Bush, MD

## 2019-05-07 ENCOUNTER — Encounter: Payer: Self-pay | Admitting: Family Medicine

## 2019-06-08 ENCOUNTER — Encounter: Payer: Self-pay | Admitting: Family Medicine

## 2019-06-08 ENCOUNTER — Ambulatory Visit: Payer: PRIVATE HEALTH INSURANCE | Admitting: Family Medicine

## 2019-06-08 ENCOUNTER — Other Ambulatory Visit: Payer: Self-pay

## 2019-06-08 DIAGNOSIS — M170 Bilateral primary osteoarthritis of knee: Secondary | ICD-10-CM

## 2019-06-08 MED ORDER — METHYLPREDNISOLONE ACETATE 40 MG/ML IJ SUSP
40.0000 mg | Freq: Once | INTRAMUSCULAR | Status: AC
  Administered 2019-06-08: 40 mg via INTRAMUSCULAR

## 2019-06-08 NOTE — Assessment & Plan Note (Signed)
R knee steroid injection performed today. Had good response to L injection last month. Discussed viscosupplementation and knee replacement options. He wants to avoid surgery if possible. Advised to limit sugar/carb after steroid shot due to hyperglycemic effect.

## 2019-06-08 NOTE — Progress Notes (Signed)
This visit was conducted in person.  BP 136/68 (BP Location: Left Arm, Patient Position: Sitting, Cuff Size: Large)   Pulse 92   Temp 97.7 F (36.5 C) (Temporal)   Ht 5' 5.5" (1.664 m)   Wt 245 lb 2 oz (111.2 kg)   SpO2 95%   BMI 40.17 kg/m    CC: R knee pain Subjective:    Patient ID: James Robbins, male    DOB: [birth year], 68 y.o.   MRN: [id number]  HPI: James Robbins is a 68 y.o. male presenting on 06/08/2019 for Knee Pain (C/o right knee pain.  Had inj in left knee, helpful.  Requesting right knee inj. )   Known bilateral knee OA.  Seen last month with injection into L knee with benefit. Requests R knee injection today. Aware this is temporizing measure. Declines ortho eval at this time.  Points to anteriomedial R knee - worse pain with activity. Aleve was ineffective. Now taking tylenol arthritis 1300mg  twice daily. This helps some.   Known diabetic, doesn't check sugars.  Lab Results  Component Value Date   HGBA1C 6.8 (H) 04/16/2019     DG Knee Complete 4 Views Right CLINICAL DATA:  68 year old male with bilateral knee pain, left greater than right.  EXAM: RIGHT KNEE - COMPLETE 4+ VIEW  COMPARISON:  Left knee radiograph dated 05/04/2019.  FINDINGS: There is no acute fracture or dislocation. Mild osteopenia. There is osteoarthritic changes of the right knee with narrowing of the medial compartment. More advanced arthritic changes of the medial compartment of the left knee noted. No significant joint effusion. The soft tissues are unremarkable.  IMPRESSION: 1. No acute fracture or dislocation. 2. Osteoarthritic changes of the right knee with narrowing of the medial compartment.  Electronically Signed   By: Anner Crete M.D.   On: 05/04/2019 15:47 DG Knee Complete 4 Views Left CLINICAL DATA:  Knee pain.  EXAM: LEFT KNEE - COMPLETE 4+ VIEW  COMPARISON:  No prior.  FINDINGS: Tricompartment degenerative change most prominent about the  medial compartment. Tibial tuberosity fracture fragment noted. This is corticated and thus most likely old. No acute bony abnormality identified. Small knee joint effusion cannot be excluded.  IMPRESSION: 1. Tricompartment degenerative change, most prominent about the medial compartment.  2. Tibial tuberosity fracture fragment noted, this is most likely old. No acute bony abnormality identified. 3. Small knee joint effusion cannot be excluded.  Electronically Signed   By: Marcello Moores  Register   On: 05/04/2019 15:46       Relevant past medical, surgical, family and social history reviewed and updated as indicated. Interim medical history since our last visit reviewed. Allergies and medications reviewed and updated. Outpatient Medications Prior to Visit  Medication Sig Dispense Refill  . acetaminophen (TYLENOL 8 HOUR ARTHRITIS PAIN) 650 MG CR tablet Take 650 mg by mouth every 8 (eight) hours as needed for pain. Takes 2 tablets every 8 hrs    . aspirin 81 MG tablet Take 81 mg by mouth daily.    . cetirizine (ZYRTEC) 10 MG tablet Take 10 mg by mouth daily.    . Cholecalciferol (VITAMIN D3) 1000 units CAPS Take 1 capsule (1,000 Units total) by mouth daily. 30 capsule   . diclofenac Sodium (VOLTAREN) 1 % GEL Apply topically as needed.    . enalapril (VASOTEC) 20 MG tablet Take 1 tablet (20 mg total) by mouth daily. 90 tablet 3  . Glucosamine-Chondroitin (OSTEO BI-FLEX REGULAR STRENGTH) 250-200 MG TABS Take 1 tablet  by mouth daily.  0  . metFORMIN (GLUCOPHAGE) 1000 MG tablet Take 1 tablet (1,000 mg total) by mouth 2 (two) times daily with a meal. 180 tablet 3  . simvastatin (ZOCOR) 40 MG tablet Take 1 tablet (40 mg total) by mouth at bedtime. 90 tablet 3  . Naproxen Sodium (ALEVE PO) Take 1 tablet by mouth 2 (two) times daily. Aleve Arthritis      No facility-administered medications prior to visit.     Per HPI unless specifically indicated in ROS section below Review of  Systems Objective:    BP 136/68 (BP Location: Left Arm, Patient Position: Sitting, Cuff Size: Large)   Pulse 92   Temp 97.7 F (36.5 C) (Temporal)   Ht 5' 5.5" (1.664 m)   Wt 245 lb 2 oz (111.2 kg)   SpO2 95%   BMI 40.17 kg/m   Wt Readings from Last 3 Encounters:  06/08/19 245 lb 2 oz (111.2 kg)  05/04/19 247 lb 5 oz (112.2 kg)  04/23/19 248 lb 3 oz (112.6 kg)    Physical Exam Vitals and nursing note reviewed.  Constitutional:      Appearance: Normal appearance. He is obese. He is not ill-appearing.  Musculoskeletal:        General: Tenderness (mild) present. No swelling.     Right lower leg: No edema.     Left lower leg: No edema.     Comments:  R knee exam: No deformity on inspection. Discomfort to palpation medial knee  No effusion/swelling noted. FROM in flex/extension with some crepitus. No popliteal fullness.  Skin:    General: Skin is warm and dry.     Findings: No rash.  Neurological:     Mental Status: He is alert.       R knee steroid injection IC obtained and in chart. Landmarks palpated. Skin cleaned with alcohol wipes. Anesthesia achieved with ethyl chloride. Using lateral inferior approach with knee flexed, 1cc methylprednisolone 40mg  and 4 cc lidocaine 1% injected into knee joint using 25g 1.5 " needle. Pt tolerated well.  Post procedure instructions provided  Assessment & Plan:  This visit occurred during the SARS-CoV-2 public health emergency.  Safety protocols were in place, including screening questions prior to the visit, additional usage of staff PPE, and extensive cleaning of exam room while observing appropriate contact time as indicated for disinfecting solutions.   Problem List Items Addressed This Visit    Primary osteoarthritis of knees, bilateral    R knee steroid injection performed today. Had good response to L injection last month. Discussed viscosupplementation and knee replacement options. He wants to avoid surgery if possible.  Advised to limit sugar/carb after steroid shot due to hyperglycemic effect.       Relevant Medications   acetaminophen (TYLENOL 8 HOUR ARTHRITIS PAIN) 650 MG CR tablet       Meds ordered this encounter  Medications  . methylPREDNISolone acetate (DEPO-MEDROL) injection 40 mg   No orders of the defined types were placed in this encounter.  Patient instructions: R knee steroid injection performed today.  Watch for any fever, redness, warmth developing and if that happens return right away.  Hopefully will provide long lasting relief.   Follow up plan: Return if symptoms worsen or fail to improve.  Ria Bush, MD

## 2019-06-08 NOTE — Patient Instructions (Signed)
R knee steroid injection performed today.  Watch for any fever, redness, warmth developing and if that happens return right away.  Hopefully will provide long lasting relief.   Knee Injection A knee injection is a procedure to get medicine into your knee joint to relieve the pain, swelling, and stiffness of arthritis. Your health care provider uses a needle to inject medicine, which may also help to lubricate and cushion your knee joint. You may need more than one injection. Tell a health care provider about:  Any allergies you have.  All medicines you are taking, including vitamins, herbs, eye drops, creams, and over-the-counter medicines.  Any problems you or family members have had with anesthetic medicines.  Any blood disorders you have.  Any surgeries you have had.  Any medical conditions you have.  Whether you are pregnant or may be pregnant. What are the risks? Generally, this is a safe procedure. However, problems may occur, including:  Infection.  Bleeding.  Symptoms that get worse.  Damage to the area around your knee.  Allergic reaction to any of the medicines.  Skin reactions from repeated injections. What happens before the procedure?  Ask your health care provider about changing or stopping your regular medicines. This is especially important if you are taking diabetes medicines or blood thinners.  Plan to have someone take you home from the hospital or clinic. What happens during the procedure?   You will sit or lie down in a position for your knee to be treated.  The skin over your kneecap will be cleaned with a germ-killing soap.  You will be given a medicine that numbs the area (local anesthetic). You may feel some stinging.  The medicine will be injected into your knee. The needle is carefully placed between your kneecap and your knee. The medicine is injected into the joint space.  The needle will be removed at the end of the procedure.  A  bandage (dressing) may be placed over the injection site. The procedure may vary among health care providers and hospitals. What can I expect after the procedure?  Your blood pressure, heart rate, breathing rate, and blood oxygen level will be monitored until you leave the hospital or clinic.  You may have to move your knee through its full range of motion. This helps to get all the medicine into your joint space.  You will be watched to make sure that you do not have a reaction to the injected medicine.  You may feel more pain, swelling, and warmth than you did before the injection. This reaction may last about 1-2 days. Follow these instructions at home: Medicines  Take over-the-counter and prescription medicines only as told by your doctor.  Do not drive or use heavy machinery while taking prescription pain medicine.  Do not take medicines such as aspirin and ibuprofen unless your health care provider tells you to take them. Injection site care  Follow instructions from your health care provider about: ? How to take care of your puncture site. ? When and how you should change your dressing. ? When you should remove your dressing.  Check your injection area every day for signs of infection. Check for: ? More redness, swelling, or pain after 2 days. ? Fluid or blood. ? Pus or a bad smell. ? Warmth. Managing pain, stiffness, and swelling   If directed, put ice on the injection area: ? Put ice in a plastic bag. ? Place a towel between your skin and the  bag. ? Leave the ice on for 20 minutes, 2-3 times per day.  Do not apply heat to your knee.  Raise (elevate) the injection area above the level of your heart while you are sitting or lying down. General instructions  If you were given a dressing, keep it dry until your health care provider says it can be removed. Ask your health care provider when you can start showering or taking a bath.  Avoid strenuous activities for as  long as directed by your health care provider. Ask your health care provider when you can return to your normal activities.  Keep all follow-up visits as told by your health care provider. This is important. You may need more injections. Contact a health care provider if you have:  A fever.  Warmth in your injection area.  Fluid, blood, or pus coming from your injection site.  Symptoms at your injection site that last longer than 2 days after your procedure. Get help right away if:  Your knee: ? Turns very red. ? Becomes very swollen. ? Is in severe pain. Summary  A knee injection is a procedure to get medicine into your knee joint to relieve the pain, swelling, and stiffness of arthritis.  A needle is carefully placed between your kneecap and your knee to inject medicine into the joint space.  Before the procedure, ask your health care provider about changing or stopping your regular medicines, especially if you are taking diabetes medicines or blood thinners.  Contact your health care provider if you have any problems or questions after your procedure. This information is not intended to replace advice given to you by your health care provider. Make sure you discuss any questions you have with your health care provider. Document Released: 08/18/2006 Document Revised: 06/16/2017 Document Reviewed: 06/16/2017 Elsevier Patient Education  2020 [employer].

## 2019-10-29 ENCOUNTER — Ambulatory Visit: Payer: PRIVATE HEALTH INSURANCE | Admitting: Family Medicine

## 2019-11-01 ENCOUNTER — Other Ambulatory Visit: Payer: Self-pay

## 2019-11-01 ENCOUNTER — Ambulatory Visit: Payer: PRIVATE HEALTH INSURANCE | Admitting: Family Medicine

## 2019-11-01 ENCOUNTER — Encounter: Payer: Self-pay | Admitting: Family Medicine

## 2019-11-01 VITALS — BP 140/88 | HR 92 | Temp 97.6°F | Ht 65.5 in | Wt 249.4 lb

## 2019-11-01 DIAGNOSIS — M170 Bilateral primary osteoarthritis of knee: Secondary | ICD-10-CM | POA: Diagnosis not present

## 2019-11-01 DIAGNOSIS — E1121 Type 2 diabetes mellitus with diabetic nephropathy: Secondary | ICD-10-CM

## 2019-11-01 LAB — POCT GLYCOSYLATED HEMOGLOBIN (HGB A1C): Hemoglobin A1C: 6.6 % — AB (ref 4.0–5.6)

## 2019-11-01 MED ORDER — METHYLPREDNISOLONE ACETATE 40 MG/ML IJ SUSP
40.0000 mg | Freq: Once | INTRAMUSCULAR | Status: AC
  Administered 2019-11-01: 40 mg via INTRAMUSCULAR

## 2019-11-01 NOTE — Patient Instructions (Addendum)
Call to schedule appointment with Dr Lorelei Pont (our sports medicine doctor) to discuss other options (gel shots)  Knee Injection A knee injection is a procedure to get medicine into your knee joint to relieve the pain, swelling, and stiffness of arthritis. Your health care provider uses a needle to inject medicine, which may also help to lubricate and cushion your knee joint. You may need more than one injection. Tell a health care provider about:  Any allergies you have.  All medicines you are taking, including vitamins, herbs, eye drops, creams, and over-the-counter medicines.  Any problems you or family members have had with anesthetic medicines.  Any blood disorders you have.  Any surgeries you have had.  Any medical conditions you have.  Whether you are pregnant or may be pregnant. What are the risks? Generally, this is a safe procedure. However, problems may occur, including:  Infection.  Bleeding.  Symptoms that get worse.  Damage to the area around your knee.  Allergic reaction to any of the medicines.  Skin reactions from repeated injections. What happens before the procedure?  Ask your health care provider about changing or stopping your regular medicines. This is especially important if you are taking diabetes medicines or blood thinners.  Plan to have someone take you home from the hospital or clinic. What happens during the procedure?   You will sit or lie down in a position for your knee to be treated.  The skin over your kneecap will be cleaned with a germ-killing soap.  You will be given a medicine that numbs the area (local anesthetic). You may feel some stinging.  The medicine will be injected into your knee. The needle is carefully placed between your kneecap and your knee. The medicine is injected into the joint space.  The needle will be removed at the end of the procedure.  A bandage (dressing) may be placed over the injection site. The  procedure may vary among health care providers and hospitals. What can I expect after the procedure?  Your blood pressure, heart rate, breathing rate, and blood oxygen level will be monitored until you leave the hospital or clinic.  You may have to move your knee through its full range of motion. This helps to get all the medicine into your joint space.  You will be watched to make sure that you do not have a reaction to the injected medicine.  You may feel more pain, swelling, and warmth than you did before the injection. This reaction may last about 1-2 days. Follow these instructions at home: Medicines  Take over-the-counter and prescription medicines only as told by your doctor.  Do not drive or use heavy machinery while taking prescription pain medicine.  Do not take medicines such as aspirin and ibuprofen unless your health care provider tells you to take them. Injection site care  Follow instructions from your health care provider about: ? How to take care of your puncture site. ? When and how you should change your dressing. ? When you should remove your dressing.  Check your injection area every day for signs of infection. Check for: ? More redness, swelling, or pain after 2 days. ? Fluid or blood. ? Pus or a bad smell. ? Warmth. Managing pain, stiffness, and swelling   If directed, put ice on the injection area: ? Put ice in a plastic bag. ? Place a towel between your skin and the bag. ? Leave the ice on for 20 minutes, 2-3 times per  day.  Do not apply heat to your knee.  Raise (elevate) the injection area above the level of your heart while you are sitting or lying down. General instructions  If you were given a dressing, keep it dry until your health care provider says it can be removed. Ask your health care provider when you can start showering or taking a bath.  Avoid strenuous activities for as long as directed by your health care provider. Ask your health  care provider when you can return to your normal activities.  Keep all follow-up visits as told by your health care provider. This is important. You may need more injections. Contact a health care provider if you have:  A fever.  Warmth in your injection area.  Fluid, blood, or pus coming from your injection site.  Symptoms at your injection site that last longer than 2 days after your procedure. Get help right away if:  Your knee: ? Turns very red. ? Becomes very swollen. ? Is in severe pain. Summary  A knee injection is a procedure to get medicine into your knee joint to relieve the pain, swelling, and stiffness of arthritis.  A needle is carefully placed between your kneecap and your knee to inject medicine into the joint space.  Before the procedure, ask your health care provider about changing or stopping your regular medicines, especially if you are taking diabetes medicines or blood thinners.  Contact your health care provider if you have any problems or questions after your procedure. This information is not intended to replace advice given to you by your health care provider. Make sure you discuss any questions you have with your health care provider. Document Revised: 06/16/2017 Document Reviewed: 06/16/2017 Elsevier Patient Education  [place].

## 2019-11-01 NOTE — Progress Notes (Signed)
This visit was conducted in person.  BP 140/88 (BP Location: Left Arm, Patient Position: Sitting, Cuff Size: Large)   Pulse 92   Temp 97.6 F (36.4 C) (Temporal)   Ht 5' 5.5" (1.664 m)   Wt 249 lb 6 oz (113.1 kg)   SpO2 94%   BMI 40.87 kg/m    CC: knee pain Subjective:    Patient ID: James Robbins, male    DOB: 06-12-1950, 69 y.o.   MRN: [id number]  HPI: James Robbins is a 69 y.o. male presenting on 11/01/2019 for Knee Pain (C/o bilateral knee pain.  Requests steroid shot in both knees. ) and Form Completion (Requests [place] DMV Disability Placard form. )   See prior note for details.  Known bilateral primary knee OA. S/p bilateral knee injections 05/2019.  Bilateral knee xrays done 04/2019.   Ongoing knee pain bilaterally L>R. Left knee hurts every step. Right knee intermittently uncomfortable. Finds voltaren gel helps more than other medicines he's tried - using 5-6 times a day.  Lab Results  Component Value Date   HGBA1C 6.6 (A) 11/01/2019       Relevant past medical, surgical, family and social history reviewed and updated as indicated. Interim medical history since our last visit reviewed. Allergies and medications reviewed and updated. Outpatient Medications Prior to Visit  Medication Sig Dispense Refill  . acetaminophen (TYLENOL 8 HOUR ARTHRITIS PAIN) 650 MG CR tablet Take 650 mg by mouth every 8 (eight) hours as needed for pain. Takes 2 tablets every 8 hrs    . aspirin 81 MG tablet Take 81 mg by mouth daily.    . cetirizine (ZYRTEC) 10 MG tablet Take 10 mg by mouth daily.    . Cholecalciferol (VITAMIN D3) 1000 units CAPS Take 1 capsule (1,000 Units total) by mouth daily. 30 capsule   . diclofenac Sodium (VOLTAREN) 1 % GEL Apply topically as needed.    . enalapril (VASOTEC) 20 MG tablet Take 1 tablet (20 mg total) by mouth daily. 90 tablet 3  . Glucosamine-Chondroitin (OSTEO BI-FLEX REGULAR STRENGTH) 250-200 MG TABS Take 1 tablet by mouth daily.  0  . metFORMIN  (GLUCOPHAGE) 1000 MG tablet Take 1 tablet (1,000 mg total) by mouth 2 (two) times daily with a meal. 180 tablet 3  . simvastatin (ZOCOR) 40 MG tablet Take 1 tablet (40 mg total) by mouth at bedtime. 90 tablet 3   No facility-administered medications prior to visit.     Per HPI unless specifically indicated in ROS section below Review of Systems Objective:  BP 140/88 (BP Location: Left Arm, Patient Position: Sitting, Cuff Size: Large)   Pulse 92   Temp 97.6 F (36.4 C) (Temporal)   Ht 5' 5.5" (1.664 m)   Wt 249 lb 6 oz (113.1 kg)   SpO2 94%   BMI 40.87 kg/m   Wt Readings from Last 3 Encounters:  11/01/19 249 lb 6 oz (113.1 kg)  06/08/19 245 lb 2 oz (111.2 kg)  05/04/19 247 lb 5 oz (112.2 kg)      Physical Exam Vitals and nursing note reviewed.  Constitutional:      Appearance: Normal appearance. He is obese. He is not ill-appearing.  Musculoskeletal:        General: Normal range of motion.     Right lower leg: No edema.     Left lower leg: No edema.  Neurological:     Mental Status: He is alert.        DG  Knee Complete 4 Views Right CLINICAL DATA:  69 year old male with bilateral knee pain, left greater than right.  EXAM: RIGHT KNEE - COMPLETE 4+ VIEW  COMPARISON:  Left knee radiograph dated 05/04/2019.  FINDINGS: There is no acute fracture or dislocation. Mild osteopenia. There is osteoarthritic changes of the right knee with narrowing of the medial compartment. More advanced arthritic changes of the medial compartment of the left knee noted. No significant joint effusion. The soft tissues are unremarkable.  IMPRESSION: 1. No acute fracture or dislocation. 2. Osteoarthritic changes of the right knee with narrowing of the medial compartment.  Electronically Signed   By: Anner Crete M.D.   On: 05/04/2019 15:47 DG Knee Complete 4 Views Left CLINICAL DATA:  Knee pain.  EXAM: LEFT KNEE - COMPLETE 4+ VIEW  COMPARISON:  No  prior.  FINDINGS: Tricompartment degenerative change most prominent about the medial compartment. Tibial tuberosity fracture fragment noted. This is corticated and thus most likely old. No acute bony abnormality identified. Small knee joint effusion cannot be excluded.  IMPRESSION: 1. Tricompartment degenerative change, most prominent about the medial compartment.  2. Tibial tuberosity fracture fragment noted, this is most likely old. No acute bony abnormality identified. 3. Small knee joint effusion cannot be excluded.  Electronically Signed   By: Marcello Moores  Register   On: 05/04/2019 15:46   R knee steroid injection IC obtained and in chart. Landmarks palpated. Skin cleaned with alcohol wipes. Anesthesia achieved with ethyl chloride. Using lateral inferior approach with knee flexed, 40mg  depo medrol steroid and 4 cc lidocaine 1% injected into knee joint using 22g 1.5 " needle. Pt tolerated well. Post procedure instructions provided   L knee steroid injection IC obtained and in chart. Landmarks palpated. Skin cleaned with alcohol wipes. Anesthesia achieved with ethyl chloride. Using lateral inferior approach with knee flexed, 40mg  depo medrol steroid and 4 cc lidocaine 1% injected into knee joint using 22g 1.5 " needle. Pt tolerated well. Post procedure instructions provided   Assessment & Plan:  This visit occurred during the SARS-CoV-2 public health emergency.  Safety protocols were in place, including screening questions prior to the visit, additional usage of staff PPE, and extensive cleaning of exam room while observing appropriate contact time as indicated for disinfecting solutions.   Problem List Items Addressed This Visit    Primary osteoarthritis of knees, bilateral    Repeat bilateral steroid injections performed today.  Discussed this is temporizing measure. Will refer to sports med to consider gel shots.  Pt agrees with plan. He wants to defer surgery at this  time.       Controlled type 2 diabetes mellitus with diabetic nephropathy ([place]) - Primary   Relevant Orders   POCT glycosylated hemoglobin (Hb A1C) (Completed)       Meds ordered this encounter  Medications  . methylPREDNISolone acetate (DEPO-MEDROL) injection 40 mg  . methylPREDNISolone acetate (DEPO-MEDROL) injection 40 mg   Orders Placed This Encounter  Procedures  . POCT glycosylated hemoglobin (Hb A1C)    Follow up plan: Return if symptoms worsen or fail to improve.  Ria Bush, MD

## 2019-11-01 NOTE — Assessment & Plan Note (Signed)
Repeat bilateral steroid injections performed today.  Discussed this is temporizing measure. Will refer to sports med to consider gel shots.  Pt agrees with plan. He wants to defer surgery at this time.

## 2019-11-10 ENCOUNTER — Other Ambulatory Visit: Payer: Self-pay

## 2019-11-10 ENCOUNTER — Encounter: Payer: Self-pay | Admitting: Family Medicine

## 2019-11-10 ENCOUNTER — Ambulatory Visit: Payer: PRIVATE HEALTH INSURANCE | Admitting: Family Medicine

## 2019-11-10 VITALS — BP 160/90 | HR 93 | Temp 99.2°F | Ht 65.5 in | Wt 244.1 lb

## 2019-11-10 DIAGNOSIS — M170 Bilateral primary osteoarthritis of knee: Secondary | ICD-10-CM | POA: Diagnosis not present

## 2019-11-10 NOTE — Progress Notes (Signed)
Gunter Conde T. Zylon Creamer, MD, [place]  [facility] and [place] at [facility] [place], [address]  Phone: [phone]  FAX: [phone]  James Robbins - 69 y.o. male  MRN [id number]  Date of Birth: [birth year]  Date: 11/10/2019  PCP: Ria Bush, MD  Referral: Ria Bush, MD  Chief Complaint  Patient presents with  . Knee Pain    Bilateral-Left is worse    This visit occurred during the SARS-CoV-2 public health emergency.  Safety protocols were in place, including screening questions prior to the visit, additional usage of staff PPE, and extensive cleaning of exam room while observing appropriate contact time as indicated for disinfecting solutions.   Subjective:   James Robbins is a 69 y.o. very pleasant male patient with Body mass index is 40.01 kg/m. who presents with the following:  He does have bilateral knee osteoarthritis.  He has had this for some time, and most recently my partner Dr. Danise Mina did do bilateral knee injections on the patient on Nov 01, 2019.  He is here for further evaluation and recommendations.  He is also using some Voltaren gel now, anything that this helps quite a bit.  On some prior injections of his knees with corticosteroid in late of 2020, he did not really think he got much relief, but at this point he is doing quite a bit better.  Today his knees actually feel quite good.  He was interested in talking about potentially getting some viscosupplementation injections.   Review of Systems is noted in the HPI, as appropriate   Objective:   BP (!) 160/90   Pulse 93   Temp 99.2 F (37.3 C) (Temporal)   Ht 5' 5.5" (1.664 m)   Wt 244 lb 2 oz (110.7 kg)   SpO2 97%   BMI 40.01 kg/m    GEN: No acute distress; alert,appropriate. PULM: Breathing comfortably in no respiratory distress PSYCH: Normally interactive.    Bilateral knees: Minimal effusion.  Full  extension and flexion to 120 degrees.  Moderate joint line tenderness and the medial and lateral joint lines.  Stable to varus and valgus stress.  Lachman and anterior drawer testing is negative as well as posterior drawer testing.  Remarkable meniscal testing including McMurray's, flexion pinch are all negative. Strength is 5/5 and he is neurovascularly intact.  Radiology: DG Knee Complete 4 Views Left  Result Date: 05/04/2019 CLINICAL DATA:  Knee pain. EXAM: LEFT KNEE - COMPLETE 4+ VIEW COMPARISON:  No prior. FINDINGS: Tricompartment degenerative change most prominent about the medial compartment. Tibial tuberosity fracture fragment noted. This is corticated and thus most likely old. No acute bony abnormality identified. Small knee joint effusion cannot be excluded. IMPRESSION: 1. Tricompartment degenerative change, most prominent about the medial compartment. 2. Tibial tuberosity fracture fragment noted, this is most likely old. No acute bony abnormality identified. 3. Small knee joint effusion cannot be excluded. Electronically Signed   By: Marcello Moores  Register   On: 05/04/2019 15:46   DG Knee Complete 4 Views Right  Result Date: 05/04/2019 CLINICAL DATA:  69 year old male with bilateral knee pain, left greater than right. EXAM: RIGHT KNEE - COMPLETE 4+ VIEW COMPARISON:  Left knee radiograph dated 05/04/2019. FINDINGS: There is no acute fracture or dislocation. Mild osteopenia. There is osteoarthritic changes of the right knee with narrowing of the medial compartment. More advanced arthritic changes of the medial compartment of the left knee noted. No significant joint  effusion. The soft tissues are unremarkable. IMPRESSION: 1. No acute fracture or dislocation. 2. Osteoarthritic changes of the right knee with narrowing of the medial compartment. Electronically Signed   By: Anner Crete M.D.   On: 05/04/2019 15:47   Both films above were independently reviewed by myself.  Bilaterally the patient  does have tricompartmental arthritis and worst in the medial compartment, and this is worse on the left film compared to the right.  There is also a radiopaque body anterior to the tibial tuberosity which corresponds to a clinical history of a trauma many years ago.  No fractures or dislocations.  Electronically Signed  By: Owens Loffler, MD On: 11/10/2019 10:20 AM EDT   Assessment and Plan:     ICD-10-CM   1. Primary osteoarthritis of knees, bilateral  M17.0    In the acute period, he is relatively nontender and I would hold off on doing any procedure for now.  Continue Voltaren gel and work on range of motion activity as tolerated.  Encouraged his weight loss and quadricep strengthening.  We will have our office check on preferred product in cost for viscosupplementation of bilateral knee osteoarthritis.  He is to call our office when his knees get more symptomatic, and we will obtain the preferred product.  I told him to call [facility] directly.  Follow-up: No follow-ups on file.  Signed,  Maud Deed. Amariss Detamore, MD   Outpatient Encounter Medications as of 11/10/2019  Medication Sig  . acetaminophen (TYLENOL 8 HOUR ARTHRITIS PAIN) 650 MG CR tablet Take 650 mg by mouth every 8 (eight) hours as needed for pain. Takes 2 tablets every 8 hrs  . aspirin 81 MG tablet Take 81 mg by mouth daily.  . cetirizine (ZYRTEC) 10 MG tablet Take 10 mg by mouth daily.  . Cholecalciferol (VITAMIN D3) 1000 units CAPS Take 1 capsule (1,000 Units total) by mouth daily.  . diclofenac Sodium (VOLTAREN) 1 % GEL Apply topically as needed.  . enalapril (VASOTEC) 20 MG tablet Take 1 tablet (20 mg total) by mouth daily.  . Glucosamine-Chondroitin (OSTEO BI-FLEX REGULAR STRENGTH) 250-200 MG TABS Take 1 tablet by mouth daily.  . metFORMIN (GLUCOPHAGE) 1000 MG tablet Take 1 tablet (1,000 mg total) by mouth 2 (two) times daily with a meal.  . simvastatin (ZOCOR) 40 MG tablet Take 1 tablet (40 mg total) by mouth at bedtime.    No facility-administered encounter medications on file as of 11/10/2019.

## 2019-11-10 NOTE — Patient Instructions (Signed)
We will check with your insurance about the preferred product, then call us and set up a viscosupplementation injection appointment.  Call [facility] to set-up the appointment and order your injection product.

## 2019-11-12 ENCOUNTER — Telehealth: Payer: Self-pay

## 2019-11-12 NOTE — Telephone Encounter (Signed)
This is noted, I will review and sign.

## 2019-11-12 NOTE — Telephone Encounter (Signed)
Form completed for [employer] benefit review for patient bilateral injections for OA.   Form printed and will be given to Dr. Lorelei Pont to sign and fax in for review.   Randall An RN taking over processing during my absence next week.

## 2019-11-15 NOTE — Telephone Encounter (Signed)
Form signed by Dr. Lorelei Pont. Faxed form, insurance info and last office note to [phone]

## 2019-11-19 NOTE — Telephone Encounter (Signed)
Received illegible fax reporting product is not covered with medical or pharmacy benefits. Contacted [employer] at [phone], and spoke with Normajean Glasgow, ext [phone]. She reports product is not covered and they have forwarded pt info to [employer] [place]. This is handled through [facility] to see if the pt qualifies for financial assistance. They contact the pt and let pt know of their financial obligation and send med to clinic if pt qualifies.

## 2019-12-24 NOTE — Telephone Encounter (Signed)
On 11/19/19- We were notified that patient's [employer] was identified as not being in network.  Out of pocket expense for injections is 2500.00 per injection.  Patient notified and cannot afford the out of network expense, he will discuss further treatment options with Dr. Lorelei Pont at next visit.

## 2020-01-28 NOTE — Telephone Encounter (Signed)
Asked patient to please call me back when he has time.  I wanted to follow up with patient and see how he is doing with his knee pain.  Also, I did want to be sure that he knew that we were unable to get his Vis[employer] injections covered and ask if he needs to follow up with [facility] further to discuss other available treatments.

## 2020-03-25 ENCOUNTER — Ambulatory Visit: Payer: PRIVATE HEALTH INSURANCE | Attending: Internal Medicine

## 2020-03-25 DIAGNOSIS — Z23 Encounter for immunization: Secondary | ICD-10-CM

## 2020-03-25 NOTE — Progress Notes (Signed)
   Covid-19 Vaccination Clinic  Name:  PAYTON PRINSEN    MRN: [id number] DOB: 1950/10/06  03/25/2020  Mr. Bernstein was observed post Covid-19 immunization for 15 minutes without incident. He was provided with Vaccine Information Sheet and instruction to access the V-Safe system.   Mr. Zieger was instructed to call 911 with any severe reactions post vaccine: Marland Kitchen Difficulty breathing  . Swelling of face and throat  . A fast heartbeat  . A bad rash all over body  . Dizziness and weakness

## 2020-04-23 ENCOUNTER — Other Ambulatory Visit: Payer: Self-pay | Admitting: Family Medicine

## 2020-04-23 DIAGNOSIS — E559 Vitamin D deficiency, unspecified: Secondary | ICD-10-CM

## 2020-04-23 DIAGNOSIS — Z125 Encounter for screening for malignant neoplasm of prostate: Secondary | ICD-10-CM

## 2020-04-23 DIAGNOSIS — E785 Hyperlipidemia, unspecified: Secondary | ICD-10-CM

## 2020-04-23 DIAGNOSIS — E1169 Type 2 diabetes mellitus with other specified complication: Secondary | ICD-10-CM

## 2020-04-23 DIAGNOSIS — E1121 Type 2 diabetes mellitus with diabetic nephropathy: Secondary | ICD-10-CM

## 2020-04-24 ENCOUNTER — Other Ambulatory Visit (INDEPENDENT_AMBULATORY_CARE_PROVIDER_SITE_OTHER): Payer: PRIVATE HEALTH INSURANCE

## 2020-04-24 ENCOUNTER — Other Ambulatory Visit: Payer: Self-pay

## 2020-04-24 DIAGNOSIS — Z125 Encounter for screening for malignant neoplasm of prostate: Secondary | ICD-10-CM | POA: Diagnosis not present

## 2020-04-24 DIAGNOSIS — E559 Vitamin D deficiency, unspecified: Secondary | ICD-10-CM

## 2020-04-24 DIAGNOSIS — E1121 Type 2 diabetes mellitus with diabetic nephropathy: Secondary | ICD-10-CM

## 2020-04-24 DIAGNOSIS — E1169 Type 2 diabetes mellitus with other specified complication: Secondary | ICD-10-CM | POA: Diagnosis not present

## 2020-04-24 DIAGNOSIS — E785 Hyperlipidemia, unspecified: Secondary | ICD-10-CM | POA: Diagnosis not present

## 2020-04-24 LAB — LIPID PANEL
Cholesterol: 124 mg/dL (ref 0–200)
HDL: 41.2 mg/dL (ref >39.00)
LDL Cholesterol: 61 mg/dL (ref 0–99)
NonHDL: 82.52
Total CHOL/HDL Ratio: 3
Triglycerides: 110 mg/dL (ref 0.0–149.0)
VLDL: 22 mg/dL (ref 0.0–40.0)

## 2020-04-24 LAB — COMPREHENSIVE METABOLIC PANEL
ALT: 23 U/L (ref 0–53)
AST: 14 U/L (ref 0–37)
Albumin: 4.1 g/dL (ref 3.5–5.2)
Alkaline Phosphatase: 50 U/L (ref 39–117)
BUN: 16 mg/dL (ref 6–23)
CO2: 32 mEq/L (ref 19–32)
Calcium: 9.5 mg/dL (ref 8.4–10.5)
Chloride: 103 mEq/L (ref 96–112)
Creatinine, Ser: 1.09 mg/dL (ref 0.40–1.50)
GFR: 68.39 mL/min (ref >60.00)
Glucose, Bld: 140 mg/dL — ABNORMAL HIGH (ref 70–99)
Potassium: 4.3 mEq/L (ref 3.5–5.1)
Sodium: 142 mEq/L (ref 135–145)
Total Bilirubin: 0.5 mg/dL (ref 0.2–1.2)
Total Protein: 6.2 g/dL (ref 6.0–8.3)

## 2020-04-24 LAB — VITAMIN D 25 HYDROXY (VIT D DEFICIENCY, FRACTURES): VITD: 25.89 ng/mL — ABNORMAL LOW (ref 30.00–100.00)

## 2020-04-24 LAB — HEMOGLOBIN A1C: Hgb A1c MFr Bld: 7.1 % — ABNORMAL HIGH (ref 4.6–6.5)

## 2020-04-24 LAB — PSA: PSA: 0.79 ng/mL (ref 0.10–4.00)

## 2020-04-26 ENCOUNTER — Ambulatory Visit (INDEPENDENT_AMBULATORY_CARE_PROVIDER_SITE_OTHER): Payer: PRIVATE HEALTH INSURANCE | Admitting: Family Medicine

## 2020-04-26 ENCOUNTER — Other Ambulatory Visit: Payer: Self-pay

## 2020-04-26 ENCOUNTER — Encounter: Payer: Self-pay | Admitting: Family Medicine

## 2020-04-26 VITALS — BP 150/94 | HR 94 | Temp 97.8°F | Ht 67.0 in | Wt 247.6 lb

## 2020-04-26 DIAGNOSIS — I10 Essential (primary) hypertension: Secondary | ICD-10-CM

## 2020-04-26 DIAGNOSIS — E1121 Type 2 diabetes mellitus with diabetic nephropathy: Secondary | ICD-10-CM

## 2020-04-26 DIAGNOSIS — Z7189 Other specified counseling: Secondary | ICD-10-CM

## 2020-04-26 DIAGNOSIS — Z0000 Encounter for general adult medical examination without abnormal findings: Secondary | ICD-10-CM | POA: Diagnosis not present

## 2020-04-26 DIAGNOSIS — E785 Hyperlipidemia, unspecified: Secondary | ICD-10-CM

## 2020-04-26 DIAGNOSIS — E1169 Type 2 diabetes mellitus with other specified complication: Secondary | ICD-10-CM | POA: Diagnosis not present

## 2020-04-26 DIAGNOSIS — E559 Vitamin D deficiency, unspecified: Secondary | ICD-10-CM

## 2020-04-26 DIAGNOSIS — M170 Bilateral primary osteoarthritis of knee: Secondary | ICD-10-CM

## 2020-04-26 DIAGNOSIS — N182 Chronic kidney disease, stage 2 (mild): Secondary | ICD-10-CM

## 2020-04-26 DIAGNOSIS — E1122 Type 2 diabetes mellitus with diabetic chronic kidney disease: Secondary | ICD-10-CM

## 2020-04-26 MED ORDER — METOPROLOL SUCCINATE ER 25 MG PO TB24: 25.0000 mg | ORAL_TABLET | Freq: Every day | ORAL | 3 refills | Status: DC

## 2020-04-26 MED ORDER — SIMVASTATIN 40 MG PO TABS: 40.0000 mg | ORAL_TABLET | Freq: Every day | ORAL | 3 refills | Status: DC

## 2020-04-26 MED ORDER — ENALAPRIL MALEATE 20 MG PO TABS: 20.0000 mg | ORAL_TABLET | Freq: Every day | ORAL | 3 refills | Status: DC

## 2020-04-26 MED ORDER — METFORMIN HCL 1000 MG PO TABS: 1000.0000 mg | ORAL_TABLET | Freq: Two times a day (BID) | ORAL | 3 refills | Status: DC

## 2020-04-26 NOTE — Assessment & Plan Note (Signed)
Elevated readings with deteriorating kidney function despite enalapril 20mg  daily. Will add toprol XL 25mg  daily. RTC 1 mo HTN f/u visit.

## 2020-04-26 NOTE — Progress Notes (Signed)
Patient ID: James Robbins, male    DOB: 06/30/1950, 69 y.o.   MRN: [id number]  This visit was conducted in person.  BP (!) 150/94 (BP Location: Right Arm, Cuff Size: Large)   Pulse 94   Temp 97.8 F (36.6 C) (Temporal)   Ht 5\' 7"  (1.702 m)   Wt 247 lb 9 oz (112.3 kg)   SpO2 94%   BMI 38.77 kg/m   BP Readings from Last 3 Encounters:  04/26/20 (!) 150/94  11/10/19 (!) 160/90  11/01/19 140/88    CC: CPE Subjective:   HPI: James Robbins is a 69 y.o. male presenting on 04/26/2020 for Annual Exam   Only has medicare part A.   BP elevation noted - he doesn't check at home. Compliant with enalapril 20mg  daily. Advised to start checking more regularly. No HA, vision changes, CP/tightness, SOB, leg swelling.   B knee pain - saw Dr Lorelei Pont. Insurance would not cover visco-supplementation. Wears copper fit knee sleeves. Considering knee replacement - wants to postpone. Continues voltaren gel BID.   Preventative: Colon cancer screening - colonoscopy 2006 normal. Denies BM changes. No blood in stool. Last colonoscopy woke up early. Leery of colonoscopies.Cologuard negative 04/2018.  Prostate cancer screening - Discussed. Continues blood test.  Flu -atcityhallyearly  [place] x2 and booster 03/2020  Prevnar - 02/2014, pneumovax2/2017  zostavax 2012 shingrix - discussed  Advanced directives - does not have set up. Would want wife to be HCPOA. Packet provided today. Full code. Wouldn't want prolonged life support if terminal condition.  Seat belt use discussed  Sunscreen use discussed, no changing moles  Ex smoker quit 1987 Alcohol - none Dentist yearly - due Eye exam overdue  Bowel - no constipation Bladder - no incontinence  Lives with wife and children, no pets  Occupation: retired Chemical engineer  Edu: HS  Activity: no regular exercise  Diet: good water, fruits/vegetables daily, stopped sodas     Relevant past medical, surgical, family and social history  reviewed and updated as indicated. Interim medical history since our last visit reviewed. Allergies and medications reviewed and updated. Outpatient Medications Prior to Visit  Medication Sig Dispense Refill  . acetaminophen (TYLENOL 8 HOUR ARTHRITIS PAIN) 650 MG CR tablet Take 650 mg by mouth every 8 (eight) hours as needed for pain. Takes 2 tablets every 8 hrs    . aspirin 81 MG tablet Take 81 mg by mouth daily.    . cetirizine (ZYRTEC) 10 MG tablet Take 10 mg by mouth daily.    . Cholecalciferol (VITAMIN D3) 1000 units CAPS Take 1 capsule (1,000 Units total) by mouth daily. 30 capsule   . diclofenac Sodium (VOLTAREN) 1 % GEL Apply topically as needed.    . enalapril (VASOTEC) 20 MG tablet Take 1 tablet (20 mg total) by mouth daily. 90 tablet 3  . metFORMIN (GLUCOPHAGE) 1000 MG tablet Take 1 tablet (1,000 mg total) by mouth 2 (two) times daily with a meal. 180 tablet 3  . simvastatin (ZOCOR) 40 MG tablet Take 1 tablet (40 mg total) by mouth at bedtime. 90 tablet 3  . Glucosamine-Chondroitin (OSTEO BI-FLEX REGULAR STRENGTH) 250-200 MG TABS Take 1 tablet by mouth daily.  0   No facility-administered medications prior to visit.     Per HPI unless specifically indicated in ROS section below Review of Systems  Constitutional: Negative for activity change, appetite change, chills, fatigue, fever and unexpected weight change.  HENT: Negative for hearing loss.  Eyes: Negative for visual disturbance.  Respiratory: Negative for cough, chest tightness, shortness of breath and wheezing.   Cardiovascular: Negative for chest pain, palpitations and leg swelling.  Gastrointestinal: Negative for abdominal distention, abdominal pain, blood in stool, constipation, diarrhea, nausea and vomiting.  Genitourinary: Negative for difficulty urinating and hematuria.  Musculoskeletal: Negative for arthralgias, myalgias and neck pain.  Skin: Negative for rash.  Neurological: Negative for dizziness, seizures,  syncope and headaches.  Hematological: Negative for adenopathy. Does not bruise/bleed easily.  Psychiatric/Behavioral: Negative for dysphoric mood. The patient is not nervous/anxious.    Objective:  BP (!) 150/94 (BP Location: Right Arm, Cuff Size: Large)   Pulse 94   Temp 97.8 F (36.6 C) (Temporal)   Ht 5\' 7"  (1.702 m)   Wt 247 lb 9 oz (112.3 kg)   SpO2 94%   BMI 38.77 kg/m   Wt Readings from Last 3 Encounters:  04/26/20 247 lb 9 oz (112.3 kg)  11/10/19 244 lb 2 oz (110.7 kg)  11/01/19 249 lb 6 oz (113.1 kg)      Physical Exam Vitals and nursing note reviewed.  Constitutional:      General: He is not in acute distress.    Appearance: Normal appearance. He is well-developed. He is obese. He is not ill-appearing.  HENT:     Head: Normocephalic and atraumatic.     Right Ear: Hearing, tympanic membrane, ear canal and external ear normal.     Left Ear: Hearing, tympanic membrane, ear canal and external ear normal.  Eyes:     General: No scleral icterus.    Extraocular Movements: Extraocular movements intact.     Conjunctiva/sclera: Conjunctivae normal.     Pupils: Pupils are equal, round, and reactive to light.  Neck:     Thyroid: No thyroid mass or thyromegaly.     Vascular: No carotid bruit.  Cardiovascular:     Rate and Rhythm: Normal rate and regular rhythm.     Pulses: Normal pulses.          Radial pulses are 2+ on the right side and 2+ on the left side.     Heart sounds: Normal heart sounds. No murmur heard.   Pulmonary:     Effort: Pulmonary effort is normal. No respiratory distress.     Breath sounds: Normal breath sounds. No wheezing, rhonchi or rales.  Abdominal:     General: Abdomen is flat. Bowel sounds are normal. There is no distension.     Palpations: Abdomen is soft. There is no mass.     Tenderness: There is no abdominal tenderness. There is no guarding or rebound.     Hernia: No hernia is present.  Musculoskeletal:        General: Normal range of  motion.     Cervical back: Normal range of motion and neck supple.     Right lower leg: No edema.     Left lower leg: No edema.  Lymphadenopathy:     Cervical: No cervical adenopathy.  Skin:    General: Skin is warm and dry.     Findings: No rash.  Neurological:     General: No focal deficit present.     Mental Status: He is alert and oriented to person, place, and time.     Comments: CN grossly intact, station and gait intact  Psychiatric:        Mood and Affect: Mood normal.        Behavior: Behavior normal.  Thought Content: Thought content normal.        Judgment: Judgment normal.       Results for orders placed or performed in visit on 04/24/20  VITAMIN D 25 Hydroxy (Vit-D Deficiency, Fractures)  Result Value Ref Range   VITD 25.89 (L) 30.00 - 100.00 ng/mL  PSA  Result Value Ref Range   PSA 0.79 0.10 - 4.00 ng/mL  Hemoglobin A1c  Result Value Ref Range   Hgb A1c MFr Bld 7.1 (H) 4.6 - 6.5 %  Comprehensive metabolic panel  Result Value Ref Range   Sodium 142 135 - 145 mEq/L   Potassium 4.3 3.5 - 5.1 mEq/L   Chloride 103 96 - 112 mEq/L   CO2 32 19 - 32 mEq/L   Glucose, Bld 140 (H) 70 - 99 mg/dL   BUN 16 6 - 23 mg/dL   Creatinine, Ser 1.09 0.40 - 1.50 mg/dL   Total Bilirubin 0.5 0.2 - 1.2 mg/dL   Alkaline Phosphatase 50 39 - 117 U/L   AST 14 0 - 37 U/L   ALT 23 0 - 53 U/L   Total Protein 6.2 6.0 - 8.3 g/dL   Albumin 4.1 3.5 - 5.2 g/dL   GFR 68.39 >60.00 mL/min   Calcium 9.5 8.4 - 10.5 mg/dL  Lipid panel  Result Value Ref Range   Cholesterol 124 0 - 200 mg/dL   Triglycerides 110.0 0 - 149 mg/dL   HDL 41.20 >39.00 mg/dL   VLDL 22.0 0.0 - 40.0 mg/dL   LDL Cholesterol 61 0 - 99 mg/dL   Total CHOL/HDL Ratio 3    NonHDL 82.52    Assessment & Plan:  This visit occurred during the SARS-CoV-2 public health emergency.  Safety protocols were in place, including screening questions prior to the visit, additional usage of staff PPE, and extensive cleaning of exam  room while observing appropriate contact time as indicated for disinfecting solutions.   Problem List Items Addressed This Visit    Vitamin D deficiency    Increase vit D to 2000 IU daily.       Severe obesity (BMI 35.0-39.9) with comorbidity ([place])    Weight gain discussed. Encouraged renewed efforts at non-weight bearing exercise (in h/o severe knee OA)      Relevant Medications   metFORMIN (GLUCOPHAGE) 1000 MG tablet   Primary osteoarthritis of knees, bilateral    Appreciate SM care. He is trying to avoid surgery.       Hypertension    Elevated readings with deteriorating kidney function despite enalapril 20mg  daily. Will add toprol XL 25mg  daily. RTC 1 mo HTN f/u visit.       Relevant Medications   enalapril (VASOTEC) 20 MG tablet   simvastatin (ZOCOR) 40 MG tablet   metoprolol succinate (TOPROL-XL) 25 MG 24 hr tablet   Hyperlipidemia associated with type 2 diabetes mellitus (HCC)    Chronic, stable on simvastatin. Continue  The ASCVD Risk score Mikey Bussing DC Jr., et al., 2013) failed to calculate for the following reasons:   The valid total cholesterol range is 130 to 320 mg/dL       Relevant Medications   enalapril (VASOTEC) 20 MG tablet   metFORMIN (GLUCOPHAGE) 1000 MG tablet   simvastatin (ZOCOR) 40 MG tablet   Health maintenance examination    Preventative protocols reviewed and updated unless pt declined. Discussed healthy diet and lifestyle.       Controlled type 2 diabetes mellitus with diabetic nephropathy (HCC)    Chronic, stable  on metformin 1000mg  bid - continue. A1c adequate. Encouraged limiting night time ice cream.       Relevant Medications   enalapril (VASOTEC) 20 MG tablet   metFORMIN (GLUCOPHAGE) 1000 MG tablet   simvastatin (ZOCOR) 40 MG tablet   CKD stage 2 due to type 2 diabetes mellitus (HCC)    Continues enalapril 20mg  daily, discussed increasing blood pressures and effect on kidney function.       Relevant Medications   enalapril (VASOTEC)  20 MG tablet   metFORMIN (GLUCOPHAGE) 1000 MG tablet   simvastatin (ZOCOR) 40 MG tablet   Advanced care planning/counseling discussion    Advanced directives - does not have set up. Would want wife to be HCPOA. Packet previously provided. Full code. Wouldn't want prolonged life support if terminal condition.           Meds ordered this encounter  Medications  . enalapril (VASOTEC) 20 MG tablet    Sig: Take 1 tablet (20 mg total) by mouth daily.    Dispense:  90 tablet    Refill:  3  . metFORMIN (GLUCOPHAGE) 1000 MG tablet    Sig: Take 1 tablet (1,000 mg total) by mouth 2 (two) times daily with a meal.    Dispense:  180 tablet    Refill:  3  . simvastatin (ZOCOR) 40 MG tablet    Sig: Take 1 tablet (40 mg total) by mouth at bedtime.    Dispense:  90 tablet    Refill:  3  . metoprolol succinate (TOPROL-XL) 25 MG 24 hr tablet    Sig: Take 1 tablet (25 mg total) by mouth daily.    Dispense:  30 tablet    Refill:  3   No orders of the defined types were placed in this encounter.   Patient instructions: Let us know dates of Pfizer vaccines.  If interested, check with pharmacy about new 2 shot shingles series (shingrix).  Work on living will. Bring [place] copy when complete.  Work on regular non-weight bearing aerobic exercise.  Blood pressure is staying elevated - start metoprolol XL 25mg  daily in addition to enalapril. Return in 1 month for BP follow up visit.   Follow up plan: Return in about 4 weeks (around 05/24/2020) for follow up visit.  Ria Bush, MD

## 2020-04-26 NOTE — Assessment & Plan Note (Signed)
Increase vit D to 2000 IU daily.  

## 2020-04-26 NOTE — Patient Instructions (Addendum)
Let us know dates of Pfizer vaccines.  If interested, check with pharmacy about new 2 shot shingles series (shingrix).  Work on living will. Bring [place] copy when complete.  Work on regular non-weight bearing aerobic exercise.  Blood pressure is staying elevated - start metoprolol XL 25mg  daily in addition to enalapril. Return in 1 month for BP follow up visit.   Health Maintenance After Age 71 After age 39, you are at a higher risk for certain long-term diseases and infections as well as injuries from falls. Falls are a major cause of broken bones and head injuries in people who are older than age 22. Getting regular preventive care can help to keep you healthy and well. Preventive care includes getting regular testing and making lifestyle changes as recommended by your health care provider. Talk with your health care provider about:  Which screenings and tests you should have. A screening is a test that checks for a disease when you have no symptoms.  A diet and exercise plan that is right for you. What should I know about screenings and tests to prevent falls? Screening and testing are the best ways to find a health problem early. Early diagnosis and treatment give you the best chance of managing medical conditions that are common after age 105. Certain conditions and lifestyle choices may make you more likely to have a fall. Your health care provider may recommend:  Regular vision checks. Poor vision and conditions such as cataracts can make you more likely to have a fall. If you wear glasses, make sure to get your prescription updated if your vision changes.  Medicine review. Work with your health care provider to regularly review all of the medicines you are taking, including over-the-counter medicines. Ask your health care provider about any side effects that may make you more likely to have a fall. Tell your health care provider if any medicines that you take make you feel dizzy or  sleepy.  Osteoporosis screening. Osteoporosis is a condition that causes the bones to get weaker. This can make the bones weak and cause them to break more easily.  Blood pressure screening. Blood pressure changes and medicines to control blood pressure can make you feel dizzy.  Strength and balance checks. Your health care provider may recommend certain tests to check your strength and balance while standing, walking, or changing positions.  Foot health exam. Foot pain and numbness, as well as not wearing proper footwear, can make you more likely to have a fall.  Depression screening. You may be more likely to have a fall if you have a fear of falling, feel emotionally low, or feel unable to do activities that you used to do.  Alcohol use screening. Using too much alcohol can affect your balance and may make you more likely to have a fall. What actions can I take to lower my risk of falls? General instructions  Talk with your health care provider about your risks for falling. Tell your health care provider if: ? You fall. Be sure to tell your health care provider about all falls, even ones that seem minor. ? You feel dizzy, sleepy, or off-balance.  Take over-the-counter and prescription medicines only as told by your health care provider. These include any supplements.  Eat a healthy diet and maintain a healthy weight. A healthy diet includes low-fat dairy products, low-fat (lean) meats, and fiber from whole grains, beans, and lots of fruits and vegetables. Home safety  Remove any tripping hazards,  such as rugs, cords, and clutter.  Install safety equipment such as grab bars in bathrooms and safety rails on stairs.  Keep rooms and walkways well-lit. Activity   Follow a regular exercise program to stay fit. This will help you maintain your balance. Ask your health care provider what types of exercise are appropriate for you.  If you need a cane or walker, use it as recommended by  your health care provider.  Wear supportive shoes that have nonskid soles. Lifestyle  Do not drink alcohol if your health care provider tells you not to drink.  If you drink alcohol, limit how much you have: ? 0-1 drink a day for women. ? 0-2 drinks a day for men.  Be aware of how much alcohol is in your drink. In the [place] one drink equals one typical bottle of beer (12 oz), one-half glass of wine (5 oz), or one shot of hard liquor (1 oz).  Do not use any products that contain nicotine or tobacco, such as cigarettes and e-cigarettes. If you need help quitting, ask your health care provider. Summary  Having a healthy lifestyle and getting preventive care can help to protect your health and wellness after age 26.  Screening and testing are the best way to find a health problem early and help you avoid having a fall. Early diagnosis and treatment give you the best chance for managing medical conditions that are more common for people who are older than age 54.  Falls are a major cause of broken bones and head injuries in people who are older than age 35. Take precautions to prevent a fall at home.  Work with your health care provider to learn what changes you can make to improve your health and wellness and to prevent falls. This information is not intended to replace advice given to you by your health care provider. Make sure you discuss any questions you have with your health care provider. Document Revised: 09/17/2018 Document Reviewed: 04/09/2017 Elsevier Patient Education  2020 [employer].

## 2020-04-26 NOTE — Assessment & Plan Note (Signed)
Chronic, stable on simvastatin. Continue  The ASCVD Risk score Mikey Bussing DC Jr., et al., 2013) failed to calculate for the following reasons:   The valid total cholesterol range is 130 to 320 mg/dL

## 2020-04-26 NOTE — Assessment & Plan Note (Signed)
Weight gain discussed. Encouraged renewed efforts at non-weight bearing exercise (in h/o severe knee OA)

## 2020-04-26 NOTE — Assessment & Plan Note (Signed)
Continues enalapril 20mg  daily, discussed increasing blood pressures and effect on kidney function.

## 2020-04-26 NOTE — Assessment & Plan Note (Signed)
Advanced directives - does not have set up. Would want wife to be HCPOA. Packet previously provided. Full code. Wouldn't want prolonged life support if terminal condition.

## 2020-04-26 NOTE — Assessment & Plan Note (Signed)
Preventative protocols reviewed and updated unless pt declined. Discussed healthy diet and lifestyle.  

## 2020-04-26 NOTE — Assessment & Plan Note (Addendum)
Chronic, stable on metformin 1000mg  bid - continue. A1c adequate. Encouraged limiting night time ice cream.

## 2020-04-26 NOTE — Assessment & Plan Note (Signed)
Appreciate SM care. He is trying to avoid surgery.

## 2020-05-22 ENCOUNTER — Ambulatory Visit: Payer: PRIVATE HEALTH INSURANCE | Admitting: Family Medicine

## 2020-05-22 ENCOUNTER — Encounter: Payer: Self-pay | Admitting: Family Medicine

## 2020-05-22 ENCOUNTER — Other Ambulatory Visit: Payer: Self-pay

## 2020-05-22 DIAGNOSIS — I10 Essential (primary) hypertension: Secondary | ICD-10-CM | POA: Diagnosis not present

## 2020-05-22 MED ORDER — METOPROLOL SUCCINATE ER 50 MG PO TB24: 50.0000 mg | ORAL_TABLET | Freq: Every day | ORAL | 1 refills | Status: DC

## 2020-05-22 NOTE — Patient Instructions (Signed)
Blood pressures are doing better.  Bump up metoprolol dose to 50mg  daily.  Continue watching blood pressures at home.  Good to see you today, update [place] with effect.

## 2020-05-22 NOTE — Progress Notes (Signed)
Patient ID: James Robbins, male    DOB: Apr 09, 1951, 69 y.o.   MRN: [id number]  This visit was conducted in person.  BP 138/64 (BP Location: Left Arm, Patient Position: Sitting, Cuff Size: Large)   Pulse 99   Temp 97.7 F (36.5 C) (Temporal)   Ht 5\' 7"  (1.702 m)   Wt 242 lb 7 oz (110 kg)   SpO2 95%   BMI 37.97 kg/m   BP Readings from Last 3 Encounters:  05/22/20 138/64  04/26/20 (!) 150/94  11/10/19 (!) 160/90    Pulse Readings from Last 3 Encounters:  05/22/20 99  04/26/20 94  11/10/19 93   CC: HTN f/u visit  Subjective:   HPI: James Robbins is a 69 y.o. male presenting on 05/22/2020 for Hypertension (Here for 4 wk f/u. States recent BP readings:  12/1- 170/95, 12/10- 159/82.)   HTN - Compliant with current antihypertensive regimen of enalapril 20mg  daily, toprol XL 25mg  daily. Does not check blood pressures at home but recently bought BP cuff to use at home. No low blood pressure readings or symptoms of dizziness/syncope.  Denies HA, vision changes, CP/tightness, SOB, leg swelling.    Eating healthier, lost 5-6 lbs. Smaller portions, small breakfast and dinner. Trying to avoid salty and fried foods.   Has membership at [facility] - but hesitant to use this due to pandemic.      Relevant past medical, surgical, family and social history reviewed and updated as indicated. Interim medical history since our last visit reviewed. Allergies and medications reviewed and updated. Outpatient Medications Prior to Visit  Medication Sig Dispense Refill  . acetaminophen (TYLENOL) 650 MG CR tablet Take 650 mg by mouth every 8 (eight) hours as needed for pain. Takes 2 tablets every 8 hrs    . aspirin 81 MG tablet Take 81 mg by mouth daily.    . cetirizine (ZYRTEC) 10 MG tablet Take 10 mg by mouth daily.    . Cholecalciferol (VITAMIN D3) 1000 units CAPS Take 1 capsule (1,000 Units total) by mouth daily. 30 capsule   . diclofenac Sodium (VOLTAREN) 1 % GEL Apply topically as needed.    .  enalapril (VASOTEC) 20 MG tablet Take 1 tablet (20 mg total) by mouth daily. 90 tablet 3  . metFORMIN (GLUCOPHAGE) 1000 MG tablet Take 1 tablet (1,000 mg total) by mouth 2 (two) times daily with a meal. 180 tablet 3  . simvastatin (ZOCOR) 40 MG tablet Take 1 tablet (40 mg total) by mouth at bedtime. 90 tablet 3  . metoprolol succinate (TOPROL-XL) 25 MG 24 hr tablet Take 1 tablet (25 mg total) by mouth daily. 30 tablet 3   No facility-administered medications prior to visit.     Per HPI unless specifically indicated in ROS section below Review of Systems Objective:  BP 138/64 (BP Location: Left Arm, Patient Position: Sitting, Cuff Size: Large)   Pulse 99   Temp 97.7 F (36.5 C) (Temporal)   Ht 5\' 7"  (1.702 m)   Wt 242 lb 7 oz (110 kg)   SpO2 95%   BMI 37.97 kg/m   Wt Readings from Last 3 Encounters:  05/22/20 242 lb 7 oz (110 kg)  04/26/20 247 lb 9 oz (112.3 kg)  11/10/19 244 lb 2 oz (110.7 kg)      Physical Exam Vitals and nursing note reviewed.  Constitutional:      Appearance: Normal appearance. He is not ill-appearing.  Cardiovascular:     Rate and  Rhythm: Normal rate and regular rhythm.     Pulses: Normal pulses.     Heart sounds: Normal heart sounds. No murmur heard.   Musculoskeletal:        General: Normal range of motion.     Right lower leg: No edema.     Left lower leg: No edema.  Neurological:     Mental Status: He is alert.  Psychiatric:        Mood and Affect: Mood normal.        Behavior: Behavior normal.       Results for orders placed or performed in visit on 04/24/20  VITAMIN D 25 Hydroxy (Vit-D Deficiency, Fractures)  Result Value Ref Range   VITD 25.89 (L) 30.00 - 100.00 ng/mL  PSA  Result Value Ref Range   PSA 0.79 0.10 - 4.00 ng/mL  Hemoglobin A1c  Result Value Ref Range   Hgb A1c MFr Bld 7.1 (H) 4.6 - 6.5 %  Comprehensive metabolic panel  Result Value Ref Range   Sodium 142 135 - 145 mEq/L   Potassium 4.3 3.5 - 5.1 mEq/L   Chloride  103 96 - 112 mEq/L   CO2 32 19 - 32 mEq/L   Glucose, Bld 140 (H) 70 - 99 mg/dL   BUN 16 6 - 23 mg/dL   Creatinine, Ser 1.09 0.40 - 1.50 mg/dL   Total Bilirubin 0.5 0.2 - 1.2 mg/dL   Alkaline Phosphatase 50 39 - 117 U/L   AST 14 0 - 37 U/L   ALT 23 0 - 53 U/L   Total Protein 6.2 6.0 - 8.3 g/dL   Albumin 4.1 3.5 - 5.2 g/dL   GFR 68.39 >60.00 mL/min   Calcium 9.5 8.4 - 10.5 mg/dL  Lipid panel  Result Value Ref Range   Cholesterol 124 0 - 200 mg/dL   Triglycerides 110.0 0.0 - 149.0 mg/dL   HDL 41.20 >39.00 mg/dL   VLDL 22.0 0.0 - 40.0 mg/dL   LDL Cholesterol 61 0 - 99 mg/dL   Total CHOL/HDL Ratio 3    NonHDL 82.52    Assessment & Plan:  This visit occurred during the SARS-CoV-2 public health emergency.  Safety protocols were in place, including screening questions prior to the visit, additional usage of staff PPE, and extensive cleaning of exam room while observing appropriate contact time as indicated for disinfecting solutions.   Problem List Items Addressed This Visit    Hypertension    Improving readings however remaining elevated on recent checks at other medical centers. Will increase toprol XL to 50mg  daily, continue enalapril 20mg  daily. Reviewed monitoring for hypotensive symptoms.  Update with effect. Follow back in office in 1-2 mo if BP remaining uncontrolled.       Relevant Medications   metoprolol succinate (TOPROL-XL) 50 MG 24 hr tablet       Meds ordered this encounter  Medications  . metoprolol succinate (TOPROL-XL) 50 MG 24 hr tablet    Sig: Take 1 tablet (50 mg total) by mouth daily.    Dispense:  90 tablet    Refill:  1   No orders of the defined types were placed in this encounter.  Patient Instructions  Blood pressures are doing better.  Bump up metoprolol dose to 50mg  daily.  Continue watching blood pressures at home.  Good to see you today, update [place] with effect.   Follow up plan: Return if symptoms worsen or fail to improve.  Ria Bush, MD

## 2020-05-22 NOTE — Assessment & Plan Note (Signed)
Improving readings however remaining elevated on recent checks at other medical centers. Will increase toprol XL to 50mg  daily, continue enalapril 20mg  daily. Reviewed monitoring for hypotensive symptoms.  Update with effect. Follow back in office in 1-2 mo if BP remaining uncontrolled.

## 2020-06-04 ENCOUNTER — Other Ambulatory Visit: Payer: Self-pay | Admitting: Family Medicine

## 2020-06-07 ENCOUNTER — Other Ambulatory Visit: Payer: Self-pay | Admitting: Family Medicine

## 2020-10-27 ENCOUNTER — Emergency Department (HOSPITAL_COMMUNITY): Payer: No Typology Code available for payment source

## 2020-10-27 ENCOUNTER — Emergency Department (HOSPITAL_COMMUNITY)
Admission: EM | Admit: 2020-10-27 | Discharge: 2020-10-28 | Disposition: A | Discharge status: Home / Self Care | Payer: No Typology Code available for payment source | Attending: Emergency Medicine | Admitting: Emergency Medicine

## 2020-10-27 ENCOUNTER — Ambulatory Visit (INDEPENDENT_AMBULATORY_CARE_PROVIDER_SITE_OTHER)
Admission: EM | Admit: 2020-10-27 | Discharge: 2020-10-27 | Disposition: A | Discharge status: Home / Self Care | Payer: No Typology Code available for payment source | Source: Home / Self Care

## 2020-10-27 ENCOUNTER — Encounter (HOSPITAL_COMMUNITY): Payer: Self-pay

## 2020-10-27 ENCOUNTER — Other Ambulatory Visit: Payer: Self-pay

## 2020-10-27 DIAGNOSIS — I129 Hypertensive chronic kidney disease with stage 1 through stage 4 chronic kidney disease, or unspecified chronic kidney disease: Secondary | ICD-10-CM | POA: Diagnosis not present

## 2020-10-27 DIAGNOSIS — N182 Chronic kidney disease, stage 2 (mild): Secondary | ICD-10-CM | POA: Insufficient documentation

## 2020-10-27 DIAGNOSIS — R079 Chest pain, unspecified: Secondary | ICD-10-CM | POA: Insufficient documentation

## 2020-10-27 DIAGNOSIS — R109 Unspecified abdominal pain: Secondary | ICD-10-CM | POA: Diagnosis not present

## 2020-10-27 DIAGNOSIS — R0789 Other chest pain: Secondary | ICD-10-CM | POA: Insufficient documentation

## 2020-10-27 DIAGNOSIS — Z7984 Long term (current) use of oral hypoglycemic drugs: Secondary | ICD-10-CM | POA: Insufficient documentation

## 2020-10-27 DIAGNOSIS — Z87891 Personal history of nicotine dependence: Secondary | ICD-10-CM | POA: Insufficient documentation

## 2020-10-27 DIAGNOSIS — I4891 Unspecified atrial fibrillation: Secondary | ICD-10-CM | POA: Insufficient documentation

## 2020-10-27 DIAGNOSIS — S299XXA Unspecified injury of thorax, initial encounter: Secondary | ICD-10-CM | POA: Insufficient documentation

## 2020-10-27 DIAGNOSIS — E1122 Type 2 diabetes mellitus with diabetic chronic kidney disease: Secondary | ICD-10-CM | POA: Insufficient documentation

## 2020-10-27 DIAGNOSIS — Z7982 Long term (current) use of aspirin: Secondary | ICD-10-CM | POA: Diagnosis not present

## 2020-10-27 DIAGNOSIS — Y92410 Unspecified street and highway as the place of occurrence of the external cause: Secondary | ICD-10-CM | POA: Insufficient documentation

## 2020-10-27 DIAGNOSIS — Z79899 Other long term (current) drug therapy: Secondary | ICD-10-CM | POA: Diagnosis not present

## 2020-10-27 DIAGNOSIS — D72829 Elevated white blood cell count, unspecified: Secondary | ICD-10-CM | POA: Insufficient documentation

## 2020-10-27 DIAGNOSIS — T1490XA Injury, unspecified, initial encounter: Secondary | ICD-10-CM

## 2020-10-27 DIAGNOSIS — M25532 Pain in left wrist: Secondary | ICD-10-CM | POA: Diagnosis not present

## 2020-10-27 LAB — BASIC METABOLIC PANEL
Anion gap: 7 (ref 5–15)
BUN: 19 mg/dL (ref 8–23)
CO2: 26 mmol/L (ref 22–32)
Calcium: 9.9 mg/dL (ref 8.9–10.3)
Chloride: 108 mmol/L (ref 98–111)
Creatinine, Ser: 1.08 mg/dL (ref 0.61–1.24)
GFR, Estimated: >60 mL/min (ref >60)
Glucose, Bld: 114 mg/dL — ABNORMAL HIGH (ref 70–99)
Potassium: 3.8 mmol/L (ref 3.5–5.1)
Sodium: 141 mmol/L (ref 135–145)

## 2020-10-27 LAB — CBC
HCT: 43.8 % (ref 39.0–52.0)
Hemoglobin: 14.3 g/dL (ref 13.0–17.0)
MCH: 29.9 pg (ref 26.0–34.0)
MCHC: 32.6 g/dL (ref 30.0–36.0)
MCV: 91.6 fL (ref 80.0–100.0)
Platelets: 276 10*3/uL (ref 150–400)
RBC: 4.78 MIL/uL (ref 4.22–5.81)
RDW: 13.2 % (ref 11.5–15.5)
WBC: 12.9 10*3/uL — ABNORMAL HIGH (ref 4.0–10.5)
nRBC: 0 % (ref 0.0–0.2)

## 2020-10-27 LAB — TROPONIN I (HIGH SENSITIVITY): Troponin I (High Sensitivity): 10 ng/L (ref <18)
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
## 2020-10-28 ENCOUNTER — Emergency Department (HOSPITAL_COMMUNITY): Payer: No Typology Code available for payment source

## 2020-10-28 ENCOUNTER — Other Ambulatory Visit: Payer: Self-pay

## 2020-10-28 ENCOUNTER — Encounter (HOSPITAL_COMMUNITY): Payer: Self-pay | Admitting: Emergency Medicine

## 2020-10-28 DIAGNOSIS — R0789 Other chest pain: Secondary | ICD-10-CM | POA: Diagnosis not present

## 2020-10-28 LAB — URINALYSIS, ROUTINE W REFLEX MICROSCOPIC
Bacteria, UA: NONE SEEN
Bilirubin Urine: NEGATIVE
Glucose, UA: 50 mg/dL — AB
Ketones, ur: NEGATIVE mg/dL
Leukocytes,Ua: NEGATIVE
Nitrite: NEGATIVE
Protein, ur: 100 mg/dL — AB
Specific Gravity, Urine: 1.024 (ref 1.005–1.030)
pH: 5 (ref 5.0–8.0)

## 2020-10-28 LAB — TROPONIN I (HIGH SENSITIVITY): Troponin I (High Sensitivity): 11 ng/L (ref <18)

## 2020-10-28 MED ORDER — LIDOCAINE 5 % EX PTCH: 1.0000 | MEDICATED_PATCH | CUTANEOUS | 0 refills | Status: DC

## 2020-10-28 MED ORDER — IOHEXOL 300 MG/ML  SOLN
75.0000 mL | Freq: Once | INTRAMUSCULAR | Status: AC | PRN
  Administered 2020-10-28: 75 mL via INTRAVENOUS

## 2020-10-28 MED ORDER — ENALAPRIL MALEATE 5 MG PO TABS
20.0000 mg | ORAL_TABLET | Freq: Once | ORAL | Status: AC
  Administered 2020-10-28: 20 mg via ORAL
  Filled 2020-10-28: qty 4

## 2020-10-28 MED ORDER — APIXABAN 5 MG PO TABS
5.0000 mg | ORAL_TABLET | Freq: Once | ORAL | Status: AC
  Administered 2020-10-28: 5 mg via ORAL

## 2020-10-28 MED ORDER — APIXABAN 5 MG PO TABS: 5.0000 mg | ORAL_TABLET | Freq: Two times a day (BID) | ORAL | 0 refills | Status: DC

## 2020-10-28 MED ORDER — ENALAPRIL MALEATE 20 MG PO TABS
20.0000 mg | ORAL_TABLET | Freq: Once | ORAL | Status: DC
  Filled 2020-10-28 (×2): qty 1

## 2020-10-28 MED ORDER — METOPROLOL SUCCINATE ER 25 MG PO TB24
50.0000 mg | ORAL_TABLET | Freq: Once | ORAL | Status: AC
  Administered 2020-10-28: 50 mg via ORAL
  Filled 2020-10-28: qty 2
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
## 2020-10-28 NOTE — ED Notes (Signed)
Bladder scan patient it showed 252ml patient is resting with family at bedside and call bell in reach
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
## 2020-11-03 ENCOUNTER — Encounter (HOSPITAL_COMMUNITY): Payer: Self-pay | Admitting: Nurse Practitioner

## 2020-11-03 ENCOUNTER — Other Ambulatory Visit: Payer: Self-pay

## 2020-11-03 ENCOUNTER — Ambulatory Visit (HOSPITAL_COMMUNITY)
Admission: RE | Admit: 2020-11-03 | Discharge: 2020-11-03 | Disposition: A | Discharge status: Home / Self Care | Payer: PRIVATE HEALTH INSURANCE | Source: Ambulatory Visit | Attending: Nurse Practitioner | Admitting: Nurse Practitioner

## 2020-11-03 VITALS — BP 150/92 | HR 103 | Ht 67.0 in | Wt 232.4 lb

## 2020-11-03 DIAGNOSIS — Z7982 Long term (current) use of aspirin: Secondary | ICD-10-CM | POA: Insufficient documentation

## 2020-11-03 DIAGNOSIS — R0683 Snoring: Secondary | ICD-10-CM | POA: Diagnosis not present

## 2020-11-03 DIAGNOSIS — R0681 Apnea, not elsewhere classified: Secondary | ICD-10-CM | POA: Diagnosis not present

## 2020-11-03 DIAGNOSIS — Z7984 Long term (current) use of oral hypoglycemic drugs: Secondary | ICD-10-CM | POA: Insufficient documentation

## 2020-11-03 DIAGNOSIS — Z7901 Long term (current) use of anticoagulants: Secondary | ICD-10-CM | POA: Insufficient documentation

## 2020-11-03 DIAGNOSIS — Z87891 Personal history of nicotine dependence: Secondary | ICD-10-CM | POA: Diagnosis not present

## 2020-11-03 DIAGNOSIS — I120 Hypertensive chronic kidney disease with stage 5 chronic kidney disease or end stage renal disease: Secondary | ICD-10-CM | POA: Diagnosis not present

## 2020-11-03 DIAGNOSIS — E669 Obesity, unspecified: Secondary | ICD-10-CM | POA: Diagnosis not present

## 2020-11-03 DIAGNOSIS — Z6836 Body mass index (BMI) 36.0-36.9, adult: Secondary | ICD-10-CM | POA: Diagnosis not present

## 2020-11-03 DIAGNOSIS — I4891 Unspecified atrial fibrillation: Secondary | ICD-10-CM | POA: Insufficient documentation

## 2020-11-03 DIAGNOSIS — Z79899 Other long term (current) drug therapy: Secondary | ICD-10-CM | POA: Diagnosis not present

## 2020-11-03 DIAGNOSIS — N189 Chronic kidney disease, unspecified: Secondary | ICD-10-CM | POA: Insufficient documentation

## 2020-11-03 DIAGNOSIS — Z713 Dietary counseling and surveillance: Secondary | ICD-10-CM | POA: Diagnosis not present

## 2020-11-03 DIAGNOSIS — I480 Paroxysmal atrial fibrillation: Secondary | ICD-10-CM

## 2020-11-03 DIAGNOSIS — E1122 Type 2 diabetes mellitus with diabetic chronic kidney disease: Secondary | ICD-10-CM | POA: Insufficient documentation
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
## 2020-11-07 ENCOUNTER — Encounter: Payer: Self-pay | Admitting: Family Medicine

## 2020-11-07 ENCOUNTER — Ambulatory Visit: Payer: PRIVATE HEALTH INSURANCE | Admitting: Family Medicine

## 2020-11-07 ENCOUNTER — Other Ambulatory Visit: Payer: Self-pay

## 2020-11-07 VITALS — BP 136/76 | HR 100 | Temp 97.9°F | Ht 67.0 in | Wt 231.1 lb

## 2020-11-07 DIAGNOSIS — I480 Paroxysmal atrial fibrillation: Secondary | ICD-10-CM | POA: Insufficient documentation

## 2020-11-07 DIAGNOSIS — G4733 Obstructive sleep apnea (adult) (pediatric): Secondary | ICD-10-CM | POA: Diagnosis not present

## 2020-11-07 DIAGNOSIS — E1121 Type 2 diabetes mellitus with diabetic nephropathy: Secondary | ICD-10-CM | POA: Diagnosis not present

## 2020-11-07 DIAGNOSIS — I4821 Permanent atrial fibrillation: Secondary | ICD-10-CM | POA: Insufficient documentation

## 2020-11-07 DIAGNOSIS — I10 Essential (primary) hypertension: Secondary | ICD-10-CM

## 2020-11-07 LAB — POCT GLYCOSYLATED HEMOGLOBIN (HGB A1C): Hemoglobin A1C: 6.5 % — AB (ref 4.0–5.6)
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
## 2020-11-07 NOTE — Assessment & Plan Note (Addendum)
Presumed dx by prior doctor (2000), previously did not tolerate full face mask.  Never had sleep study. Agrees to pulm referral for further assessment especially in setting of new afib dx.  ESS = 8
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
## 2020-11-07 NOTE — Assessment & Plan Note (Signed)
Congratulated on weight loss to date - he is working on [employer].

## 2020-11-07 NOTE — Assessment & Plan Note (Signed)
Chronic, stable on current regimen of ACEI and Toprol XL

## 2020-11-07 NOTE — Patient Instructions (Addendum)
We will refer you to sleep doctor.  Continue current medicines.  Good to see you today.  Return in 6 months for physical.  Congratulations on healthy diet changes and weight loss to date!

## 2020-11-07 NOTE — Assessment & Plan Note (Signed)
Update A1c.  Continue full dose metformin 1000mg  bid.  RTC 3 mo DM f/u visit.
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
## 2020-11-15 ENCOUNTER — Ambulatory Visit (HOSPITAL_COMMUNITY)
Admission: RE | Admit: 2020-11-15 | Discharge: 2020-11-15 | Disposition: A | Discharge status: Home / Self Care | Payer: PRIVATE HEALTH INSURANCE | Source: Ambulatory Visit | Attending: Nurse Practitioner | Admitting: Nurse Practitioner

## 2020-11-15 ENCOUNTER — Encounter (HOSPITAL_COMMUNITY): Payer: Self-pay | Admitting: Nurse Practitioner

## 2020-11-15 ENCOUNTER — Ambulatory Visit (HOSPITAL_COMMUNITY): Payer: PRIVATE HEALTH INSURANCE | Admitting: Nurse Practitioner

## 2020-11-15 ENCOUNTER — Other Ambulatory Visit: Payer: Self-pay

## 2020-11-15 VITALS — BP 128/74 | HR 86 | Ht 67.0 in | Wt 227.4 lb

## 2020-11-15 DIAGNOSIS — E1122 Type 2 diabetes mellitus with diabetic chronic kidney disease: Secondary | ICD-10-CM | POA: Diagnosis not present

## 2020-11-15 DIAGNOSIS — I4819 Other persistent atrial fibrillation: Secondary | ICD-10-CM

## 2020-11-15 DIAGNOSIS — Z79899 Other long term (current) drug therapy: Secondary | ICD-10-CM | POA: Diagnosis not present

## 2020-11-15 DIAGNOSIS — N181 Chronic kidney disease, stage 1: Secondary | ICD-10-CM | POA: Diagnosis not present

## 2020-11-15 DIAGNOSIS — I129 Hypertensive chronic kidney disease with stage 1 through stage 4 chronic kidney disease, or unspecified chronic kidney disease: Secondary | ICD-10-CM | POA: Diagnosis not present

## 2020-11-15 DIAGNOSIS — Z8249 Family history of ischemic heart disease and other diseases of the circulatory system: Secondary | ICD-10-CM | POA: Diagnosis not present

## 2020-11-15 DIAGNOSIS — D6869 Other thrombophilia: Secondary | ICD-10-CM | POA: Diagnosis not present

## 2020-11-15 DIAGNOSIS — Z7984 Long term (current) use of oral hypoglycemic drugs: Secondary | ICD-10-CM | POA: Insufficient documentation

## 2020-11-15 DIAGNOSIS — Z7901 Long term (current) use of anticoagulants: Secondary | ICD-10-CM | POA: Diagnosis not present

## 2020-11-15 DIAGNOSIS — Z87891 Personal history of nicotine dependence: Secondary | ICD-10-CM | POA: Diagnosis not present

## 2020-11-15 DIAGNOSIS — E785 Hyperlipidemia, unspecified: Secondary | ICD-10-CM | POA: Diagnosis not present

## 2020-11-15 DIAGNOSIS — Z6841 Body Mass Index (BMI) 40.0 and over, adult: Secondary | ICD-10-CM | POA: Diagnosis not present

## 2020-11-15 DIAGNOSIS — I4891 Unspecified atrial fibrillation: Secondary | ICD-10-CM | POA: Insufficient documentation

## 2020-11-15 LAB — BASIC METABOLIC PANEL
Anion gap: 9 (ref 5–15)
BUN: 24 mg/dL — ABNORMAL HIGH (ref 8–23)
CO2: 28 mmol/L (ref 22–32)
Calcium: 10.1 mg/dL (ref 8.9–10.3)
Chloride: 103 mmol/L (ref 98–111)
Creatinine, Ser: 1.02 mg/dL (ref 0.61–1.24)
GFR, Estimated: >60 mL/min (ref >60)
Glucose, Bld: 93 mg/dL (ref 70–99)
Potassium: 4.7 mmol/L (ref 3.5–5.1)
Sodium: 140 mmol/L (ref 135–145)

## 2020-11-15 LAB — CBC
HCT: 43.2 % (ref 39.0–52.0)
Hemoglobin: 13.8 g/dL (ref 13.0–17.0)
MCH: 29.5 pg (ref 26.0–34.0)
MCHC: 31.9 g/dL (ref 30.0–36.0)
MCV: 92.3 fL (ref 80.0–100.0)
Platelets: 264 10*3/uL (ref 150–400)
RBC: 4.68 MIL/uL (ref 4.22–5.81)
RDW: 13.1 % (ref 11.5–15.5)
WBC: 9 10*3/uL (ref 4.0–10.5)
nRBC: 0 % (ref 0.0–0.2)

## 2020-11-15 MED ORDER — APIXABAN 5 MG PO TABS
5.0000 mg | ORAL_TABLET | Freq: Two times a day (BID) | ORAL | 3 refills | Status: DC
Start: 2020-11-15 — End: 2021-04-02

## 2020-11-15 NOTE — Patient Instructions (Addendum)
Cardioversion scheduled for Monday, June 20th  - Arrive at the [facility] and go to admitting at 830AM  - Do not eat or drink anything after midnight the night prior to your procedure.  - Take all your morning medication (except diabetic medications) with a sip of water prior to arrival.  - You will not be able to drive home after your procedure.  - Do NOT miss any doses of your blood thinner - if you should miss a dose please notify our office immediately.  - If you feel as if you go back into normal rhythm prior to scheduled cardioversion, please notify our office immediately. If your procedure is canceled in the cardioversion suite you will be charged a cancellation fee.  Patients will be asked to: to mask in public and hand hygiene (no longer quarantine) in the 3 days prior to surgery, to report if any COVID-19-like illness or household contacts to COVID-19 to determine need for testing
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
## 2020-11-27 ENCOUNTER — Ambulatory Visit (HOSPITAL_COMMUNITY): Payer: No Typology Code available for payment source | Admitting: Certified Registered Nurse Anesthetist

## 2020-11-27 ENCOUNTER — Other Ambulatory Visit: Payer: Self-pay

## 2020-11-27 ENCOUNTER — Other Ambulatory Visit: Payer: Self-pay | Admitting: Family Medicine

## 2020-11-27 ENCOUNTER — Encounter (HOSPITAL_COMMUNITY): Payer: Self-pay | Admitting: Cardiovascular Disease

## 2020-11-27 ENCOUNTER — Ambulatory Visit (HOSPITAL_COMMUNITY)
Admission: RE | Admit: 2020-11-27 | Discharge: 2020-11-27 | Disposition: A | Discharge status: Home / Self Care | Payer: No Typology Code available for payment source | Attending: Cardiovascular Disease | Admitting: Cardiovascular Disease

## 2020-11-27 ENCOUNTER — Encounter (HOSPITAL_COMMUNITY)
Admission: RE | Disposition: A | Discharge status: Home / Self Care | Payer: Self-pay | Source: Home / Self Care | Attending: Cardiovascular Disease

## 2020-11-27 DIAGNOSIS — I129 Hypertensive chronic kidney disease with stage 1 through stage 4 chronic kidney disease, or unspecified chronic kidney disease: Secondary | ICD-10-CM | POA: Diagnosis not present

## 2020-11-27 DIAGNOSIS — Z7901 Long term (current) use of anticoagulants: Secondary | ICD-10-CM | POA: Diagnosis not present

## 2020-11-27 DIAGNOSIS — I491 Atrial premature depolarization: Secondary | ICD-10-CM | POA: Insufficient documentation

## 2020-11-27 DIAGNOSIS — N189 Chronic kidney disease, unspecified: Secondary | ICD-10-CM | POA: Insufficient documentation

## 2020-11-27 DIAGNOSIS — E1122 Type 2 diabetes mellitus with diabetic chronic kidney disease: Secondary | ICD-10-CM | POA: Diagnosis not present

## 2020-11-27 DIAGNOSIS — Z79899 Other long term (current) drug therapy: Secondary | ICD-10-CM | POA: Insufficient documentation

## 2020-11-27 DIAGNOSIS — Z833 Family history of diabetes mellitus: Secondary | ICD-10-CM | POA: Insufficient documentation

## 2020-11-27 DIAGNOSIS — I4891 Unspecified atrial fibrillation: Secondary | ICD-10-CM | POA: Insufficient documentation

## 2020-11-27 DIAGNOSIS — Z87891 Personal history of nicotine dependence: Secondary | ICD-10-CM | POA: Insufficient documentation

## 2020-11-27 DIAGNOSIS — Z7984 Long term (current) use of oral hypoglycemic drugs: Secondary | ICD-10-CM | POA: Insufficient documentation

## 2020-11-27 DIAGNOSIS — Z8249 Family history of ischemic heart disease and other diseases of the circulatory system: Secondary | ICD-10-CM | POA: Insufficient documentation

## 2020-11-27 DIAGNOSIS — Z8262 Family history of osteoporosis: Secondary | ICD-10-CM | POA: Insufficient documentation

## 2020-11-27 DIAGNOSIS — I4819 Other persistent atrial fibrillation: Secondary | ICD-10-CM

## 2020-11-27 HISTORY — DX: Sleep apnea, unspecified: G47.30

## 2020-11-27 HISTORY — PX: CARDIOVERSION: SHX1299

## 2020-11-27 SURGERY — CARDIOVERSION
Anesthesia: General

## 2020-11-27 MED ORDER — SODIUM CHLORIDE 0.9 % IV SOLN: INTRAVENOUS | Status: DC

## 2020-11-27 MED ORDER — LIDOCAINE 2% (20 MG/ML) 5 ML SYRINGE
INTRAMUSCULAR | Status: DC | PRN
  Administered 2020-11-27: 100 mg via INTRAVENOUS

## 2020-11-27 MED ORDER — PROPOFOL 10 MG/ML IV BOLUS
INTRAVENOUS | Status: DC | PRN
  Administered 2020-11-27: 100 mg via INTRAVENOUS

## 2020-11-27 NOTE — Transfer of Care (Signed)
Immediate Anesthesia Transfer of Care Note  Patient: James Robbins  Procedure(s) Performed: CARDIOVERSION  Patient Location: Endoscopy Unit  Anesthesia Type:General  Level of Consciousness: awake and drowsy  Airway & Oxygen Therapy: Patient Spontanous Breathing  Post-op Assessment: Report given to RN, Post -op Vital signs reviewed and stable and Patient moving all extremities X 4  Post vital signs: Reviewed and stable  Last Vitals:  Vitals Value Taken Time  BP    Temp    Pulse    Resp    SpO2      Last Pain:  Vitals:   11/27/20 0905  TempSrc: Oral  PainSc: 0-No pain         Complications: No notable events documented.

## 2020-11-27 NOTE — CV Procedure (Signed)
    Cardioversion Note  RAIFE LIZER [id number] [birth year]  Procedure: DC Cardioversion Indications: atrial fib   Procedure Details Consent: Obtained Time Out: Verified patient identification, verified procedure, site/side was marked, verified correct patient position, special equipment/implants available, Radiology Safety Procedures followed,  medications/allergies/relevent history reviewed, required imaging and test results available.  Performed  The patient has been on adequate anticoagulation.  The patient received IV Lidocaine 100 mg IV followed by Propofol 100 mg IV  for sedation.  Synchronous cardioversion was performed at 200  joules.  The cardioversion was successful     Complications: No apparent complications Patient did tolerate procedure well.   Thayer Headings, Brooke Bonito., MD, [facility] 11/27/2020, 9:55 AM

## 2020-11-27 NOTE — Interval H&P Note (Signed)
History and Physical Interval Note:  11/27/2020 8:56 AM  James Robbins  has presented today for surgery, with the diagnosis of AFIB.  The various methods of treatment have been discussed with the patient and family. After consideration of risks, benefits and other options for treatment, the patient has consented to  Procedure(s): CARDIOVERSION (N/A) as a surgical intervention.  The patient's history has been reviewed, patient examined, no change in status, stable for surgery.  I have reviewed the patient's chart and labs.  Questions were answered to the patient's satisfaction.     Mertie Moores

## 2020-11-27 NOTE — Anesthesia Postprocedure Evaluation (Signed)
Anesthesia Post Note  Patient: [name]  Procedure(s) Performed: CARDIOVERSION     Patient location during evaluation: Endoscopy Anesthesia Type: General Level of consciousness: awake and alert Pain management: pain level controlled Vital Signs Assessment: post-procedure vital signs reviewed and stable Respiratory status: spontaneous breathing, nonlabored ventilation and respiratory function stable Cardiovascular status: blood pressure returned to baseline and stable Postop Assessment: no apparent nausea or vomiting Anesthetic complications: no   No notable events documented.  Last Vitals:  Vitals:   11/27/20 0943 11/27/20 0953  BP: (!) 153/80 (!) 143/91  Pulse: 62 61  Resp: (!) 26 17  Temp:    SpO2: 97% 97%    Last Pain:  Vitals:   11/27/20 0953  TempSrc:   PainSc: 0-No pain                 Lynda Rainwater

## 2020-11-27 NOTE — Anesthesia Preprocedure Evaluation (Signed)
Anesthesia Evaluation  Patient identified by MRN, date of birth, ID band Patient awake    Reviewed: Allergy & Precautions, NPO status , Patient's Chart, lab work & pertinent test results  Airway Mallampati: II  TM Distance: >3 FB Neck ROM: Full    Dental no notable dental hx.    Pulmonary sleep apnea , former smoker,    Pulmonary exam normal breath sounds clear to auscultation       Cardiovascular hypertension, Normal cardiovascular exam+ dysrhythmias  Rhythm:Regular Rate:Normal     Neuro/Psych negative neurological ROS  negative psych ROS   GI/Hepatic negative GI ROS, Neg liver ROS,   Endo/Other  negative endocrine ROSdiabetes  Renal/GU negative Renal ROS  negative genitourinary   Musculoskeletal negative musculoskeletal ROS (+)   Abdominal (+) + obese,   Peds negative pediatric ROS (+)  Hematology negative hematology ROS (+)   Anesthesia Other Findings   Reproductive/Obstetrics negative OB ROS                             Anesthesia Physical Anesthesia Plan  ASA: 3  Anesthesia Plan: General   Post-op Pain Management:    Induction: Intravenous  PONV Risk Score and Plan: 2 and Ondansetron, Midazolam and Treatment may vary due to age or medical condition  Airway Management Planned: Mask  Additional Equipment:   Intra-op Plan:   Post-operative Plan:   Informed Consent: I have reviewed the patients History and Physical, chart, labs and discussed the procedure including the risks, benefits and alternatives for the proposed anesthesia with the patient or authorized representative who has indicated his/her understanding and acceptance.     Dental advisory given  Plan Discussed with: CRNA  Anesthesia Plan Comments:         Anesthesia Quick Evaluation

## 2020-11-27 NOTE — Anesthesia Procedure Notes (Signed)
Procedure Name: MAC Date/Time: 11/27/2020 9:20 AM Performed by: Harden Mo, CRNA Pre-anesthesia Checklist: Patient identified, Emergency Drugs available, Suction available, Patient being monitored and Timeout performed Patient Re-evaluated:Patient Re-evaluated prior to induction Oxygen Delivery Method: Ambu bag Preoxygenation: Pre-oxygenation with 100% oxygen Induction Type: IV induction Ventilation: Mask ventilation without difficulty Placement Confirmation: positive ETCO2 and breath sounds checked- equal and bilateral Dental Injury: Teeth and Oropharynx as per pre-operative assessment

## 2020-11-29 ENCOUNTER — Encounter (HOSPITAL_COMMUNITY): Payer: Self-pay | Admitting: Cardiovascular Disease

## 2020-12-05 ENCOUNTER — Encounter (HOSPITAL_COMMUNITY): Payer: Self-pay | Admitting: Nurse Practitioner

## 2020-12-05 ENCOUNTER — Other Ambulatory Visit: Payer: Self-pay

## 2020-12-05 ENCOUNTER — Ambulatory Visit (HOSPITAL_COMMUNITY)
Admission: RE | Admit: 2020-12-05 | Discharge: 2020-12-05 | Disposition: A | Discharge status: Home / Self Care | Payer: No Typology Code available for payment source | Source: Ambulatory Visit | Attending: Nurse Practitioner | Admitting: Nurse Practitioner

## 2020-12-05 VITALS — BP 134/92 | HR 85 | Ht 67.0 in | Wt 228.4 lb

## 2020-12-05 DIAGNOSIS — Z7901 Long term (current) use of anticoagulants: Secondary | ICD-10-CM | POA: Diagnosis not present

## 2020-12-05 DIAGNOSIS — I4891 Unspecified atrial fibrillation: Secondary | ICD-10-CM | POA: Insufficient documentation

## 2020-12-05 DIAGNOSIS — Z833 Family history of diabetes mellitus: Secondary | ICD-10-CM | POA: Diagnosis not present

## 2020-12-05 DIAGNOSIS — Z79899 Other long term (current) drug therapy: Secondary | ICD-10-CM | POA: Diagnosis not present

## 2020-12-05 DIAGNOSIS — D6869 Other thrombophilia: Secondary | ICD-10-CM

## 2020-12-05 DIAGNOSIS — N181 Chronic kidney disease, stage 1: Secondary | ICD-10-CM | POA: Insufficient documentation

## 2020-12-05 DIAGNOSIS — Z7984 Long term (current) use of oral hypoglycemic drugs: Secondary | ICD-10-CM | POA: Insufficient documentation

## 2020-12-05 DIAGNOSIS — Z8249 Family history of ischemic heart disease and other diseases of the circulatory system: Secondary | ICD-10-CM | POA: Diagnosis not present

## 2020-12-05 DIAGNOSIS — Z87891 Personal history of nicotine dependence: Secondary | ICD-10-CM | POA: Diagnosis not present

## 2020-12-05 DIAGNOSIS — I129 Hypertensive chronic kidney disease with stage 1 through stage 4 chronic kidney disease, or unspecified chronic kidney disease: Secondary | ICD-10-CM | POA: Diagnosis not present

## 2020-12-05 DIAGNOSIS — E1122 Type 2 diabetes mellitus with diabetic chronic kidney disease: Secondary | ICD-10-CM | POA: Diagnosis not present

## 2020-12-05 DIAGNOSIS — I4819 Other persistent atrial fibrillation: Secondary | ICD-10-CM
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
## 2020-12-07 ENCOUNTER — Ambulatory Visit (HOSPITAL_COMMUNITY)
Admission: RE | Admit: 2020-12-07 | Discharge: 2020-12-07 | Disposition: A | Discharge status: Home / Self Care | Payer: No Typology Code available for payment source | Source: Ambulatory Visit | Attending: Nurse Practitioner | Admitting: Nurse Practitioner

## 2020-12-07 ENCOUNTER — Other Ambulatory Visit: Payer: Self-pay

## 2020-12-07 DIAGNOSIS — I4819 Other persistent atrial fibrillation: Secondary | ICD-10-CM | POA: Diagnosis not present

## 2020-12-07 DIAGNOSIS — I4891 Unspecified atrial fibrillation: Secondary | ICD-10-CM | POA: Diagnosis not present

## 2020-12-07 DIAGNOSIS — G4730 Sleep apnea, unspecified: Secondary | ICD-10-CM | POA: Diagnosis not present

## 2020-12-07 DIAGNOSIS — I119 Hypertensive heart disease without heart failure: Secondary | ICD-10-CM | POA: Diagnosis not present

## 2020-12-07 DIAGNOSIS — E119 Type 2 diabetes mellitus without complications: Secondary | ICD-10-CM | POA: Diagnosis not present

## 2020-12-07 DIAGNOSIS — I351 Nonrheumatic aortic (valve) insufficiency: Secondary | ICD-10-CM | POA: Insufficient documentation

## 2020-12-07 DIAGNOSIS — Z87891 Personal history of nicotine dependence: Secondary | ICD-10-CM | POA: Diagnosis not present

## 2020-12-07 LAB — ECHOCARDIOGRAM COMPLETE
Area-P 1/2: 3.84 cm2
Calc EF: 60.6 %
S' Lateral: 3.4 cm
Single Plane A2C EF: 63.4 %
Single Plane A4C EF: 59.2 %

## 2020-12-07 NOTE — Progress Notes (Signed)
  Echocardiogram 2D Echocardiogram has been performed.  Geoffery Lyons Swaim 12/07/2020, 2:03 PM

## 2020-12-21 ENCOUNTER — Encounter: Payer: Self-pay | Admitting: Family Medicine

## 2020-12-26 ENCOUNTER — Encounter: Payer: Self-pay | Admitting: Pulmonary Disease

## 2020-12-26 ENCOUNTER — Ambulatory Visit (INDEPENDENT_AMBULATORY_CARE_PROVIDER_SITE_OTHER): Payer: No Typology Code available for payment source | Admitting: Pulmonary Disease

## 2020-12-26 ENCOUNTER — Other Ambulatory Visit: Payer: Self-pay

## 2020-12-26 VITALS — BP 130/88 | HR 92 | Temp 97.7°F | Ht 67.0 in | Wt 228.8 lb

## 2020-12-26 DIAGNOSIS — R0683 Snoring: Secondary | ICD-10-CM | POA: Diagnosis not present
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
## 2020-12-26 NOTE — Patient Instructions (Signed)
Will arrange for home sleep study Will call to arrange for follow up after sleep study reviewed  

## 2021-02-02 ENCOUNTER — Ambulatory Visit: Payer: No Typology Code available for payment source

## 2021-02-02 ENCOUNTER — Other Ambulatory Visit: Payer: Self-pay

## 2021-02-02 DIAGNOSIS — R0683 Snoring: Secondary | ICD-10-CM

## 2021-02-02 DIAGNOSIS — G4733 Obstructive sleep apnea (adult) (pediatric): Secondary | ICD-10-CM | POA: Diagnosis not present

## 2021-02-07 ENCOUNTER — Telehealth: Payer: Self-pay | Admitting: Pulmonary Disease

## 2021-02-07 DIAGNOSIS — G4733 Obstructive sleep apnea (adult) (pediatric): Secondary | ICD-10-CM

## 2021-02-07 NOTE — Telephone Encounter (Signed)
HST 02/05/21 >> AHI 53, SpO2 low 75%  Please inform him that his sleep study shows severe obstructive sleep apnea.  Please arrange for ROV with me or NP to discuss treatment options.

## 2021-02-09 NOTE — Telephone Encounter (Signed)
Spoke with patient regarding HST results. They verbalized understanding. No further questions. He is scheduled for follow up with Geraldo Pitter. Nothing further needed at this time.

## 2021-02-09 NOTE — Telephone Encounter (Signed)
ATC patient, LMTCB 

## 2021-02-23 ENCOUNTER — Ambulatory Visit: Payer: No Typology Code available for payment source | Admitting: Primary Care

## 2021-02-27 ENCOUNTER — Ambulatory Visit: Payer: No Typology Code available for payment source | Admitting: Primary Care

## 2021-03-05 ENCOUNTER — Encounter: Payer: Self-pay | Admitting: Adult Health

## 2021-03-05 ENCOUNTER — Other Ambulatory Visit: Payer: Self-pay

## 2021-03-05 ENCOUNTER — Ambulatory Visit (INDEPENDENT_AMBULATORY_CARE_PROVIDER_SITE_OTHER): Payer: No Typology Code available for payment source | Admitting: Adult Health

## 2021-03-05 DIAGNOSIS — G4733 Obstructive sleep apnea (adult) (pediatric): Secondary | ICD-10-CM | POA: Diagnosis not present

## 2021-03-05 NOTE — Assessment & Plan Note (Signed)
Healthy weight loss discussed 

## 2021-03-05 NOTE — Patient Instructions (Signed)
Begin CPAP At bedtime   Wear CPAP all night long.  Work on healthy weight loss.  Do not drive if sleepy  Follow up with Dr. Halford Chessman  or Jonel Sick in 2-3 months and As needed

## 2021-03-05 NOTE — Assessment & Plan Note (Signed)
Severe obstructive sleep apnea-patient education on sleep apnea and treatment options.  Patient will begin CPAP at bedtime 5-15 AutoSet. Patient did have significant hypoxemia with associated sleep apnea.  Can consider an Ono on CPAP once started.   Plan  Patient Instructions  Begin CPAP At bedtime   Wear CPAP all night long.  Work on healthy weight loss.  Do not drive if sleepy  Follow up with Dr. Halford Chessman  or Andi Mahaffy in 2-3 months and As needed

## 2021-03-05 NOTE — Progress Notes (Signed)
@Patient  ID: James Robbins, male    DOB: 1950-12-18, 70 y.o.   MRN: [id number]  Chief Complaint  Patient presents with   Follow-up    Referring provider: Ria Bush, MD  HPI: 70 year old male seen for sleep consult December 26, 2020 for snoring found to have severe obstructive sleep apnea  TEST/EVENTS :  HST 02/05/21 >> AHI 53, SpO2 low 75%  03/05/2021 Follow up : OSA  Patient returns for a follow-up visit.  Patient was seen in July 2022 for snoring.  He was set up for home sleep study completed February 05, 2021 that showed severe obstructive sleep apnea with AHI at 53/hour and SPO2 low at 75%.  We discussed his sleep study results.  Patient education on sleep apnea and treatment options.  We went over treatment options to include weight loss, and CPAP. Medical history consist of diabetes, chronic kidney disease, hyperlipidemia, hypertension and atrial fibrillation.  No Known Allergies  Immunization History  Administered Date(s) Administered   Influenza Split 02/05/2011   Influenza, High Dose Seasonal PF 04/19/2016, 04/25/2017, 03/11/2018, 03/22/2019, 03/10/2020   Influenza-Unspecified 02/23/2015, 03/10/2020   PFIZER(Purple Top)SARS-COV-2 Vaccination 08/02/2019, 08/23/2019, 03/10/2020, 03/25/2020   Pneumococcal Conjugate-13 02/21/2014   Pneumococcal Polysaccharide-23 07/20/2015, 04/19/2016, 04/25/2017, 03/13/2018, 03/12/2019   Zoster, Live 06/10/2010    Past Medical History:  Diagnosis Date   Atrial fibrillation ([place])    Chronic kidney disease (CKD), stage I    proteinuria   Controlled type 2 diabetes mellitus with diabetic nephropathy ([place])    Completed DSME 2017   Hematuria 2014   s/p normal urological workup ([place])   Hyperlipidemia    Hypertension    Obesity    Seasonal allergic rhinitis     Tobacco History: Social History   Tobacco Use  Smoking Status Former   Types: Cigarettes   Quit date: 06/09/1986   Years since quitting: 34.7  Smokeless Tobacco Never    Counseling given: Not Answered   Outpatient Medications Prior to Visit  Medication Sig Dispense Refill   acetaminophen (TYLENOL) 650 MG CR tablet Take 650 mg by mouth 2 (two) times daily.     apixaban (ELIQUIS) 5 MG TABS tablet Take 1 tablet (5 mg total) by mouth 2 (two) times daily. 60 tablet 3   cetirizine (ZYRTEC) 10 MG tablet Take 10 mg by mouth daily.     Cholecalciferol (VITAMIN D3) 1000 units CAPS Take 1 capsule (1,000 Units total) by mouth daily. 30 capsule    diclofenac Sodium (VOLTAREN) 1 % GEL Apply 1 application topically 4 (four) times daily as needed (pain).     enalapril (VASOTEC) 20 MG tablet Take 1 tablet (20 mg total) by mouth daily. 90 tablet 3   fluticasone (FLONASE) 50 MCG/ACT nasal spray Place 1 spray into both nostrils daily as needed for allergies or rhinitis.     lidocaine (LIDODERM) 5 % Place 1 patch onto the skin daily. Remove & Discard patch within 12 hours or as directed by MD 30 patch 0   metFORMIN (GLUCOPHAGE) 1000 MG tablet Take 1 tablet (1,000 mg total) by mouth 2 (two) times daily with a meal. 180 tablet 3   metoprolol succinate (TOPROL-XL) 50 MG 24 hr tablet Take 1 tablet by mouth once daily 90 tablet 1   simvastatin (ZOCOR) 40 MG tablet Take 1 tablet (40 mg total) by mouth at bedtime. 90 tablet 3   Tuberculin-Allergy Syringes (ALLERGY SYRINGE 1CC/27GX1/2") 27G X 1/2" 1 ML MISC See admin instructions.     No  facility-administered medications prior to visit.     Review of Systems:   Constitutional:   No  weight loss, night sweats,  Fevers, chills, fatigue, or  lassitude.  HEENT:   No headaches,  Difficulty swallowing,  Tooth/dental problems, or  Sore throat,                No sneezing, itching, ear ache, nasal congestion, post nasal drip,   CV:  No chest pain,  Orthopnea, PND, swelling in lower extremities, anasarca, dizziness, palpitations, syncope.   GI  No heartburn, indigestion, abdominal pain, nausea, vomiting, diarrhea, change in bowel  habits, loss of appetite, bloody stools.   Resp: No shortness of breath with exertion or at rest.  No excess mucus, no productive cough,  No non-productive cough,  No coughing up of blood.  No change in color of mucus.  No wheezing.  No chest wall deformity  Skin: no rash or lesions.  GU: no dysuria, change in color of urine, no urgency or frequency.  No flank pain, no hematuria   MS:  No joint pain or swelling.  No decreased range of motion.  No back pain.    Physical Exam  BP (!) 128/96 (BP Location: Left Arm, Cuff Size: Normal)   Pulse 87   Temp 98.1 F (36.7 C) (Oral)   Ht 5\' 8"  (1.727 m)   Wt 231 lb 3.2 oz (104.9 kg)   SpO2 96%   BMI 35.15 kg/m   GEN: A/Ox3; pleasant , NAD, well nourished    HEENT:  [place]/AT,  EACs-clear, TMs-wnl, NOSE-clear, THROAT-clear, no lesions, no postnasal drip or exudate noted. Class 3 MP airway   NECK:  Supple w/ fair ROM; no JVD; normal carotid impulses w/o bruits; no thyromegaly or nodules palpated; no lymphadenopathy.    RESP  Clear  P & A; w/o, wheezes/ rales/ or rhonchi. no accessory muscle use, no dullness to percussion  CARD:  RRR, no m/r/g, no peripheral edema, pulses intact, no cyanosis or clubbing.  GI:   Soft & nt; nml bowel sounds; no organomegaly or masses detected.   Musco: Warm bil, no deformities or joint swelling noted.   Neuro: alert, no focal deficits noted.    Skin: Warm, no lesions or rashes    Lab Results:  CBC    Component Value Date/Time   WBC 9.0 11/15/2020 1630   RBC 4.68 11/15/2020 1630   HGB 13.8 11/15/2020 1630   HCT 43.2 11/15/2020 1630   PLT 264 11/15/2020 1630   MCV 92.3 11/15/2020 1630   MCH 29.5 11/15/2020 1630   MCHC 31.9 11/15/2020 1630   RDW 13.1 11/15/2020 1630   LYMPHSABS 1.7 09/18/2016 0917   MONOABS 0.6 09/18/2016 0917   EOSABS 0.2 09/18/2016 0917   BASOSABS 0.0 09/18/2016 0917    BMET    Component Value Date/Time   NA 140 11/15/2020 1630   K 4.7 11/15/2020 1630   CL 103  11/15/2020 1630   CO2 28 11/15/2020 1630   GLUCOSE 93 11/15/2020 1630   BUN 24 (H) 11/15/2020 1630   CREATININE 1.02 11/15/2020 1630   CREATININE 0.87 12/24/2011 1000   CALCIUM 10.1 11/15/2020 1630   GFRNONAA >60 11/15/2020 1630    BNP No results found for: BNP  ProBNP No results found for: PROBNP  Imaging: No results found.    No flowsheet data found.  No results found for: NITRICOXIDE      Assessment & Plan:   OSA (obstructive sleep apnea) Severe obstructive  sleep apnea-patient education on sleep apnea and treatment options.  Patient will begin CPAP at bedtime 5-15 AutoSet. Patient did have significant hypoxemia with associated sleep apnea.  Can consider an Ono on CPAP once started.   Plan  Patient Instructions  Begin CPAP At bedtime   Wear CPAP all night long.  Work on healthy weight loss.  Do not drive if sleepy  Follow up with Dr. Halford Chessman  or Kisa Fujii in 2-3 months and As needed        Severe obesity (BMI 35.0-39.9) with comorbidity ([place]) Healthy weight loss discussed     Rexene Edison, NP 03/05/2021

## 2021-03-05 NOTE — Addendum Note (Signed)
Addended by: Vanessa Barbara on: 03/05/2021 05:39 PM   Modules accepted: Orders

## 2021-03-06 NOTE — Progress Notes (Signed)
Reviewed and agree with assessment/plan.   Chesley Mires, MD [facility]/Critical Care 03/06/2021, 8:32 AM Pager:  [phone]

## 2021-04-02 ENCOUNTER — Other Ambulatory Visit (HOSPITAL_COMMUNITY): Payer: Self-pay | Admitting: Nurse Practitioner

## 2021-04-05 ENCOUNTER — Encounter: Payer: Self-pay | Admitting: Family Medicine

## 2021-04-05 DIAGNOSIS — H34231 Retinal artery branch occlusion, right eye: Secondary | ICD-10-CM | POA: Insufficient documentation

## 2021-04-13 ENCOUNTER — Other Ambulatory Visit: Payer: Self-pay | Admitting: Family Medicine

## 2021-04-30 ENCOUNTER — Other Ambulatory Visit: Payer: PRIVATE HEALTH INSURANCE

## 2021-05-07 ENCOUNTER — Other Ambulatory Visit: Payer: PRIVATE HEALTH INSURANCE

## 2021-05-08 ENCOUNTER — Encounter: Payer: PRIVATE HEALTH INSURANCE | Admitting: Family Medicine

## 2021-05-31 ENCOUNTER — Ambulatory Visit: Payer: No Typology Code available for payment source | Admitting: Adult Health

## 2021-06-09 ENCOUNTER — Other Ambulatory Visit: Payer: Self-pay | Admitting: Family Medicine

## 2021-06-14 ENCOUNTER — Ambulatory Visit: Payer: No Typology Code available for payment source | Admitting: Adult Health

## 2021-06-18 ENCOUNTER — Ambulatory Visit: Payer: Medicare HMO | Admitting: Nurse Practitioner

## 2021-06-18 ENCOUNTER — Other Ambulatory Visit: Payer: Self-pay

## 2021-06-18 ENCOUNTER — Encounter: Payer: Self-pay | Admitting: Nurse Practitioner

## 2021-06-18 VITALS — BP 160/90 | HR 84 | Temp 98.6°F | Ht 68.0 in | Wt 234.4 lb

## 2021-06-18 DIAGNOSIS — G4733 Obstructive sleep apnea (adult) (pediatric): Secondary | ICD-10-CM

## 2021-06-18 NOTE — Progress Notes (Signed)
@Patient  ID: James Robbins, male    DOB: Jun 21, 1950, 71 y.o.   MRN: [id number]  Chief Complaint  Patient presents with   Follow-up    CPAP f/u.  Still getting used to it.    Referring provider: Ria Bush, MD  HPI: 71 year old male, former smoker followed for severe obstructive sleep apnea. He is a patient of Dr. Juanetta Gosling and last seen in office on 03/05/2021 by Royal Piedra, NP. Past medical history significant for HTN, PAF, seasonal allergic rhinitis, HLD, CKD stage II, DM II, osteoarthritis, severe obesity.   TEST/EVENTS:  02/05/2021 HST: AHI 53, SpO2 low 75%  03/05/2021: OV with Parrett,NP. Discussed HST results and necessity of CPAP therapy. Ordered CPAP 5-15 cmH2O auto. May need to consider ONO once CPAP therapy initiated.   06/18/2021: Today - CPAP follow up Patient presents today with wife for follow up after initiation of CPAP therapy approx 1 month ago. He reports wearing his CPAP nightly but he has had some issues with the mask leaking and making nose which causes him to wear it for only part of the night and then he removes it. He has noticed an improvement in his daytime fatigue symptoms and wakes up feeling more rested in the morning. He reports getting between 5-6 hours of sleep a night. He denies drowsy driving or morning headaches. Overall, he feels better but wishes his mask fit better.  [facility] Download CPAP auto 5-15 cmH2O: 28/30 days with 22 days (73%) >4 hours Av usage 4 hours 57 min Median pressure 11.3 cmH2o, Max 14.3 cmH2O Leaks median 10.4 L/min, 95th 29.1 L/min AHI 16.6  No Known Allergies  Immunization History  Administered Date(s) Administered   Influenza Split 02/05/2011   Influenza, High Dose Seasonal PF 04/19/2016, 04/25/2017, 03/11/2018, 03/22/2019, 03/10/2020, 03/23/2021   Influenza-Unspecified 02/23/2015, 03/10/2020   PFIZER(Purple Top)SARS-COV-2 Vaccination 08/02/2019, 08/23/2019, 03/10/2020, 03/25/2020   Pneumococcal Conjugate-13 02/21/2014    Pneumococcal Polysaccharide-23 07/20/2015, 04/19/2016, 04/25/2017, 03/13/2018, 03/12/2019, 03/23/2021   Zoster, Live 06/10/2010    Past Medical History:  Diagnosis Date   Atrial fibrillation ([place])    Chronic kidney disease (CKD), stage I    proteinuria   Controlled type 2 diabetes mellitus with diabetic nephropathy ([place])    Completed DSME 2017   Hematuria 2014   s/p normal urological workup ([place])   Hyperlipidemia    Hypertension    Obesity    Seasonal allergic rhinitis     Tobacco History: Social History   Tobacco Use  Smoking Status Former   Types: Cigarettes   Quit date: 06/09/1986   Years since quitting: 35.0  Smokeless Tobacco Never   Counseling given: Not Answered   Outpatient Medications Prior to Visit  Medication Sig Dispense Refill   acetaminophen (TYLENOL) 650 MG CR tablet Take 650 mg by mouth 2 (two) times daily.     cetirizine (ZYRTEC) 10 MG tablet Take 10 mg by mouth daily.     Cholecalciferol (VITAMIN D3) 1000 units CAPS Take 1 capsule (1,000 Units total) by mouth daily. 30 capsule    diclofenac Sodium (VOLTAREN) 1 % GEL Apply 1 application topically 4 (four) times daily as needed (pain).     ELIQUIS 5 MG TABS tablet Take 1 tablet by mouth twice daily 60 tablet 3   enalapril (VASOTEC) 20 MG tablet Take 1 tablet by mouth once daily 90 tablet 0   fluticasone (FLONASE) 50 MCG/ACT nasal spray Place 1 spray into both nostrils daily as needed for allergies or rhinitis.  lidocaine (LIDODERM) 5 % Place 1 patch onto the skin daily. Remove & Discard patch within 12 hours or as directed by MD 30 patch 0   metFORMIN (GLUCOPHAGE) 1000 MG tablet Take 1 tablet (1,000 mg total) by mouth 2 (two) times daily with a meal. 180 tablet 3   metoprolol succinate (TOPROL-XL) 50 MG 24 hr tablet Take 1 tablet by mouth once daily 90 tablet 1   simvastatin (ZOCOR) 40 MG tablet Take 1 tablet (40 mg total) by mouth at bedtime. 90 tablet 3   Tuberculin-Allergy Syringes (ALLERGY SYRINGE  1CC/27GX1/2") 27G X 1/2" 1 ML MISC See admin instructions.     No facility-administered medications prior to visit.     Review of Systems:   Constitutional: No weight loss or gain, night sweats, fevers, chills. +daytime fatigue (improving) HEENT: No headaches, difficulty swallowing, tooth/dental problems, or sore throat. No sneezing, itching, ear ache, nasal congestion, or post nasal drip CV:  No chest pain, orthopnea, PND, swelling in lower extremities, anasarca, dizziness, palpitations, syncope Resp: No shortness of breath with exertion or at rest. No excess mucus or change in color of mucus. No productive or non-productive. No hemoptysis. No wheezing.  No chest wall deformity. +snoring, occasional per wife and notices with mask leaks GI:  No heartburn, indigestion, abdominal pain, nausea, vomiting, diarrhea, change in bowel habits, loss of appetite, bloody stools.  GU: No dysuria, change in color of urine, urgency or frequency.  No flank pain, no hematuria  Skin: No rash, lesions, ulcerations MSK:  No joint pain or swelling.  No decreased range of motion.  No back pain. Neuro: No dizziness or lightheadedness.  Psych: No depression or anxiety. Mood stable.     Physical Exam:  BP (!) 160/90 (BP Location: Left Arm, Patient Position: Sitting, Cuff Size: Normal)    Pulse 84    Temp 98.6 F (37 C) (Oral)    Ht 5\' 8"  (1.727 m)    Wt 234 lb 6.4 oz (106.3 kg)    SpO2 98%    BMI 35.64 kg/m   GEN: Pleasant, interactive, well-appearing; obese; in no acute distress. HEENT:  Normocephalic and atraumatic. EACs patent bilaterally. TM pearly gray with present light reflex bilaterally. PERRLA. Sclera white. Nasal turbinates pink, moist and patent bilaterally. No rhinorrhea present. Oropharynx pink and moist, without exudate or edema. No lesions, ulcerations, or postnasal drip.  NECK:  Supple w/ fair ROM. No JVD present. Normal carotid impulses w/o bruits. Thyroid symmetrical with no goiter or nodules  palpated. No lymphadenopathy.   CV: RRR, no m/r/g, no peripheral edema. Pulses intact, +2 bilaterally. No cyanosis, pallor or clubbing. PULMONARY:  Unlabored, regular breathing. Clear bilaterally A&P w/o wheezes/rales/rhonchi. No accessory muscle use. No dullness to percussion. GI: BS present and normoactive. Soft, non-tender to palpation. No organomegaly or masses detected. No CVA tenderness. MSK: No erythema, warmth or tenderness. Cap refil <2 sec all extrem. No deformities or joint swelling noted.  Neuro: A/Ox3. No focal deficits noted.   Skin: Warm, no lesions or rashe Psych: Normal affect and behavior. Judgement and thought content appropriate.     Lab Results:  CBC    Component Value Date/Time   WBC 9.0 11/15/2020 1630   RBC 4.68 11/15/2020 1630   HGB 13.8 11/15/2020 1630   HCT 43.2 11/15/2020 1630   PLT 264 11/15/2020 1630   MCV 92.3 11/15/2020 1630   MCH 29.5 11/15/2020 1630   MCHC 31.9 11/15/2020 1630   RDW 13.1 11/15/2020 1630   LYMPHSABS  1.7 09/18/2016 0917   MONOABS 0.6 09/18/2016 0917   EOSABS 0.2 09/18/2016 0917   BASOSABS 0.0 09/18/2016 0917    BMET    Component Value Date/Time   NA 140 11/15/2020 1630   K 4.7 11/15/2020 1630   CL 103 11/15/2020 1630   CO2 28 11/15/2020 1630   GLUCOSE 93 11/15/2020 1630   BUN 24 (H) 11/15/2020 1630   CREATININE 1.02 11/15/2020 1630   CREATININE 0.87 12/24/2011 1000   CALCIUM 10.1 11/15/2020 1630   GFRNONAA >60 11/15/2020 1630    BNP No results found for: BNP   Imaging:  No results found.    No flowsheet data found.  No results found for: NITRICOXIDE      Assessment & Plan:   Obstructive sleep apnea hypopnea, severe Compliant with CPAP therapy but having difficulties with mask and leakage. Will change from nasal mask to full face mask - order sent to DME. Encouraged continued use.   Patient Instructions  Continue to use CPAP every night, minimum of 6 hours a night.  Change equipment every 30 days or  as directed by DME. Wash your tubing with warm soap and water daily, hang to dry. Wash humidifier portion weekly.  Maintain clean equipment, as directed by home health agency.  Be aware of reduced alertness and do not drive or operate heavy machinery if experiencing this or drowsiness.  Exercise encouraged, as tolerated. Healthy weight management discussed.  Avoid or decrease alcohol consumption and medications that make you more sleepy, if possible. Notify if persistent daytime sleepiness occurs even with consistent use of CPAP.   We have sent an order for a full face mask. Notify us if you are still experiencing issues with leaks after transitioning to this mask.   We discussed how untreated sleep apnea puts an individual at risk for cardiac arrhthymias, pulm HTN, DM, stroke and increases their risk for daytime accidents.   Follow up in 1 month with Dr. Halford Chessman or Alanson Aly. If symptoms do not improve or worsen, please contact office for sooner follow up or seek emergency care.    Clayton Bibles, NP 06/18/2021  Pt aware and understands NP's role.

## 2021-06-18 NOTE — Patient Instructions (Signed)
Continue to use CPAP every night, minimum of 6 hours a night.  Change equipment every 30 days or as directed by DME. Wash your tubing with warm soap and water daily, hang to dry. Wash humidifier portion weekly.  Maintain clean equipment, as directed by home health agency.  Be aware of reduced alertness and do not drive or operate heavy machinery if experiencing this or drowsiness.  Exercise encouraged, as tolerated. Healthy weight management discussed.  Avoid or decrease alcohol consumption and medications that make you more sleepy, if possible. Notify if persistent daytime sleepiness occurs even with consistent use of CPAP.   We have sent an order for a full face mask. Notify us if you are still experiencing issues with leaks after transitioning to this mask.   We discussed how untreated sleep apnea puts an individual at risk for cardiac arrhthymias, pulm HTN, DM, stroke and increases their risk for daytime accidents.   Follow up in 1 month with Dr. Halford Chessman or Alanson Aly. If symptoms do not improve or worsen, please contact office for sooner follow up or seek emergency care.

## 2021-06-18 NOTE — Progress Notes (Signed)
Reviewed and agree with assessment/plan.   Chesley Mires, MD [facility] Pulmonary/Critical Care 06/18/2021, 5:13 PM Pager:  [phone]

## 2021-06-18 NOTE — Assessment & Plan Note (Signed)
Compliant with CPAP therapy but having difficulties with mask and leakage. Will change from nasal mask to full face mask - order sent to DME. Encouraged continued use.   Patient Instructions  Continue to use CPAP every night, minimum of 6 hours a night.  Change equipment every 30 days or as directed by DME. Wash your tubing with warm soap and water daily, hang to dry. Wash humidifier portion weekly.  Maintain clean equipment, as directed by home health agency.  Be aware of reduced alertness and do not drive or operate heavy machinery if experiencing this or drowsiness.  Exercise encouraged, as tolerated. Healthy weight management discussed.  Avoid or decrease alcohol consumption and medications that make you more sleepy, if possible. Notify if persistent daytime sleepiness occurs even with consistent use of CPAP.   We have sent an order for a full face mask. Notify us if you are still experiencing issues with leaks after transitioning to this mask.   We discussed how untreated sleep apnea puts an individual at risk for cardiac arrhthymias, pulm HTN, DM, stroke and increases their risk for daytime accidents.   Follow up in 1 month with Dr. Halford Chessman or Alanson Aly. If symptoms do not improve or worsen, please contact office for sooner follow up or seek emergency care.

## 2021-06-21 ENCOUNTER — Other Ambulatory Visit: Payer: Self-pay | Admitting: Family Medicine

## 2021-07-02 ENCOUNTER — Encounter: Payer: Self-pay | Admitting: Family Medicine

## 2021-07-05 ENCOUNTER — Encounter (HOSPITAL_COMMUNITY): Payer: Self-pay | Admitting: Nurse Practitioner

## 2021-07-05 ENCOUNTER — Other Ambulatory Visit: Payer: Self-pay

## 2021-07-05 ENCOUNTER — Ambulatory Visit (HOSPITAL_COMMUNITY)
Admission: RE | Admit: 2021-07-05 | Discharge: 2021-07-05 | Disposition: A | Discharge status: Home / Self Care | Payer: Medicare HMO | Source: Ambulatory Visit | Attending: Nurse Practitioner | Admitting: Nurse Practitioner

## 2021-07-05 VITALS — BP 188/84 | HR 94 | Ht 68.0 in | Wt 236.8 lb

## 2021-07-05 DIAGNOSIS — Z833 Family history of diabetes mellitus: Secondary | ICD-10-CM | POA: Diagnosis not present

## 2021-07-05 DIAGNOSIS — G4730 Sleep apnea, unspecified: Secondary | ICD-10-CM | POA: Diagnosis not present

## 2021-07-05 DIAGNOSIS — Z79899 Other long term (current) drug therapy: Secondary | ICD-10-CM | POA: Insufficient documentation

## 2021-07-05 DIAGNOSIS — E669 Obesity, unspecified: Secondary | ICD-10-CM | POA: Insufficient documentation

## 2021-07-05 DIAGNOSIS — I4819 Other persistent atrial fibrillation: Secondary | ICD-10-CM | POA: Diagnosis not present

## 2021-07-05 DIAGNOSIS — E1122 Type 2 diabetes mellitus with diabetic chronic kidney disease: Secondary | ICD-10-CM | POA: Diagnosis not present

## 2021-07-05 DIAGNOSIS — I129 Hypertensive chronic kidney disease with stage 1 through stage 4 chronic kidney disease, or unspecified chronic kidney disease: Secondary | ICD-10-CM | POA: Diagnosis not present

## 2021-07-05 DIAGNOSIS — Z9989 Dependence on other enabling machines and devices: Secondary | ICD-10-CM | POA: Diagnosis not present

## 2021-07-05 DIAGNOSIS — Z7182 Exercise counseling: Secondary | ICD-10-CM | POA: Diagnosis not present

## 2021-07-05 DIAGNOSIS — R0683 Snoring: Secondary | ICD-10-CM | POA: Diagnosis not present

## 2021-07-05 DIAGNOSIS — E1121 Type 2 diabetes mellitus with diabetic nephropathy: Secondary | ICD-10-CM | POA: Diagnosis not present

## 2021-07-05 DIAGNOSIS — Z7901 Long term (current) use of anticoagulants: Secondary | ICD-10-CM | POA: Diagnosis not present

## 2021-07-05 DIAGNOSIS — N181 Chronic kidney disease, stage 1: Secondary | ICD-10-CM | POA: Insufficient documentation

## 2021-07-05 DIAGNOSIS — Z6836 Body mass index (BMI) 36.0-36.9, adult: Secondary | ICD-10-CM | POA: Insufficient documentation

## 2021-07-05 DIAGNOSIS — Z8249 Family history of ischemic heart disease and other diseases of the circulatory system: Secondary | ICD-10-CM | POA: Diagnosis not present

## 2021-07-05 DIAGNOSIS — D6869 Other thrombophilia: Secondary | ICD-10-CM | POA: Diagnosis not present

## 2021-07-05 DIAGNOSIS — I4891 Unspecified atrial fibrillation: Secondary | ICD-10-CM | POA: Insufficient documentation

## 2021-07-05 DIAGNOSIS — Z713 Dietary counseling and surveillance: Secondary | ICD-10-CM | POA: Diagnosis not present
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
## 2021-07-18 ENCOUNTER — Other Ambulatory Visit: Payer: No Typology Code available for payment source

## 2021-07-20 ENCOUNTER — Ambulatory Visit: Payer: No Typology Code available for payment source | Admitting: Pulmonary Disease

## 2021-07-23 ENCOUNTER — Ambulatory Visit: Payer: Medicare HMO | Admitting: Pulmonary Disease

## 2021-07-23 ENCOUNTER — Encounter: Payer: Self-pay | Admitting: Pulmonary Disease

## 2021-07-23 ENCOUNTER — Other Ambulatory Visit: Payer: Self-pay

## 2021-07-23 VITALS — BP 142/86 | HR 69 | Temp 98.0°F | Ht 68.0 in | Wt 237.0 lb

## 2021-07-23 DIAGNOSIS — G4733 Obstructive sleep apnea (adult) (pediatric): Secondary | ICD-10-CM | POA: Diagnosis not present

## 2021-07-23 DIAGNOSIS — H40023 Open angle with borderline findings, high risk, bilateral: Secondary | ICD-10-CM | POA: Diagnosis not present

## 2021-07-23 DIAGNOSIS — H2512 Age-related nuclear cataract, left eye: Secondary | ICD-10-CM | POA: Diagnosis not present

## 2021-07-23 DIAGNOSIS — H401421 Capsular glaucoma with pseudoexfoliation of lens, left eye, mild stage: Secondary | ICD-10-CM | POA: Diagnosis not present

## 2021-07-23 DIAGNOSIS — H524 Presbyopia: Secondary | ICD-10-CM | POA: Diagnosis not present

## 2021-07-23 DIAGNOSIS — H34231 Retinal artery branch occlusion, right eye: Secondary | ICD-10-CM | POA: Diagnosis not present

## 2021-07-23 DIAGNOSIS — H25013 Cortical age-related cataract, bilateral: Secondary | ICD-10-CM | POA: Diagnosis not present

## 2021-07-23 DIAGNOSIS — H2513 Age-related nuclear cataract, bilateral: Secondary | ICD-10-CM | POA: Diagnosis not present

## 2021-07-23 NOTE — Patient Instructions (Signed)
Will arrange for appointment at the sleep lab to get your CPAP mask refit  Follow up in 6 months

## 2021-07-23 NOTE — Progress Notes (Signed)
[facility]  Chief Complaint  Patient presents with   Follow-up    He states having a lot of nasal congestion at night. He says he uses CPAP about 6 hours per night.     Constitutional:  BP (!) 142/86 (BP Location: Left Arm, Cuff Size: Normal)    Pulse 69    Temp 98 F (36.7 C) (Oral)    Ht 5\' 8"  (1.727 m)    Wt 237 lb (107.5 kg)    SpO2 99% Comment: on RA   BMI 36.04 kg/m   Past Medical History:  CKD, DM type 2 with neuropathy, HLD, HTN, Allergies, Atrial fibrillation  Past Surgical History:  He  has a past surgical history that includes Vasectomy; Vasectomy reversal; Colonoscopy (2006); and Cardioversion (N/A, 11/27/2020).  Brief Summary:  James Robbins is a 71 y.o. male former smoker with obstructive sleep apnea.      Subjective:   He is here with his wife.  His sleep study from August 2022 showed severe obstructive sleep apnea.  He was started on CPAP.  Feels like he is sleeping better, and not waking up as much.  He is having trouble with mask fit.  He got a nasal mask to start with.  Had trouble with mouth leak, and was still snoring.  He got a full face mask.  Didn't have as much air leak, and AHI was better.  The mask he got caused a sore over his nose.  He was told he would have to wait 6 months before getting a new mask change.  He went back to his old nasal mask.   Physical Exam:   Appearance - well kempt   ENMT - no sinus tenderness, no oral exudate, no LAN, Mallampati 3 airway, no stridor  Respiratory - equal breath sounds bilaterally, no wheezing or rales  CV - s1s2 regular rate and rhythm, no murmurs  Ext - no clubbing, no edema  Skin - no rashes  Psych - normal mood and affect    Sleep Tests:  HST 02/05/21 >> AHI 53, SpO2 low 75% Auto CPAP 06/20/21 to 07/19/21 >> used on 30 of 30 nights with average 6 hrs 3 min.  Average AHI 25 with median CPAP 11 and 95 th percentile CPAP 14 cm H2O.  Air leak  noted.  Cardiac Tests:  Echo 12/07/20 >> EF 60 to 65%, mild LA dilation, aortic root 38 mm  Social History:  He  reports that he quit smoking about 35 years ago. His smoking use included cigarettes. He has never used smokeless tobacco. He reports that he does not drink alcohol and does not use drugs.  Family History:  His family history includes CAD in his father; Diabetes in his mother; Hyperlipidemia in his mother; Hypertension in his mother; Osteoporosis in his sister.     Assessment/Plan:   Obstructive sleep apnea. - he is compliant with therapy and reports benefit from CPAP use - he uses [employer] for his DME - continue auto CPAP 5 to 15 cm H2O - main issue is related to mask fit; has mouth leak with nasal mask, and the one full face mask he tried caused soreness over his nose - will set up appointment at the sleep lab to get mask refitting  Atrial fibrillation. - followed by [facility]  Obesity. - discussed how weight can impact sleep and risk for sleep disordered breathing - discussed options to assist with weight loss:  combination of diet modification, cardiovascular and strength training exercises   Time Spent Involved in Patient Care on Day of Examination:  26 minutes  Follow up:   Patient Instructions  Will arrange for appointment at the sleep lab to get your CPAP mask refit  Follow up in 6 months  Medication List:   Allergies as of 07/23/2021   No Known Allergies      Medication List        Accurate as of July 23, 2021  2:21 PM. If you have any questions, ask your nurse or doctor.          acetaminophen 650 MG CR tablet Commonly known as: TYLENOL Take 650 mg by mouth 2 (two) times daily.   ALLERGY SYRINGE 1CC/27GX1/2" 27G X 1/2" 1 ML Misc See admin instructions.   cetirizine 10 MG tablet Commonly known as: ZYRTEC Take 10 mg by mouth daily.   diclofenac Sodium 1 % Gel Commonly known as: VOLTAREN Apply 1 application topically 4 (four)  times daily as needed (pain).   Eliquis 5 MG Tabs tablet Generic drug: apixaban Take 1 tablet by mouth twice daily   enalapril 20 MG tablet Commonly known as: VASOTEC Take 1 tablet by mouth once daily   fluticasone 50 MCG/ACT nasal spray Commonly known as: FLONASE Place 1 spray into both nostrils daily as needed for allergies or rhinitis.   metFORMIN 1000 MG tablet Commonly known as: GLUCOPHAGE TAKE 1 TABLET BY MOUTH TWICE DAILY WITH MEALS   metoprolol succinate 50 MG 24 hr tablet Commonly known as: TOPROL-XL Take 1 tablet by mouth once daily   simvastatin 40 MG tablet Commonly known as: ZOCOR Take 1 tablet (40 mg total) by mouth at bedtime.   Vitamin D3 25 MCG (1000 UT) Caps Take 1 capsule (1,000 Units total) by mouth daily.        Signature:  Chesley Mires, MD [place] Pager - [phone] 07/23/2021, 2:21 PM

## 2021-07-25 ENCOUNTER — Encounter: Payer: No Typology Code available for payment source | Admitting: Family Medicine

## 2021-08-02 ENCOUNTER — Other Ambulatory Visit: Payer: Self-pay | Admitting: Family Medicine

## 2021-08-07 ENCOUNTER — Other Ambulatory Visit (HOSPITAL_BASED_OUTPATIENT_CLINIC_OR_DEPARTMENT_OTHER): Payer: No Typology Code available for payment source | Admitting: Pulmonary Disease

## 2021-08-07 DIAGNOSIS — H5703 Miosis: Secondary | ICD-10-CM | POA: Diagnosis not present

## 2021-08-07 DIAGNOSIS — H2512 Age-related nuclear cataract, left eye: Secondary | ICD-10-CM | POA: Diagnosis not present

## 2021-08-07 DIAGNOSIS — H25812 Combined forms of age-related cataract, left eye: Secondary | ICD-10-CM | POA: Diagnosis not present

## 2021-08-07 DIAGNOSIS — H268 Other specified cataract: Secondary | ICD-10-CM | POA: Diagnosis not present

## 2021-08-08 HISTORY — PX: CATARACT EXTRACTION: SUR2

## 2021-08-10 ENCOUNTER — Encounter: Payer: Self-pay | Admitting: Family Medicine

## 2021-08-11 ENCOUNTER — Other Ambulatory Visit (HOSPITAL_COMMUNITY): Payer: Self-pay | Admitting: Nurse Practitioner

## 2021-08-13 ENCOUNTER — Other Ambulatory Visit (HOSPITAL_BASED_OUTPATIENT_CLINIC_OR_DEPARTMENT_OTHER): Payer: No Typology Code available for payment source | Admitting: Pulmonary Disease

## 2021-08-27 DIAGNOSIS — Z1211 Encounter for screening for malignant neoplasm of colon: Secondary | ICD-10-CM | POA: Diagnosis not present

## 2021-08-29 ENCOUNTER — Other Ambulatory Visit: Payer: Self-pay | Admitting: Family Medicine

## 2021-08-29 ENCOUNTER — Other Ambulatory Visit (INDEPENDENT_AMBULATORY_CARE_PROVIDER_SITE_OTHER): Payer: Medicare HMO

## 2021-08-29 ENCOUNTER — Other Ambulatory Visit: Payer: Self-pay

## 2021-08-29 DIAGNOSIS — I4821 Permanent atrial fibrillation: Secondary | ICD-10-CM | POA: Diagnosis not present

## 2021-08-29 DIAGNOSIS — E1122 Type 2 diabetes mellitus with diabetic chronic kidney disease: Secondary | ICD-10-CM | POA: Diagnosis not present

## 2021-08-29 DIAGNOSIS — E785 Hyperlipidemia, unspecified: Secondary | ICD-10-CM | POA: Diagnosis not present

## 2021-08-29 DIAGNOSIS — E559 Vitamin D deficiency, unspecified: Secondary | ICD-10-CM

## 2021-08-29 DIAGNOSIS — N182 Chronic kidney disease, stage 2 (mild): Secondary | ICD-10-CM | POA: Diagnosis not present

## 2021-08-29 DIAGNOSIS — Z125 Encounter for screening for malignant neoplasm of prostate: Secondary | ICD-10-CM

## 2021-08-29 DIAGNOSIS — E1169 Type 2 diabetes mellitus with other specified complication: Secondary | ICD-10-CM

## 2021-08-29 DIAGNOSIS — E1121 Type 2 diabetes mellitus with diabetic nephropathy: Secondary | ICD-10-CM | POA: Diagnosis not present

## 2021-08-29 LAB — COMPREHENSIVE METABOLIC PANEL
ALT: 17 U/L (ref 0–53)
AST: 13 U/L (ref 0–37)
Albumin: 4.2 g/dL (ref 3.5–5.2)
Alkaline Phosphatase: 62 U/L (ref 39–117)
BUN: 20 mg/dL (ref 6–23)
CO2: 27 mEq/L (ref 19–32)
Calcium: 9.4 mg/dL (ref 8.4–10.5)
Chloride: 105 mEq/L (ref 96–112)
Creatinine, Ser: 1.13 mg/dL (ref 0.40–1.50)
GFR: 64.88 mL/min (ref >60.00)
Glucose, Bld: 157 mg/dL — ABNORMAL HIGH (ref 70–99)
Potassium: 4.2 mEq/L (ref 3.5–5.1)
Sodium: 140 mEq/L (ref 135–145)
Total Bilirubin: 0.4 mg/dL (ref 0.2–1.2)
Total Protein: 6.4 g/dL (ref 6.0–8.3)

## 2021-08-29 LAB — CBC WITH DIFFERENTIAL/PLATELET
Basophils Absolute: 0 10*3/uL (ref 0.0–0.1)
Basophils Relative: 0.5 % (ref 0.0–3.0)
Eosinophils Absolute: 0.2 10*3/uL (ref 0.0–0.7)
Eosinophils Relative: 2.2 % (ref 0.0–5.0)
HCT: 41.4 % (ref 39.0–52.0)
Hemoglobin: 13.9 g/dL (ref 13.0–17.0)
Lymphocytes Relative: 20 % (ref 12.0–46.0)
Lymphs Abs: 1.5 10*3/uL (ref 0.7–4.0)
MCHC: 33.6 g/dL (ref 30.0–36.0)
MCV: 88.2 fl (ref 78.0–100.0)
Monocytes Absolute: 0.6 10*3/uL (ref 0.1–1.0)
Monocytes Relative: 7.8 % (ref 3.0–12.0)
Neutro Abs: 5.3 10*3/uL (ref 1.4–7.7)
Neutrophils Relative %: 69.5 % (ref 43.0–77.0)
Platelets: 209 10*3/uL (ref 150.0–400.0)
RBC: 4.69 Mil/uL (ref 4.22–5.81)
RDW: 14 % (ref 11.5–15.5)
WBC: 7.7 10*3/uL (ref 4.0–10.5)

## 2021-08-29 LAB — PSA: PSA: 1.12 ng/mL (ref 0.10–4.00)

## 2021-08-29 LAB — LIPID PANEL
Cholesterol: 148 mg/dL (ref 0–200)
HDL: 43.1 mg/dL (ref >39.00)
LDL Cholesterol: 86 mg/dL (ref 0–99)
NonHDL: 104.6
Total CHOL/HDL Ratio: 3
Triglycerides: 92 mg/dL (ref 0.0–149.0)
VLDL: 18.4 mg/dL (ref 0.0–40.0)

## 2021-08-29 LAB — HEMOGLOBIN A1C: Hgb A1c MFr Bld: 6.7 % — ABNORMAL HIGH (ref 4.6–6.5)

## 2021-08-29 LAB — MICROALBUMIN / CREATININE URINE RATIO
Creatinine,U: 110.4 mg/dL
Microalb Creat Ratio: 82.6 mg/g — ABNORMAL HIGH (ref 0.0–30.0)
Microalb, Ur: 91.2 mg/dL — ABNORMAL HIGH (ref 0.0–1.9)

## 2021-08-29 LAB — VITAMIN D 25 HYDROXY (VIT D DEFICIENCY, FRACTURES): VITD: 25.37 ng/mL — ABNORMAL LOW (ref 30.00–100.00)

## 2021-09-01 LAB — COLOGUARD: COLOGUARD: NEGATIVE

## 2021-09-05 ENCOUNTER — Ambulatory Visit (INDEPENDENT_AMBULATORY_CARE_PROVIDER_SITE_OTHER): Payer: Medicare HMO | Admitting: Family Medicine

## 2021-09-05 ENCOUNTER — Other Ambulatory Visit: Payer: Self-pay

## 2021-09-05 ENCOUNTER — Encounter: Payer: Self-pay | Admitting: Family Medicine

## 2021-09-05 VITALS — BP 138/86 | HR 87 | Temp 98.4°F | Ht 66.5 in | Wt 230.2 lb

## 2021-09-05 DIAGNOSIS — Z136 Encounter for screening for cardiovascular disorders: Secondary | ICD-10-CM

## 2021-09-05 DIAGNOSIS — E1129 Type 2 diabetes mellitus with other diabetic kidney complication: Secondary | ICD-10-CM

## 2021-09-05 DIAGNOSIS — Z1283 Encounter for screening for malignant neoplasm of skin: Secondary | ICD-10-CM | POA: Diagnosis not present

## 2021-09-05 DIAGNOSIS — Z7189 Other specified counseling: Secondary | ICD-10-CM | POA: Diagnosis not present

## 2021-09-05 DIAGNOSIS — E785 Hyperlipidemia, unspecified: Secondary | ICD-10-CM

## 2021-09-05 DIAGNOSIS — H34231 Retinal artery branch occlusion, right eye: Secondary | ICD-10-CM

## 2021-09-05 DIAGNOSIS — Z0000 Encounter for general adult medical examination without abnormal findings: Secondary | ICD-10-CM

## 2021-09-05 DIAGNOSIS — E559 Vitamin D deficiency, unspecified: Secondary | ICD-10-CM

## 2021-09-05 DIAGNOSIS — I10 Essential (primary) hypertension: Secondary | ICD-10-CM

## 2021-09-05 DIAGNOSIS — R809 Proteinuria, unspecified: Secondary | ICD-10-CM

## 2021-09-05 DIAGNOSIS — I4821 Permanent atrial fibrillation: Secondary | ICD-10-CM

## 2021-09-05 DIAGNOSIS — E1121 Type 2 diabetes mellitus with diabetic nephropathy: Secondary | ICD-10-CM

## 2021-09-05 DIAGNOSIS — G4733 Obstructive sleep apnea (adult) (pediatric): Secondary | ICD-10-CM

## 2021-09-05 DIAGNOSIS — E1169 Type 2 diabetes mellitus with other specified complication: Secondary | ICD-10-CM

## 2021-09-05 DIAGNOSIS — H5461 Unqualified visual loss, right eye, normal vision left eye: Secondary | ICD-10-CM

## 2021-09-05 MED ORDER — METOPROLOL SUCCINATE ER 50 MG PO TB24: 50.0000 mg | ORAL_TABLET | Freq: Every day | ORAL | 3 refills | Status: DC

## 2021-09-05 MED ORDER — SIMVASTATIN 40 MG PO TABS: 40.0000 mg | ORAL_TABLET | Freq: Every day | ORAL | 3 refills | Status: DC

## 2021-09-05 MED ORDER — METFORMIN HCL 1000 MG PO TABS: 1000.0000 mg | ORAL_TABLET | Freq: Two times a day (BID) | ORAL | 3 refills | Status: DC

## 2021-09-05 MED ORDER — EMPAGLIFLOZIN 10 MG PO TABS: 10.0000 mg | ORAL_TABLET | Freq: Every day | ORAL | 6 refills | Status: DC

## 2021-09-05 MED ORDER — ENALAPRIL MALEATE 20 MG PO TABS: 20.0000 mg | ORAL_TABLET | Freq: Every day | ORAL | 3 refills | Status: DC

## 2021-09-05 MED ORDER — VITAMIN D3 25 MCG (1000 UT) PO CAPS: 2.0000 | ORAL_CAPSULE | Freq: Every day | ORAL | Status: DC

## 2021-09-05 NOTE — Progress Notes (Addendum)
? ? Patient ID: James Robbins, male    DOB: 11/22/1950, 71 y.o.   MRN: [id number] ? ?This visit was conducted in person. ? ?BP 138/86   Pulse 87   Temp 98.4 ?F (36.9 ?C) (Temporal)   Ht 5' 6.5" (1.689 m)   Wt 230 lb 4 oz (104.4 kg)   SpO2 93%   BMI 36.61 kg/m?   ? ?CC: welcome to medicare visit  ?Subjective:  ? ?HPI: ?James Robbins is a 71 y.o. male presenting on 09/05/2021 for Welcome to Medicare Exam (Pt accompanied by wife, Mel Almond. ) ? ? ?Just started medicare B.  ?Did not see health advisor this year. ? ?Hearing Screening  ? '500Hz'$  '1000Hz'$  '2000Hz'$  '4000Hz'$   ?Right ear 40 0 20 40  ?Left ear 40 40 20 0  ? ?Vision Screening  ? Right eye Left eye Both eyes  ?Without correction 0 20/25 20/25  ?With correction     ?Chronic R vision loss - has started seeing Dr [name] ?[facility] Office Visit from 09/05/2021 in [facility] at [facility]  ?PHQ-2 Total Score 0  ? ?  ?  ? ?  09/05/2021  ? 11:50 AM 07/20/2015  ? 11:33 AM  ?Fall Risk   ?Falls in the past year? 0 No  ? ?Sees Dr Remus Blake allergist.  ? ?New afib 10/2020 followed by afib clinic. S/p failed cardioversion, decided to rate control with medication. Continues eliquis and metoprolol.  ? ?OSA eval last year as well - started on CPAP 5-15cmH2O. Continues struggling with mask fitting. Pending sleep lab mask fitting. He is interested in [place] device evaluation.  ? ?Preventative: ?Colon cancer screening - colonoscopy 2006 normal. Denies BM changes. No blood in stool. Last colonoscopy woke up early. Leery of colonoscopies. Cologuard negative 04/2018. He just re-submitted Cologuard this week.  ?Prostate cancer screening - yearly PSA.  ?Lung cancer screening - not eligible  ?[facility] - [facility] yearly  ?COVID vaccine [employer] x2 and booster 03/2020, bivalent 07/2021  ?Prevnar-13 - 02/2014, pneumovax 07/2015  ?zostavax 2012  ?shingrix - discussed  ?Advanced directives - does not have set up. Would want wife to be HCPOA. Packet provided today. Full code. Wouldn't want  prolonged life support if terminal condition.  ?Seat belt use discussed  ?Sunscreen use discussed, no changing moles. Wants spots on forehead checked.  ?Ex smoker quit 1987 ?Alcohol - none ?Dentist yearly - DUE ?Eye exam - started seen 2022 ?Bowel - no constipation  ?Bladder - no incontinence ? ?Lives with wife and children, no pets  ?Occupation: retired Chemical engineer  ?Edu: HS  ?Activity: no regular exercise  ?Diet: good water, fruits/vegetables daily, stopped sodas  ?   ? ?Relevant past medical, surgical, family and social history reviewed and updated as indicated. Interim medical history since our last visit reviewed. ?Allergies and medications reviewed and updated. ?Outpatient Medications Prior to Visit  ?Medication Sig Dispense Refill  ? acetaminophen (TYLENOL) 650 MG CR tablet Take 650 mg by mouth 2 (two) times daily.    ? cetirizine (ZYRTEC) 10 MG tablet Take 10 mg by mouth daily.    ? diclofenac Sodium (VOLTAREN) 1 % GEL Apply 1 application topically 4 (four) times daily as needed (pain).    ? ELIQUIS 5 MG TABS tablet Take 1 tablet by mouth twice daily 60 tablet 11  ? fluticasone (FLONASE) 50 MCG/ACT nasal spray Place 1 spray into both nostrils daily as needed for allergies or rhinitis.    ? Tuberculin-Allergy Syringes (ALLERGY  SYRINGE 1CC/27GX1/2") 27G X 1/2" 1 ML MISC See admin instructions.    ? Cholecalciferol (VITAMIN D3) 1000 units CAPS Take 1 capsule (1,000 Units total) by mouth daily. 30 capsule   ? enalapril (VASOTEC) 20 MG tablet Take 1 tablet by mouth once daily 90 tablet 0  ? metFORMIN (GLUCOPHAGE) 1000 MG tablet TAKE 1 TABLET BY MOUTH TWICE DAILY WITH MEALS 180 tablet 0  ? metoprolol succinate (TOPROL-XL) 50 MG 24 hr tablet Take 1 tablet by mouth once daily 90 tablet 1  ? simvastatin (ZOCOR) 40 MG tablet TAKE 1 TABLET BY MOUTH AT BEDTIME 90 tablet 0  ? ?No facility-administered medications prior to visit.  ?  ? ?Per HPI unless specifically indicated in ROS section below ?Review of Systems   ?Constitutional:  Negative for activity change, appetite change, chills, fatigue, fever and unexpected weight change.  ?HENT:  Negative for hearing loss.   ?Eyes:  Negative for visual disturbance.  ?Respiratory:  Negative for cough, chest tightness, shortness of breath and wheezing.   ?Cardiovascular:  Negative for chest pain, palpitations and leg swelling.  ?Gastrointestinal:  Negative for abdominal distention, abdominal pain, blood in stool, constipation, diarrhea, nausea and vomiting.  ?Genitourinary:  Negative for difficulty urinating and hematuria.  ?Musculoskeletal:  Negative for arthralgias, myalgias and neck pain.  ?Skin:  Negative for rash.  ?Neurological:  Negative for dizziness, seizures, syncope and headaches.  ?Hematological:  Negative for adenopathy. Does not bruise/bleed easily.  ?Psychiatric/Behavioral:  Negative for dysphoric mood. The patient is not nervous/anxious.   ? ?Objective:  ?BP 138/86   Pulse 87   Temp 98.4 ?F (36.9 ?C) (Temporal)   Ht 5' 6.5" (1.689 m)   Wt 230 lb 4 oz (104.4 kg)   SpO2 93%   BMI 36.61 kg/m?   ?Wt Readings from Last 3 Encounters:  ?09/05/21 230 lb 4 oz (104.4 kg)  ?07/23/21 237 lb (107.5 kg)  ?07/05/21 236 lb 12.8 oz (107.4 kg)  ?  ?  ?Physical Exam ?Vitals and nursing note reviewed.  ?Constitutional:   ?   General: He is not in acute distress. ?   Appearance: Normal appearance. He is well-developed. He is not ill-appearing.  ?HENT:  ?   Head: Normocephalic and atraumatic.  ?   Right Ear: Hearing, tympanic membrane, ear canal and external ear normal.  ?   Left Ear: Hearing, tympanic membrane, ear canal and external ear normal.  ?Eyes:  ?   General: No scleral icterus. ?   Extraocular Movements: Extraocular movements intact.  ?   Conjunctiva/sclera: Conjunctivae normal.  ?   Pupils: Pupils are equal, round, and reactive to light.  ?Neck:  ?   Thyroid: No thyroid mass or thyromegaly.  ?   Vascular: No carotid bruit.  ?Cardiovascular:  ?   Rate and Rhythm: Normal  rate. Rhythm irregularly irregular.  ?   Pulses: Normal pulses.     ?     Radial pulses are 2+ on the right side and 2+ on the left side.  ?   Heart sounds: Normal heart sounds. No murmur heard. ?Pulmonary:  ?   Effort: Pulmonary effort is normal. No respiratory distress.  ?   Breath sounds: Normal breath sounds. No wheezing, rhonchi or rales.  ?Abdominal:  ?   General: Bowel sounds are normal. There is no distension.  ?   Palpations: Abdomen is soft. There is no mass.  ?   Tenderness: There is no abdominal tenderness. There is no guarding or rebound.  ?  Hernia: No hernia is present.  ?Musculoskeletal:     ?   General: Normal range of motion.  ?   Cervical back: Normal range of motion and neck supple.  ?   Right lower leg: No edema.  ?   Left lower leg: No edema.  ?Lymphadenopathy:  ?   Cervical: No cervical adenopathy.  ?Skin: ?   General: Skin is warm and dry.  ?   Findings: No rash.  ?Neurological:  ?   General: No focal deficit present.  ?   Mental Status: He is alert and oriented to person, place, and time.  ?   Comments:  ?Recall 3/3 ?Calculation 5/5 DLROW  ?Psychiatric:     ?   Mood and Affect: Mood normal.     ?   Behavior: Behavior normal.     ?   Thought Content: Thought content normal.     ?   Judgment: Judgment normal.  ? ?   ?Results for orders placed or performed in visit on 08/29/21  ?PSA  ?Result Value Ref Range  ? PSA 1.12 0.10 - 4.00 ng/mL  ?VITAMIN D 25 Hydroxy (Vit-D Deficiency, Fractures)  ?Result Value Ref Range  ? VITD 25.37 (L) 30.00 - 100.00 ng/mL  ?CBC with Differential/Platelet  ?Result Value Ref Range  ? WBC 7.7 4.0 - 10.5 K/uL  ? RBC 4.69 4.22 - 5.81 Mil/uL  ? Hemoglobin 13.9 13.0 - 17.0 g/dL  ? HCT 41.4 39.0 - 52.0 %  ? MCV 88.2 78.0 - 100.0 fl  ? MCHC 33.6 30.0 - 36.0 g/dL  ? RDW 14.0 11.5 - 15.5 %  ? Platelets 209.0 150.0 - 400.0 K/uL  ? Neutrophils Relative % 69.5 43.0 - 77.0 %  ? Lymphocytes Relative 20.0 12.0 - 46.0 %  ? Monocytes Relative 7.8 3.0 - 12.0 %  ? Eosinophils Relative  2.2 0.0 - 5.0 %  ? Basophils Relative 0.5 0.0 - 3.0 %  ? Neutro Abs 5.3 1.4 - 7.7 K/uL  ? Lymphs Abs 1.5 0.7 - 4.0 K/uL  ? Monocytes Absolute 0.6 0.1 - 1.0 K/uL  ? Eosinophils Absolute 0.2 0.0 - 0.7 K/uL  ? Basophils Absolute 0.0 0.0

## 2021-09-05 NOTE — Patient Instructions (Addendum)
If interested, check with pharmacy about new 2 shot shingles series (shingrix).  ?Work on Ecologist provided today.  ?Increase vitamin D to 2000 units daily.  ?Price out and try jardiance '10mg'$  daily for kidney protection. If you start developing low sugar symptoms, decrease metformin to 1/2 pill twice daily.  ?Check with pharmacy/insurance on preferred glucometer brand and let me know. Goal fasting sugars are 80-120.  ?We will refer you to dermatologist for skin evaluation.  ?We will schedule abdominal aneurysm screening ultrasound.  ?Return in 3-4 months for diabetes check.  ? ?Health Maintenance After Age 69 ?After age 30, you are at a higher risk for certain long-term diseases and infections as well as injuries from falls. Falls are a major cause of broken bones and head injuries in people who are older than age 9. Getting regular preventive care can help to keep you healthy and well. Preventive care includes getting regular testing and making lifestyle changes as recommended by your health care provider. Talk with your health care provider about: ?Which screenings and tests you should have. A screening is a test that checks for a disease when you have no symptoms. ?A diet and exercise plan that is right for you. ?What should I know about screenings and tests to prevent falls? ?Screening and testing are the best ways to find a health problem early. Early diagnosis and treatment give you the best chance of managing medical conditions that are common after age 33. Certain conditions and lifestyle choices may make you more likely to have a fall. Your health care provider may recommend: ?Regular vision checks. Poor vision and conditions such as cataracts can make you more likely to have a fall. If you wear glasses, make sure to get your prescription updated if your vision changes. ?Medicine review. Work with your health care provider to regularly review all of the medicines you are taking, including  over-the-counter medicines. Ask your health care provider about any side effects that may make you more likely to have a fall. Tell your health care provider if any medicines that you take make you feel dizzy or sleepy. ?Strength and balance checks. Your health care provider may recommend certain tests to check your strength and balance while standing, walking, or changing positions. ?Foot health exam. Foot pain and numbness, as well as not wearing proper footwear, can make you more likely to have a fall. ?Screenings, including: ?Osteoporosis screening. Osteoporosis is a condition that causes the bones to get weaker and break more easily. ?Blood pressure screening. Blood pressure changes and medicines to control blood pressure can make you feel dizzy. ?Depression screening. You may be more likely to have a fall if you have a fear of falling, feel depressed, or feel unable to do activities that you used to do. ?Alcohol use screening. Using too much alcohol can affect your balance and may make you more likely to have a fall. ?Follow these instructions at home: ?Lifestyle ?Do not drink alcohol if: ?Your health care provider tells you not to drink. ?If you drink alcohol: ?Limit how much you have to: ?0-1 drink a day for women. ?0-2 drinks a day for men. ?Know how much alcohol is in your drink. In the [place] one drink equals one 12 oz bottle of beer (355 mL), one 5 oz glass of wine (148 mL), or one 1? oz glass of hard liquor (44 mL). ?Do not use any products that contain nicotine or tobacco. These products include cigarettes, chewing tobacco, and  vaping devices, such as e-cigarettes. If you need help quitting, ask your health care provider. ?Activity ? ?Follow a regular exercise program to stay fit. This will help you maintain your balance. Ask your health care provider what types of exercise are appropriate for you. ?If you need a cane or walker, use it as recommended by your health care provider. ?Wear supportive shoes  that have nonskid soles. ?Safety ? ?Remove any tripping hazards, such as rugs, cords, and clutter. ?Install safety equipment such as grab bars in bathrooms and safety rails on stairs. ?Keep rooms and walkways well-lit. ?General instructions ?Talk with your health care provider about your risks for falling. Tell your health care provider if: ?You fall. Be sure to tell your health care provider about all falls, even ones that seem minor. ?You feel dizzy, tiredness (fatigue), or off-balance. ?Take over-the-counter and prescription medicines only as told by your health care provider. These include supplements. ?Eat a healthy diet and maintain a healthy weight. A healthy diet includes low-fat dairy products, low-fat (lean) meats, and fiber from whole grains, beans, and lots of fruits and vegetables. ?Stay current with your vaccines. ?Schedule regular health, dental, and eye exams. ?Summary ?Having a healthy lifestyle and getting preventive care can help to protect your health and wellness after age 21. ?Screening and testing are the best way to find a health problem early and help you avoid having a fall. Early diagnosis and treatment give you the best chance for managing medical conditions that are more common for people who are older than age 2. ?Falls are a major cause of broken bones and head injuries in people who are older than age 81. Take precautions to prevent a fall at home. ?Work with your health care provider to learn what changes you can make to improve your health and wellness and to prevent falls. ?This information is not intended to replace advice given to you by your health care provider. Make sure you discuss any questions you have with your health care provider. ?Document Revised: 10/16/2020 Document Reviewed: 10/16/2020 ?Elsevier Patient Education ? [place]. ? ?

## 2021-09-05 NOTE — Assessment & Plan Note (Signed)
Advanced directives - does not have set up. Would want wife to be HCPOA. Packet provided today. Full code. Wouldn't want prolonged life support if terminal condition.  ?

## 2021-09-05 NOTE — Assessment & Plan Note (Signed)

## 2021-09-06 DIAGNOSIS — Z136 Encounter for screening for cardiovascular disorders: Secondary | ICD-10-CM | POA: Insufficient documentation

## 2021-09-06 DIAGNOSIS — H5461 Unqualified visual loss, right eye, normal vision left eye: Secondary | ICD-10-CM | POA: Insufficient documentation

## 2021-09-06 NOTE — Assessment & Plan Note (Signed)
2-3 PY hx, quit remotely. ?However does qualify for AAA screen (100cig/lifetime) so will order. ?

## 2021-09-06 NOTE — Assessment & Plan Note (Signed)
GFR stable 60s. Persistent microalbuminuria - will price out jardiance as per above.  ?

## 2021-09-06 NOTE — Assessment & Plan Note (Signed)
rec increase vitamin D3 to 2000 IU daily.  ?

## 2021-09-06 NOTE — Assessment & Plan Note (Addendum)
Failed cardioversion.  ?Now rate controlled with toprol XL and continued eliquis. Appreciate cards care.  ?

## 2021-09-06 NOTE — Assessment & Plan Note (Addendum)
Severe OSA - therefore unsure if will be a candidate for [facility] device. Continued difficulty tolerating CPAP mask. Pending rpt sleep lab evaluation for mask fitting.  ?

## 2021-09-06 NOTE — Assessment & Plan Note (Signed)
Encouraged healthy diet and lifestyle choices to affect sustainable weight loss.  ?

## 2021-09-06 NOTE — Assessment & Plan Note (Signed)
R visual difficulty noted today - he has established with [facility] Dr Kathlen Mody ?

## 2021-09-06 NOTE — Assessment & Plan Note (Signed)
Chronic, good control on simvastatin '40mg'$  - continue. ?The 10-year ASCVD risk score (Arnett DK, et al., 2019) is: 42.6% ?  Values used to calculate the score: ?    Age: 71 years ?    Sex: Male ?    Is Non-Hispanic African American: No ?    Diabetic: Yes ?    Tobacco smoker: No ?    Systolic Blood Pressure: 706 mmHg ?    Is BP treated: Yes ?    HDL Cholesterol: 43.1 mg/dL ?    Total Cholesterol: 148 mg/dL  ?

## 2021-09-06 NOTE — Assessment & Plan Note (Addendum)
Chronic, stable on metformin '1000mg'$  BID.  ?Given microalbuminuria, will price out jardiance. Reviewed side effects to watch for including increased risk of UTI, yeast infection, groin cellulitis.  ?They will also check on preferred insurance brand for glucometer.  ?

## 2021-09-06 NOTE — Assessment & Plan Note (Signed)
Chronic, stable on current regimen.  

## 2021-09-06 NOTE — Assessment & Plan Note (Signed)
Continue statin, consider aspirin however already on eliquis.  ?

## 2021-09-10 ENCOUNTER — Ambulatory Visit (HOSPITAL_BASED_OUTPATIENT_CLINIC_OR_DEPARTMENT_OTHER): Payer: Medicare HMO | Attending: Pulmonary Disease | Admitting: Pulmonary Disease

## 2021-09-10 DIAGNOSIS — H25011 Cortical age-related cataract, right eye: Secondary | ICD-10-CM | POA: Diagnosis not present

## 2021-09-10 DIAGNOSIS — H25041 Posterior subcapsular polar age-related cataract, right eye: Secondary | ICD-10-CM | POA: Diagnosis not present

## 2021-09-10 DIAGNOSIS — H2511 Age-related nuclear cataract, right eye: Secondary | ICD-10-CM | POA: Diagnosis not present

## 2021-09-10 DIAGNOSIS — G4733 Obstructive sleep apnea (adult) (pediatric): Secondary | ICD-10-CM

## 2021-09-11 ENCOUNTER — Other Ambulatory Visit (HOSPITAL_BASED_OUTPATIENT_CLINIC_OR_DEPARTMENT_OTHER): Payer: No Typology Code available for payment source | Admitting: Pulmonary Disease

## 2021-09-18 DIAGNOSIS — H25811 Combined forms of age-related cataract, right eye: Secondary | ICD-10-CM | POA: Diagnosis not present

## 2021-09-18 DIAGNOSIS — H5703 Miosis: Secondary | ICD-10-CM | POA: Diagnosis not present

## 2021-09-18 DIAGNOSIS — H2181 Floppy iris syndrome: Secondary | ICD-10-CM | POA: Diagnosis not present

## 2021-09-18 DIAGNOSIS — H2511 Age-related nuclear cataract, right eye: Secondary | ICD-10-CM | POA: Diagnosis not present

## 2021-09-27 NOTE — Procedures (Signed)
? ? ?  The patient arrived to the [facility] with c/o overall mask discomfort. The patient was fitted with the [place] (Medium) and [place] (Large). The patient tolerated both mask well. Both mask was given to the patient for home use. ? ?Julie Paolini ?Diplomate, Tax adviser of Sleep Medicine ? ?ELECTRONICALLY SIGNED ON:  09/27/2021, 12:07 PM ?[place] ?PH: [phone]   FX: [phone] ?ACCREDITED BY THE AMERICAN [employer] OF SLEEP [employer]ICINE ? ?

## 2021-10-08 ENCOUNTER — Telehealth: Payer: Self-pay | Admitting: Family Medicine

## 2021-10-08 NOTE — Telephone Encounter (Signed)
Pt called and mentioned that he is getting this medication below and would like to speak with the nurse about getting a glucose meter to check his blood sugar. Callback Number: [phone] ? ?Medication: ?empagliflozin (JARDIANCE) 10 MG TABS tablet ?

## 2021-10-08 NOTE — Telephone Encounter (Signed)
Spoke to patient by telephone and advised him to reach out to his [employer] and find out which glucometer they will cover. Patient stated that he will call back with this information. ?

## 2021-10-11 ENCOUNTER — Telehealth: Payer: Self-pay

## 2021-10-11 NOTE — Telephone Encounter (Signed)
Received faxed Physician Order form from [employer] for DM supplies. ? ?Spoke with pt's wife, James Robbins (on dpr), asking if this was initiated by pt.  Confirms it was.  States pt was given a few phn #'s by [facility] to call and pt did. ? ?Placed form in Dr. Synthia Innocent box.  ?

## 2021-10-12 DIAGNOSIS — E1121 Type 2 diabetes mellitus with diabetic nephropathy: Secondary | ICD-10-CM | POA: Diagnosis not present

## 2021-10-12 NOTE — Telephone Encounter (Signed)
Faxed order

## 2021-10-12 NOTE — Telephone Encounter (Signed)
Signed and in Lisa's box ?
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
## 2021-11-26 DIAGNOSIS — J301 Allergic rhinitis due to pollen: Secondary | ICD-10-CM | POA: Diagnosis not present

## 2021-11-27 DIAGNOSIS — J3089 Other allergic rhinitis: Secondary | ICD-10-CM | POA: Diagnosis not present

## 2021-12-19 LAB — HM DIABETES EYE EXAM

## 2021-12-31 ENCOUNTER — Ambulatory Visit (INDEPENDENT_AMBULATORY_CARE_PROVIDER_SITE_OTHER): Payer: Medicare HMO | Admitting: Family Medicine

## 2021-12-31 ENCOUNTER — Encounter: Payer: Self-pay | Admitting: Family Medicine

## 2021-12-31 VITALS — BP 134/90 | HR 94 | Temp 97.6°F | Ht 65.0 in | Wt 221.0 lb

## 2021-12-31 DIAGNOSIS — R809 Proteinuria, unspecified: Secondary | ICD-10-CM | POA: Diagnosis not present

## 2021-12-31 DIAGNOSIS — I4821 Permanent atrial fibrillation: Secondary | ICD-10-CM

## 2021-12-31 DIAGNOSIS — H5461 Unqualified visual loss, right eye, normal vision left eye: Secondary | ICD-10-CM | POA: Diagnosis not present

## 2021-12-31 DIAGNOSIS — E1129 Type 2 diabetes mellitus with other diabetic kidney complication: Secondary | ICD-10-CM

## 2021-12-31 LAB — POCT GLYCOSYLATED HEMOGLOBIN (HGB A1C): Hemoglobin A1C: 6 % — AB (ref 4.0–5.6)

## 2021-12-31 NOTE — Progress Notes (Signed)
Patient ID: James Robbins, male    DOB: [birth year], 71 y.o.   MRN: [id number]  This visit was conducted in person.  BP 134/90   Pulse 94   Temp 97.6 F (36.4 C) (Temporal)   Ht '5\' 5"'$  (1.651 m)   Wt 221 lb (100.2 kg)   SpO2 96%   BMI 36.78 kg/m    CC: DM f/u visit  Subjective:   HPI: James Robbins is a 71 y.o. male presenting on 12/31/2021 for Diabetes (Here for 3-4 mo f/u.)   DM - does regularly check sugars and brings log - fasting 120-200 (more recently 120-140s). Compliant with antihyperglycemic regimen which includes: jardiance '10mg'$  daily, metformin '1000mg'$  bid. He's cut out carbs. Struggling with cost of jardiance (as well as eliquis) high cost meds. Denies low sugars or hypoglycemic symptoms. Denies UTI symptoms, yeast infection or groin infection symptoms. Denies paresthesias, blurry vision. Last diabetic eye exam 09/2021 [name]) - will request records. Glucometer brand: ?accuchek. Last foot exam: 2017 - DUE. DSME: 2017.  Lab Results  Component Value Date   HGBA1C 6.0 (A) 12/31/2021   Diabetic Foot Exam - Simple   Simple Foot Form Diabetic Foot exam was performed with the following findings: Yes 12/31/2021 10:02 AM  Visual Inspection No deformities, no ulcerations, no other skin breakdown bilaterally: Yes Sensation Testing Intact to touch and monofilament testing bilaterally: Yes Pulse Check See comments: Yes Comments 2+ PT, diminished DP bilaterally    Lab Results  Component Value Date   MICROALBUR 91.2 (H) 08/29/2021         Relevant past medical, surgical, family and social history reviewed and updated as indicated. Interim medical history since our last visit reviewed. Allergies and medications reviewed and updated. Outpatient Medications Prior to Visit  Medication Sig Dispense Refill   acetaminophen (TYLENOL) 650 MG CR tablet Take 650 mg by mouth 2 (two) times daily.     cetirizine (ZYRTEC) 10 MG tablet Take 10 mg by mouth daily.     Cholecalciferol  (VITAMIN D3) 25 MCG (1000 UT) CAPS Take 2 capsules (2,000 Units total) by mouth daily.     diclofenac Sodium (VOLTAREN) 1 % GEL Apply 1 application topically 4 (four) times daily as needed (pain).     ELIQUIS 5 MG TABS tablet Take 1 tablet by mouth twice daily 60 tablet 11   empagliflozin (JARDIANCE) 10 MG TABS tablet Take 1 tablet (10 mg total) by mouth daily. 30 tablet 6   enalapril (VASOTEC) 20 MG tablet Take 1 tablet (20 mg total) by mouth daily. 90 tablet 3   fluticasone (FLONASE) 50 MCG/ACT nasal spray Place 1 spray into both nostrils daily as needed for allergies or rhinitis.     metFORMIN (GLUCOPHAGE) 1000 MG tablet Take 1 tablet (1,000 mg total) by mouth 2 (two) times daily with a meal. 180 tablet 3   metoprolol succinate (TOPROL-XL) 50 MG 24 hr tablet Take 1 tablet (50 mg total) by mouth daily. Take with or immediately following a meal. 90 tablet 3   simvastatin (ZOCOR) 40 MG tablet Take 1 tablet (40 mg total) by mouth at bedtime. 90 tablet 3   Tuberculin-Allergy Syringes (ALLERGY SYRINGE 1CC/27GX1/2") 27G X 1/2" 1 ML MISC See admin instructions.     No facility-administered medications prior to visit.     Per HPI unless specifically indicated in ROS section below Review of Systems  Objective:  BP 134/90   Pulse 94   Temp 97.6 F (36.4 C) (Temporal)  Ht '5\' 5"'$  (1.651 m)   Wt 221 lb (100.2 kg)   SpO2 96%   BMI 36.78 kg/m   Wt Readings from Last 3 Encounters:  12/31/21 221 lb (100.2 kg)  09/10/21 230 lb (104.3 kg)  09/05/21 230 lb 4 oz (104.4 kg)      Physical Exam Vitals and nursing note reviewed.  Constitutional:      Appearance: Normal appearance. He is not ill-appearing.  Eyes:     Extraocular Movements: Extraocular movements intact.     Conjunctiva/sclera: Conjunctivae normal.     Pupils: Pupils are equal, round, and reactive to light.  Cardiovascular:     Rate and Rhythm: Normal rate. Rhythm irregularly irregular.     Pulses: Normal pulses.     Heart sounds:  Normal heart sounds. No murmur heard. Pulmonary:     Effort: Pulmonary effort is normal. No respiratory distress.     Breath sounds: Normal breath sounds. No wheezing, rhonchi or rales.  Musculoskeletal:     Right lower leg: No edema.     Left lower leg: No edema.     Comments: See HPI for foot exam if done  Skin:    General: Skin is warm and dry.     Findings: No rash.  Neurological:     Mental Status: He is alert.  Psychiatric:        Mood and Affect: Mood normal.        Behavior: Behavior normal.       Results for orders placed or performed in visit on 12/31/21  POCT glycosylated hemoglobin (Hb A1C)  Result Value Ref Range   Hemoglobin A1C 6.0 (A) 4.0 - 5.6 %   HbA1c POC (<> result, manual entry)     HbA1c, POC (prediabetic range)     HbA1c, POC (controlled diabetic range)      Assessment & Plan:   Problem List Items Addressed This Visit     Severe obesity (BMI 35.0-39.9) with comorbidity ([place])    10 lb weight loss noted since starting jardiance.       Type 2 diabetes mellitus with microalbuminuria, without long-term current use of insulin (HCC) - Primary    Chronic, stable on metformin + jardiance. Will ask pharmacy to see if pt eligible for patient assistance program.  Continue current regimen otherwise.  Pt unable to provide urine sample today -  check next visit      Relevant Orders   POCT glycosylated hemoglobin (Hb A1C) (Completed)   AMB Referral to [facility]   Permanent atrial fibrillation [place])   Relevant Orders   AMB Referral to [place] loss of right eye     No orders of the defined types were placed in this encounter.  Orders Placed This Encounter  Procedures   AMB Referral to [facility]    Referral Priority:   Routine    Referral Type:   Consultation    Referral Reason:   Care Coordination    Number of Visits Requested:   1   POCT glycosylated hemoglobin (Hb A1C)     Patient  Instructions  We will request records from Dr [name] for latest diabetic eye exam.  I will ask pharmacist to contact you about cost of medicines.  Congratulations on blood pressure control!  Let us know if any low sugars or low sugar symptoms.  Continue current medicines.  Return in 4-5 months for diabetes check.  Follow up plan: Return in  about 4 months (around 05/03/2022) for follow up visit.  Ria Bush, MD

## 2021-12-31 NOTE — Assessment & Plan Note (Addendum)
Chronic, stable on metformin + jardiance. Will ask pharmacy to see if pt eligible for patient assistance program.  Continue current regimen otherwise.  Pt unable to provide urine sample today -  check next visit

## 2021-12-31 NOTE — Patient Instructions (Addendum)
We will request records from Dr [name] for latest diabetic eye exam.  I will ask pharmacist to contact you about cost of medicines.  Congratulations on blood pressure control!  Let us know if any low sugars or low sugar symptoms.  Continue current medicines.  Return in 4-5 months for diabetes check.

## 2021-12-31 NOTE — Assessment & Plan Note (Signed)
10 lb weight loss noted since starting jardiance.

## 2022-01-01 ENCOUNTER — Ambulatory Visit (HOSPITAL_COMMUNITY)
Admission: RE | Admit: 2022-01-01 | Discharge: 2022-01-01 | Disposition: A | Discharge status: Home / Self Care | Payer: Medicare HMO | Source: Ambulatory Visit | Attending: Nurse Practitioner | Admitting: Nurse Practitioner

## 2022-01-01 ENCOUNTER — Encounter: Payer: Self-pay | Admitting: Family Medicine

## 2022-01-01 ENCOUNTER — Encounter (HOSPITAL_COMMUNITY): Payer: Self-pay | Admitting: Nurse Practitioner

## 2022-01-01 VITALS — BP 146/74 | HR 89 | Ht 65.0 in | Wt 221.8 lb

## 2022-01-01 DIAGNOSIS — I129 Hypertensive chronic kidney disease with stage 1 through stage 4 chronic kidney disease, or unspecified chronic kidney disease: Secondary | ICD-10-CM | POA: Insufficient documentation

## 2022-01-01 DIAGNOSIS — D6869 Other thrombophilia: Secondary | ICD-10-CM

## 2022-01-01 DIAGNOSIS — E113292 Type 2 diabetes mellitus with mild nonproliferative diabetic retinopathy without macular edema, left eye: Secondary | ICD-10-CM | POA: Insufficient documentation

## 2022-01-01 DIAGNOSIS — N181 Chronic kidney disease, stage 1: Secondary | ICD-10-CM | POA: Diagnosis not present

## 2022-01-01 DIAGNOSIS — I4821 Permanent atrial fibrillation: Secondary | ICD-10-CM | POA: Diagnosis not present

## 2022-01-01 DIAGNOSIS — Z7901 Long term (current) use of anticoagulants: Secondary | ICD-10-CM | POA: Diagnosis not present

## 2022-01-01 DIAGNOSIS — H35033 Hypertensive retinopathy, bilateral: Secondary | ICD-10-CM | POA: Insufficient documentation

## 2022-01-01 DIAGNOSIS — Z7984 Long term (current) use of oral hypoglycemic drugs: Secondary | ICD-10-CM | POA: Insufficient documentation

## 2022-01-01 DIAGNOSIS — E1122 Type 2 diabetes mellitus with diabetic chronic kidney disease: Secondary | ICD-10-CM | POA: Insufficient documentation
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
## 2022-01-03 ENCOUNTER — Telehealth: Payer: Self-pay | Admitting: Pharmacist

## 2022-01-03 NOTE — Progress Notes (Signed)
[place] [facility]) [place]   01/03/2022  James Robbins 03-03-1951 [id number]  Reason for referral: Medication Assistance  Referral source: Medication Assistance Referral medication(s): Jardiance & Eliquis Current insurance:[employer]  HPI: Including but not limited to:  type 2 diabetes, hypertension,osteoarthritis, obesity, hyperlipidemia, sleep apnea, and vitamin D Deficiency.   Objective: No Known Allergies  Medications Reviewed Today     Reviewed by Elayne Guerin, [facility] (Pharmacist) on 01/03/22 at [place] List Status: <None>   Medication Order Taking? Sig Documenting Provider Last Dose Status Informant  acetaminophen (TYLENOL) 650 MG CR tablet [id number] Yes Take 650 mg by mouth 2 (two) times daily. [provider] Taking Active Self  cetirizine (ZYRTEC) 10 MG tablet [id number] Yes Take 10 mg by mouth daily. [provider] Taking Active Self  Cholecalciferol (VITAMIN D3) 25 MCG (1000 UT) CAPS [id number] Yes Take 2 capsules (2,000 Units total) by mouth daily. Ria Bush, MD Taking Active   diclofenac Sodium (VOLTAREN) 1 % GEL [id number] Yes Apply 1 application topically 4 (four) times daily as needed (pain). [provider] Taking Active Self  ELIQUIS 5 MG TABS tablet [id number] Yes Take 1 tablet by mouth twice daily Sherran Needs, NP Taking Active   empagliflozin (JARDIANCE) 10 MG TABS tablet [id number] Yes Take 1 tablet (10 mg total) by mouth daily. Ria Bush, MD Taking Active   enalapril (VASOTEC) 20 MG tablet [id number] Yes Take 1 tablet (20 mg total) by mouth daily. Ria Bush, MD Taking Active   fluticasone [facility]) 50 MCG/ACT nasal spray [id number] Yes Place 1 spray into both nostrils daily as needed for allergies or rhinitis. [provider] Taking Active Self  metFORMIN (GLUCOPHAGE) 1000 MG tablet [id number] Yes Take 1 tablet (1,000 mg total) by mouth 2 (two) times daily with a meal.  Ria Bush, MD Taking Active   metoprolol succinate (TOPROL-XL) 50 MG 24 hr tablet [id number] Yes Take 1 tablet (50 mg total) by mouth daily. Take with or immediately following a meal. Ria Bush, MD Taking Active   simvastatin (ZOCOR) 40 MG tablet [id number] Yes Take 1 tablet (40 mg total) by mouth at bedtime. Ria Bush, MD Taking Active   Tuberculin-Allergy Syringes (ALLERGY SYRINGE 1CC/27GX1/2") 27G X 1/2" 1 ML [place] [id number] Yes See admin instructions. [provider] Taking Active Self             Medication Assistance Findings:  Medication assistance needs identified: Jardiance and Eliquis.  After initial review, patient may qualify for patient assistance through drug manufacturer programs.  He communicated understanding about the necessary financial documentation.     Additional medication assistance options reviewed with patient as warranted:  No other options identified  Plan: I will route patient assistance letter to [place] technician who will coordinate patient assistance program application process for medications listed above.  [facility] pharmacy technician will assist with obtaining all required documents from both patient and provider(s) and submit application(s) once completed.     Elayne Guerin, PharmD, [place] Clinical Pharmacist [phone]

## 2022-01-04 ENCOUNTER — Other Ambulatory Visit (HOSPITAL_COMMUNITY): Payer: Self-pay

## 2022-01-04 MED ORDER — APIXABAN 5 MG PO TABS: 5.0000 mg | ORAL_TABLET | Freq: Two times a day (BID) | ORAL | 0 refills | Status: DC

## 2022-01-15 ENCOUNTER — Telehealth: Payer: Self-pay | Admitting: Pharmacy Technician

## 2022-01-15 DIAGNOSIS — Z596 Low income: Secondary | ICD-10-CM

## 2022-01-15 NOTE — Progress Notes (Signed)
[place] [facility])                                            [place] Team    01/15/2022  AMADOU KATZENSTEIN 05-09-1951 [id number]                                      Medication Assistance Referral  Referral From: [place]  Medication/Company: Vania Rea / [employer] Patient application portion:  Mailed Provider application portion: Faxed  to Dr. Ria Bush Provider address/fax verified via: Office website  Medication/Company: Eliquis / [employer] Patient application portion:  Mailed Provider application portion: Faxed  to Roderic Palau, NP Provider address/fax verified via: Office website    Jayvier Burgher P. Leray Garverick, [place]  [phone]

## 2022-01-16 DIAGNOSIS — E1121 Type 2 diabetes mellitus with diabetic nephropathy: Secondary | ICD-10-CM | POA: Diagnosis not present

## 2022-02-05 ENCOUNTER — Telehealth: Payer: Self-pay | Admitting: Pharmacy Technician

## 2022-02-05 DIAGNOSIS — Z596 Low income: Secondary | ICD-10-CM

## 2022-02-05 NOTE — Progress Notes (Signed)
[facility])                                            [place] Team    02/05/2022  James Robbins Jun 15, 1950 [id number]  Received both patient and provider portion(s) of patient assistance application(s) for Eliquis and Jardiance. Faxed completed application and required documents into [employer] and [employer] respectively.    [name] P. [name], [place]  [phone]

## 2022-02-06 ENCOUNTER — Other Ambulatory Visit: Payer: Self-pay

## 2022-02-06 NOTE — Patient Outreach (Signed)
[facility]) Care Management  02/06/2022  OLUWADAMILARE TOBLER [birth year] [id number]   Telephone Screen    Outreach call to patient to introduce [facility] services and assess care needs as part of benefit of PCP office and insurance plan. No answer. RN CM left HIPAA compliant voicemail message along with contact info.      Plan: Another outreach attempt will be made to patient at another time.   Enzo Montgomery, RN,BSN,CCM [place] Management Telephonic Care Management Coordinator Direct Phone: [phone] Toll Free: [phone] Fax: [phone]

## 2022-02-06 NOTE — Patient Outreach (Signed)
[facility]) Care Management  02/06/2022  James Robbins 12-01-1950 [id number]   Telephone Screen   Incoming call from patient returning RN CM call.    Main healthcare issue/concern today: Patient reports things going well at present. He is already working with [employer] for approval for patient assistance on Jardiance and Eliquis. He denies any issues with transportation. He remains independent with ADLs/IADLs and lives with supportive spouse. Patient reports blood sugars are well controlled and ranging in mid to low 100's. Last A1C 6.0(July 2023).  Health Maintenance/Care Gaps: -Last SHN:GITJLLVDI on 09/05/21 -Vaccine: Patient plans to get influenza shot in Oct  Plan: Patient appreciative of call but denies any RN CM needs or concerns. He is aware that he can call if needs/concerns arise.   Enzo Montgomery, RN,BSN,CCM [place] Management Telephonic Care Management Coordinator Direct Phone: [phone] Toll Free: [phone] Fax: [phone]

## 2022-02-12 ENCOUNTER — Telehealth: Payer: Self-pay | Admitting: Pharmacist

## 2022-02-12 NOTE — Telephone Encounter (Signed)
Received fax from [place] re: Jardiance application. Patient was denied due to to "Does not meet coverage guidelines". No other information given.

## 2022-02-13 ENCOUNTER — Telehealth: Payer: Self-pay | Admitting: Pharmacy Technician

## 2022-02-13 DIAGNOSIS — Z596 Low income: Secondary | ICD-10-CM

## 2022-02-13 NOTE — Progress Notes (Addendum)
[facility])                                            [place] Team    02/13/2022  LEVERNE AMRHEIN Sep 16, 1950 [id number]  Two care coordination calls placed to BI in regard to Jardiance application and BMS in regard to Eliquis application.  Spoke to [place] at [facility] who informs patient is DENIED for Jardiance as patient is over income for the program. She informs based on income submitted patient is at 255% FPL and for Jardiance the cut off point is 250%FPL. Will outreach [facility] PharmD to inquire if she will f/u with provider for a possible switch to another therapeutically equivalent product as other programs have higher cut off points for income.  Spoke to [place] at [facility] who informs patient has temporarily been DENIED for Eliquis as patient has not met the 3% OOP requirement for the program. She informs patient may submit OOP for ALL the members of his household. The OOP report needs to start 06/10/2021 and go to present day. [facility] PharmD will  inform patient of this information when outreaching him about the possibility of applying to another program as stated above.  Ivanell Deshotel P. Beronica Lansdale, [place]  [phone]

## 2022-02-14 ENCOUNTER — Telehealth: Payer: Self-pay | Admitting: Pharmacist

## 2022-02-14 NOTE — Progress Notes (Addendum)
[facility])                                            [place] Team    02/14/2022  James Robbins 17-Jun-1950 [id number]   Patient was referred for medication assistance with Jaridance and Eliquis.  His applications have been submitted. Unfortunately, he was denied for each program for various reasons.  For Eliquis, he had not spent the required 3% of household income on medications.  He only provided spending for himself versus his household but said his family spends very little on medications and did not feel like it would help.    For Jardiance, patient was over income. However, it looks like he would qualify for [place].  If deemed therapeutically appropriate, the patient's provider could switch the patient's therapy to [place] due to financial issues.  Plan: Route note to Patient's Provider to see if the switch to Wilder Glade would be appropriate.  Elayne Guerin, PharmD, [place] Clinical Pharmacist [phone]  ADDENDUM 02/19/2022 Resent note to PCP.  ADDENDUM 02/28/22 Resent note to PCP  Elayne Guerin, PharmD, [place] Clinical Pharmacist [phone]

## 2022-03-11 ENCOUNTER — Telehealth: Payer: Self-pay | Admitting: Family Medicine

## 2022-03-11 NOTE — Telephone Encounter (Signed)
James Robbins, PharmD, BCACP [facility] Clinical Pharmacist called stating the pt did qualify for Wilder Glade, pt is currently on Jaridance through pt assistance. Alwyn Ren was wondering if Dr. Darnell Level would approve the change of meds? Call back # [phone], secured, no ext .

## 2022-03-12 NOTE — Telephone Encounter (Signed)
Noted. Thanks.

## 2022-03-12 NOTE — Telephone Encounter (Signed)
Spoke to [employer] pharmacist and was advised that she will be faxing over paperwork for Dr. Danise Mina to complete and send back. Alwyn Ren stated that he will have the choice of the dose of Wilder Glade that he wants patient to be on. Alwyn Ren stated that all Dr. Danise Mina will need to do is complete paperwork and send back. Alwyn Ren stated that Dr. Danise Mina does not need to send a prescription just update his medication list with the new medication and dose.

## 2022-03-12 NOTE — Telephone Encounter (Signed)
Ok to make change. What do we need to do, where should I send new Rx?  Thanks.

## 2022-03-19 ENCOUNTER — Other Ambulatory Visit: Payer: Self-pay | Admitting: Family Medicine

## 2022-03-19 ENCOUNTER — Telehealth: Payer: Self-pay | Admitting: Pharmacy Technician

## 2022-03-19 DIAGNOSIS — Z596 Low income: Secondary | ICD-10-CM

## 2022-03-19 NOTE — Progress Notes (Signed)
[facility])                                            [place] Team    03/19/2022  LARRIE FRAIZER Oct 31, 1950 [id number]                                      Medication Assistance Referral  Referral From: [place]  Medication/Company: Wilder Glade / AZ&ME Patient application portion:  Mailed Provider application portion: Faxed  to Dr. Ria Bush Provider address/fax verified via: Office website     Bentlie Catanzaro P. Kayshawn Ozburn, [place]  [phone]

## 2022-03-22 DIAGNOSIS — J3089 Other allergic rhinitis: Secondary | ICD-10-CM | POA: Diagnosis not present

## 2022-03-22 DIAGNOSIS — J301 Allergic rhinitis due to pollen: Secondary | ICD-10-CM | POA: Diagnosis not present

## 2022-03-22 DIAGNOSIS — J3081 Allergic rhinitis due to animal (cat) (dog) hair and dander: Secondary | ICD-10-CM | POA: Diagnosis not present

## 2022-04-03 ENCOUNTER — Telehealth: Payer: Self-pay | Admitting: Family Medicine

## 2022-04-03 ENCOUNTER — Telehealth: Payer: Self-pay | Admitting: Pharmacy Technician

## 2022-04-03 DIAGNOSIS — Z596 Low income: Secondary | ICD-10-CM

## 2022-04-03 DIAGNOSIS — Z5986 Financial insecurity: Secondary | ICD-10-CM

## 2022-04-03 NOTE — Progress Notes (Signed)
[facility])                                            [place] Team    04/03/2022  James Robbins June 28, 1950 [id number]  Received both patient and provider portion(s) of patient assistance application(s) for [place]. Faxed completed application and required documents into AZ&ME.   [name] P. [name], [place]  [phone]

## 2022-04-03 NOTE — Telephone Encounter (Signed)
error 

## 2022-04-05 ENCOUNTER — Other Ambulatory Visit: Payer: Self-pay | Admitting: Family Medicine

## 2022-04-05 MED ORDER — DAPAGLIFLOZIN PROPANEDIOL 10 MG PO TABS: 10.0000 mg | ORAL_TABLET | Freq: Every day | ORAL | 11 refills | Status: DC

## 2022-04-05 NOTE — Progress Notes (Signed)
Wilder Glade '10mg'$  sent to local [place] per pt request

## 2022-04-08 ENCOUNTER — Telehealth: Payer: Self-pay | Admitting: Pharmacy Technician

## 2022-04-08 ENCOUNTER — Telehealth: Payer: Self-pay | Admitting: Pharmacist

## 2022-04-08 DIAGNOSIS — Z596 Low income: Secondary | ICD-10-CM

## 2022-04-08 NOTE — Telephone Encounter (Addendum)
Per THN:  "I work with Denyse Amass pharmacist at [facility]. We are trying to apply for [place] with AZ&ME since patient was over income for Jardiance with BI.   We submitted patient's application when we received it on Monday 04/01/22 to AZ&ME.   In the meantime, patient has run out of Jardiance and was calling me to inquire what he should do in the meantime. Informed patient that processing times are 5-7 business days for determinations and suggested he call the office to inquire about samples.   Would it be possible to provide patient samples of either Jardiance of Farxiga depending on what you may have to bridge him until we can determine an application status?"   No samples available, but Wilder Glade does offer a one-time 30-day free trial card to use at pharmacy.  BIN: [id number] PCN: [id number] GRP: [id number] ID: [id number]  Will call pharmacy with free trial info and let patient know.  [place], [place] [address] Phone: [phone] Fax: [phone]

## 2022-04-08 NOTE — Progress Notes (Signed)
[facility])                                            [place] Team    04/08/2022  James Robbins [birth year] [id number]  Care coordination call placed to AZ[employer]ME in regard to [facility] application.  Spoke to [name] who informs patient is APPROVED 04/05/22-06/10/23. Medication will be delivered to the patient's home in the next 10-14 business days. Patient is aware of his approval.  [name] P. [name], [name]  [phone]

## 2022-04-08 NOTE — Telephone Encounter (Signed)
Spoke with [facility]. Patient picked up Farxiga 30ds on 04/05/2022 and paid out of pocket. Free trial card will be on file.  Charlene Brooke, CPP notified  Marijean Niemann, [place] Clinical Pharmacy Assistant [phone]

## 2022-04-15 DIAGNOSIS — D3131 Benign neoplasm of right choroid: Secondary | ICD-10-CM | POA: Diagnosis not present

## 2022-04-15 DIAGNOSIS — H40023 Open angle with borderline findings, high risk, bilateral: Secondary | ICD-10-CM | POA: Diagnosis not present

## 2022-04-15 LAB — HM DIABETES EYE EXAM

## 2022-04-17 ENCOUNTER — Encounter: Payer: Self-pay | Admitting: Family Medicine

## 2022-04-18 DIAGNOSIS — E1121 Type 2 diabetes mellitus with diabetic nephropathy: Secondary | ICD-10-CM | POA: Diagnosis not present

## 2022-05-06 ENCOUNTER — Ambulatory Visit (INDEPENDENT_AMBULATORY_CARE_PROVIDER_SITE_OTHER): Payer: Medicare HMO | Admitting: Family Medicine

## 2022-05-06 ENCOUNTER — Encounter: Payer: Self-pay | Admitting: Family Medicine

## 2022-05-06 VITALS — BP 166/96 | HR 82 | Temp 97.6°F | Ht 65.0 in | Wt 222.0 lb

## 2022-05-06 DIAGNOSIS — G4733 Obstructive sleep apnea (adult) (pediatric): Secondary | ICD-10-CM

## 2022-05-06 DIAGNOSIS — R809 Proteinuria, unspecified: Secondary | ICD-10-CM | POA: Diagnosis not present

## 2022-05-06 DIAGNOSIS — I10 Essential (primary) hypertension: Secondary | ICD-10-CM | POA: Diagnosis not present

## 2022-05-06 DIAGNOSIS — I4821 Permanent atrial fibrillation: Secondary | ICD-10-CM

## 2022-05-06 DIAGNOSIS — E1129 Type 2 diabetes mellitus with other diabetic kidney complication: Secondary | ICD-10-CM | POA: Diagnosis not present

## 2022-05-06 DIAGNOSIS — E113292 Type 2 diabetes mellitus with mild nonproliferative diabetic retinopathy without macular edema, left eye: Secondary | ICD-10-CM

## 2022-05-06 LAB — RENAL FUNCTION PANEL
Albumin: 4.3 g/dL (ref 3.5–5.2)
BUN: 22 mg/dL (ref 6–23)
CO2: 28 mEq/L (ref 19–32)
Calcium: 9.8 mg/dL (ref 8.4–10.5)
Chloride: 103 mEq/L (ref 96–112)
Creatinine, Ser: 1.05 mg/dL (ref 0.40–1.50)
GFR: 70.51 mL/min (ref >60.00)
Glucose, Bld: 115 mg/dL — ABNORMAL HIGH (ref 70–99)
Phosphorus: 3.5 mg/dL (ref 2.3–4.6)
Potassium: 4.1 mEq/L (ref 3.5–5.1)
Sodium: 136 mEq/L (ref 135–145)

## 2022-05-06 LAB — POCT GLYCOSYLATED HEMOGLOBIN (HGB A1C): Hemoglobin A1C: 5.7 % — AB (ref 4.0–5.6)

## 2022-05-06 MED ORDER — AMLODIPINE BESYLATE 5 MG PO TABS: 5.0000 mg | ORAL_TABLET | Freq: Every day | ORAL | 6 refills | Status: DC

## 2022-05-06 NOTE — Assessment & Plan Note (Signed)
This is followed by [facility] eye clinic. Appreciate eye doctor care.

## 2022-05-06 NOTE — Assessment & Plan Note (Addendum)
Chronic, great control on metformin + Farxiga (through PAP). Continue current regimen.  Update renal panel.

## 2022-05-06 NOTE — Assessment & Plan Note (Signed)
Severe OSA - continues CPAP.

## 2022-05-06 NOTE — Assessment & Plan Note (Signed)
Remains in afib. Continue toprol XL and eliquis.

## 2022-05-06 NOTE — Assessment & Plan Note (Signed)
Chronic, deteriorated, unclear cause.  He is regularly taking enalapril '20mg'$  and toprol XL '50mg'$  daily, and recently added farxiga.  Will add amlodipine '5mg'$  daily, advised monitoring for pedal edema.  Update if BP staying >140/90.

## 2022-05-06 NOTE — Assessment & Plan Note (Signed)
Obesity complicated by comorbidities of HTN, HLD, T2DM, osteoarthritis, OSA.

## 2022-05-06 NOTE — Progress Notes (Signed)
Patient ID: James Robbins, male    DOB: April 03, 1951, 71 y.o.   MRN: [id number]  This visit was conducted in person.  BP (!) 166/96 (BP Location: Right Arm, Cuff Size: Large)   Pulse 82   Temp 97.6 F (36.4 C) (Skin)   Ht '5\' 5"'$  (1.651 m)   Wt 222 lb (100.7 kg)   BMI 36.94 kg/m   BP Readings from Last 3 Encounters:  05/06/22 (!) 166/96  01/01/22 (!) 146/74  12/31/21 134/90   CC: DM f/u visit  Subjective:   HPI: James Robbins is a 71 y.o. male presenting on 05/06/2022 for Follow-up (On diabetes)   Sees allergist Dr Orvil Feil for allergic rhinitis. Completed bilateral cataract surgery 2022.   DM - does not regularly check sugars. Compliant with antihyperglycemic regimen which includes: farxiga '10mg'$  (new addition, via AZ&Me PAP through [facility] assistance, approved through 05/2023), metformin '1000mg'$  BID. No UTI or yeast infection symptoms or groin cellulitis. Denies low sugars or hypoglycemic symptoms. Denies paresthesias, blurry vision. Last diabetic eye exam 04/2022. Glucometer brand: Prodigy. Last foot exam: 12/2021. DSME: completed 2017. Lab Results  Component Value Date   HGBA1C 5.7 (A) 05/06/2022   Diabetic Foot Exam - Simple   No data filed    Lab Results  Component Value Date   MICROALBUR 91.2 (H) 08/29/2021     HTN - Compliant with current antihypertensive regimen of farxiga '10mg'$  daily, toprol XL '50mg'$  daily, enalapril '20mg'$  daily. Has not recently checked blood pressures at home. No low blood pressure readings or symptoms of dizziness/syncope. Denies HA, vision changes, CP/tightness, SOB, leg swelling.    Struggling with knee pain due to arthritis. He takes tylenol '650mg'$  for arthritis pain - '1300mg'$  once daily in am.      Relevant past medical, surgical, family and social history reviewed and updated as indicated. Interim medical history since our last visit reviewed. Allergies and medications reviewed and updated. Outpatient Medications Prior to Visit  Medication Sig  Dispense Refill   acetaminophen (TYLENOL) 650 MG CR tablet Take 650 mg by mouth 2 (two) times daily.     apixaban (ELIQUIS) 5 MG TABS tablet Take 1 tablet (5 mg total) by mouth 2 (two) times daily. 56 tablet 0   cetirizine (ZYRTEC) 10 MG tablet Take 10 mg by mouth daily.     Cholecalciferol (VITAMIN D3) 25 MCG (1000 UT) CAPS Take 2 capsules (2,000 Units total) by mouth daily.     dapagliflozin propanediol (FARXIGA) 10 MG TABS tablet Take 1 tablet (10 mg total) by mouth daily before breakfast. 30 tablet 11   diclofenac Sodium (VOLTAREN) 1 % GEL Apply 1 application topically 4 (four) times daily as needed (pain).     enalapril (VASOTEC) 20 MG tablet Take 1 tablet (20 mg total) by mouth daily. 90 tablet 3   fluticasone (FLONASE) 50 MCG/ACT nasal spray Place 1 spray into both nostrils daily as needed for allergies or rhinitis.     metFORMIN (GLUCOPHAGE) 1000 MG tablet Take 1 tablet (1,000 mg total) by mouth 2 (two) times daily with a meal. 180 tablet 3   metoprolol succinate (TOPROL-XL) 50 MG 24 hr tablet Take 1 tablet (50 mg total) by mouth daily. Take with or immediately following a meal. 90 tablet 3   simvastatin (ZOCOR) 40 MG tablet Take 1 tablet (40 mg total) by mouth at bedtime. 90 tablet 3   Tuberculin-Allergy Syringes (ALLERGY SYRINGE 1CC/27GX1/2") 27G X 1/2" 1 ML MISC See admin instructions.  No facility-administered medications prior to visit.     Per HPI unless specifically indicated in ROS section below Review of Systems  Objective:  BP (!) 166/96 (BP Location: Right Arm, Cuff Size: Large)   Pulse 82   Temp 97.6 F (36.4 C) (Skin)   Ht '5\' 5"'$  (1.651 m)   Wt 222 lb (100.7 kg)   BMI 36.94 kg/m   Wt Readings from Last 3 Encounters:  05/06/22 222 lb (100.7 kg)  01/01/22 221 lb 12.8 oz (100.6 kg)  12/31/21 221 lb (100.2 kg)      Physical Exam Vitals and nursing note reviewed.  Constitutional:      Appearance: Normal appearance. He is not ill-appearing.  HENT:      Mouth/Throat:     Mouth: Mucous membranes are moist.     Pharynx: Oropharynx is clear. No oropharyngeal exudate or posterior oropharyngeal erythema.  Eyes:     Extraocular Movements: Extraocular movements intact.     Conjunctiva/sclera: Conjunctivae normal.     Pupils: Pupils are equal, round, and reactive to light.  Cardiovascular:     Rate and Rhythm: Normal rate. Rhythm irregularly irregular.     Pulses: Normal pulses.     Heart sounds: Normal heart sounds. No murmur heard. Pulmonary:     Effort: Pulmonary effort is normal. No respiratory distress.     Breath sounds: Normal breath sounds. No wheezing, rhonchi or rales.  Musculoskeletal:     Right lower leg: No edema.     Left lower leg: No edema.     Comments: See HPI for foot exam if done  Skin:    General: Skin is warm and dry.     Findings: No rash.  Neurological:     Mental Status: He is alert.  Psychiatric:        Mood and Affect: Mood normal.        Behavior: Behavior normal.       Results for orders placed or performed in visit on 05/06/22  POCT glycosylated hemoglobin (Hb A1C)  Result Value Ref Range   Hemoglobin A1C 5.7 (A) 4.0 - 5.6 %   HbA1c POC (<> result, manual entry)     HbA1c, POC (prediabetic range)     HbA1c, POC (controlled diabetic range)      Assessment & Plan:   Problem List Items Addressed This Visit       Unprioritized   Hypertension    Chronic, deteriorated, unclear cause.  He is regularly taking enalapril '20mg'$  and toprol XL '50mg'$  daily, and recently added farxiga.  Will add amlodipine '5mg'$  daily, advised monitoring for pedal edema.  Update if BP staying >140/90.       Relevant Medications   amLODipine (NORVASC) 5 MG tablet   Severe obesity (BMI 35.0-39.9) with comorbidity ([place])    Obesity complicated by comorbidities of HTN, HLD, T2DM, osteoarthritis, OSA.       Type 2 diabetes mellitus with microalbuminuria, without long-term current use of insulin (HCC) - Primary    Chronic, great  control on metformin + Farxiga (through PAP). Continue current regimen.  Update renal panel.       Relevant Orders   POCT glycosylated hemoglobin (Hb A1C) (Completed)   Renal function panel   Obstructive sleep apnea hypopnea, severe    Severe OSA - continues CPAP.       Permanent atrial fibrillation (HCC)    Remains in afib. Continue toprol XL and eliquis.       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 Appreciate eye doctor care.         Meds ordered this encounter  Medications   amLODipine (NORVASC) 5 MG tablet    Sig: Take 1 tablet (5 mg total) by mouth daily.    Dispense:  30 tablet    Refill:  6   Orders Placed This Encounter  Procedures   Renal function panel   POCT glycosylated hemoglobin (Hb A1C)     Patient Instructions  Labs today Blood pressure is staying too high - start amlodipine '5mg'$  in am in addition to metoprolol and enalapril. Start monitoring blood pressures at home, let me know if consistently >140/90.  Return as needed or in 4-5 months for physical/wellness visit.   Follow up plan: Return in about 4 months (around 09/06/2022) for annual exam, prior fasting for blood work, medicare wellness visit.  Ria Bush, MD

## 2022-05-06 NOTE — Patient Instructions (Addendum)
Labs today Blood pressure is staying too high - start amlodipine '5mg'$  in am in addition to metoprolol and enalapril. Start monitoring blood pressures at home, let me know if consistently >140/90.  Return as needed or in 4-5 months for physical/wellness visit.

## 2022-05-15 DIAGNOSIS — H40023 Open angle with borderline findings, high risk, bilateral: Secondary | ICD-10-CM | POA: Diagnosis not present

## 2022-05-15 LAB — HM DIABETES EYE EXAM

## 2022-05-17 ENCOUNTER — Encounter: Payer: Self-pay | Admitting: Family Medicine

## 2022-05-22 ENCOUNTER — Ambulatory Visit: Payer: Medicare HMO | Admitting: Dermatology

## 2022-05-28 ENCOUNTER — Ambulatory Visit: Payer: Medicare HMO | Admitting: Dermatology

## 2022-07-08 DIAGNOSIS — J301 Allergic rhinitis due to pollen: Secondary | ICD-10-CM | POA: Diagnosis not present

## 2022-07-08 DIAGNOSIS — J3089 Other allergic rhinitis: Secondary | ICD-10-CM | POA: Diagnosis not present

## 2022-07-18 ENCOUNTER — Encounter (HOSPITAL_COMMUNITY): Payer: Self-pay | Admitting: *Deleted

## 2022-07-19 DIAGNOSIS — E1121 Type 2 diabetes mellitus with diabetic nephropathy: Secondary | ICD-10-CM | POA: Diagnosis not present

## 2022-08-14 ENCOUNTER — Telehealth: Payer: Self-pay | Admitting: Family Medicine

## 2022-08-14 NOTE — Telephone Encounter (Signed)
Contacted James Robbins to schedule their annual wellness visit. Appointment made for 09/19/2022.  [place] Direct Dial: [phone]

## 2022-08-16 ENCOUNTER — Other Ambulatory Visit (HOSPITAL_COMMUNITY): Payer: Self-pay | Admitting: Nurse Practitioner

## 2022-08-25 ENCOUNTER — Other Ambulatory Visit: Payer: Self-pay | Admitting: Family Medicine

## 2022-08-25 DIAGNOSIS — I10 Essential (primary) hypertension: Secondary | ICD-10-CM

## 2022-09-19 ENCOUNTER — Ambulatory Visit (INDEPENDENT_AMBULATORY_CARE_PROVIDER_SITE_OTHER): Payer: Medicare HMO

## 2022-09-19 VITALS — Ht 67.0 in | Wt 222.0 lb

## 2022-09-19 DIAGNOSIS — Z0000 Encounter for general adult medical examination without abnormal findings: Secondary | ICD-10-CM

## 2022-09-19 NOTE — Patient Instructions (Signed)
James Robbins , Thank you for taking time to come for your Medicare Wellness Visit. I appreciate your ongoing commitment to your health goals. Please review the following plan we discussed and let me know if I can assist you in the future.   These are the goals we discussed:  Goals      Patient Stated     Get knees replaced.        This is a list of the screening recommended for you and due dates:  Health Maintenance  Topic Date Due   DTaP/Tdap/Td vaccine (1 - Tdap) Never done   Zoster (Shingles) Vaccine (2 of 2) 11/30/2021   COVID-19 Vaccine (5 - 2023-24 season) 02/08/2022   Yearly kidney health urinalysis for diabetes  08/30/2022   Hemoglobin A1C  11/04/2022   Complete foot exam   01/01/2023   Flu Shot  01/09/2023   Yearly kidney function blood test for diabetes  05/07/2023   Eye exam for diabetics  05/16/2023   Medicare Annual Wellness Visit  09/19/2023   Cologuard (Stool DNA test)  08/27/2024   Pneumonia Vaccine  Completed   Hepatitis C Screening: USPSTF Recommendation to screen - Ages 42-79 yo.  Completed   HPV Vaccine  Aged Out    Advanced directives: Please bring a copy of your health care power of attorney and living will to the office to be added to your chart at your convenience.   Conditions/risks identified: Aim for 30 minutes of exercise or brisk walking, 6-8 glasses of water, and 5 servings of fruits and vegetables each day.   Next appointment: Follow up in one year for your annual wellness visit. 09/23/23 @ 8:15 televisit  Preventive Care 65 Years and Older, Male  Preventive care refers to lifestyle choices and visits with your health care provider that can promote health and wellness. What does preventive care include? A yearly physical exam. This is also called an annual well check. Dental exams once or twice a year. Routine eye exams. Ask your health care provider how often you should have your eyes checked. Personal lifestyle choices, including: Daily care  of your teeth and gums. Regular physical activity. Eating a healthy diet. Avoiding tobacco and drug use. Limiting alcohol use. Practicing safe sex. Taking low doses of aspirin every day. Taking vitamin and mineral supplements as recommended by your health care provider. What happens during an annual well check? The services and screenings done by your health care provider during your annual well check will depend on your age, overall health, lifestyle risk factors, and family history of disease. Counseling  Your health care provider may ask you questions about your: Alcohol use. Tobacco use. Drug use. Emotional well-being. Home and relationship well-being. Sexual activity. Eating habits. History of falls. Memory and ability to understand (cognition). Work and work Astronomer. Screening  You may have the following tests or measurements: Height, weight, and BMI. Blood pressure. Lipid and cholesterol levels. These may be checked every 5 years, or more frequently if you are over 79 years old. Skin check. Lung cancer screening. You may have this screening every year starting at age 25 if you have a 30-pack-year history of smoking and currently smoke or have quit within the past 15 years. Fecal occult blood test (FOBT) of the stool. You may have this test every year starting at age 85. Flexible sigmoidoscopy or colonoscopy. You may have a sigmoidoscopy every 5 years or a colonoscopy every 10 years starting at age 6. Prostate cancer screening.  Recommendations will vary depending on your family history and other risks. Hepatitis C blood test. Hepatitis B blood test. Sexually transmitted disease (STD) testing. Diabetes screening. This is done by checking your blood sugar (glucose) after you have not eaten for a while (fasting). You may have this done every 1-3 years. Abdominal aortic aneurysm (AAA) screening. You may need this if you are a current or former smoker. Osteoporosis. You may  be screened starting at age 32 if you are at high risk. Talk with your health care provider about your test results, treatment options, and if necessary, the need for more tests. Vaccines  Your health care provider may recommend certain vaccines, such as: Influenza vaccine. This is recommended every year. Tetanus, diphtheria, and acellular pertussis (Tdap, Td) vaccine. You may need a Td booster every 10 years. Zoster vaccine. You may need this after age 60. Pneumococcal 13-valent conjugate (PCV13) vaccine. One dose is recommended after age 4. Pneumococcal polysaccharide (PPSV23) vaccine. One dose is recommended after age 22. Talk to your health care provider about which screenings and vaccines you need and how often you need them. This information is not intended to replace advice given to you by your health care provider. Make sure you discuss any questions you have with your health care provider. Document Released: 06/23/2015 Document Revised: 02/14/2016 Document Reviewed: 03/28/2015 Elsevier Interactive Patient Education  2017 [place] Prevention in the Home Falls can cause injuries. They can happen to people of all ages. There are many things you can do to make your home safe and to help prevent falls. What can I do on the outside of my home? Regularly fix the edges of walkways and driveways and fix any cracks. Remove anything that might make you trip as you walk through a door, such as a raised step or threshold. Trim any bushes or trees on the path to your home. Use bright outdoor lighting. Clear any walking paths of anything that might make someone trip, such as rocks or tools. Regularly check to see if handrails are loose or broken. Make sure that both sides of any steps have handrails. Any raised decks and porches should have guardrails on the edges. Have any leaves, snow, or ice cleared regularly. Use sand or salt on walking paths during winter. Clean up any spills in  your garage right away. This includes oil or grease spills. What can I do in the bathroom? Use night lights. Install grab bars by the toilet and in the tub and shower. Do not use towel bars as grab bars. Use non-skid mats or decals in the tub or shower. If you need to sit down in the shower, use a plastic, non-slip stool. Keep the floor dry. Clean up any water that spills on the floor as soon as it happens. Remove soap buildup in the tub or shower regularly. Attach bath mats securely with double-sided non-slip rug tape. Do not have throw rugs and other things on the floor that can make you trip. What can I do in the bedroom? Use night lights. Make sure that you have a light by your bed that is easy to reach. Do not use any sheets or blankets that are too big for your bed. They should not hang down onto the floor. Have a firm chair that has side arms. You can use this for support while you get dressed. Do not have throw rugs and other things on the floor that can make you trip. What can I  do in the kitchen? Clean up any spills right away. Avoid walking on wet floors. Keep items that you use a lot in easy-to-reach places. If you need to reach something above you, use a strong step stool that has a grab bar. Keep electrical cords out of the way. Do not use floor polish or wax that makes floors slippery. If you must use wax, use non-skid floor wax. Do not have throw rugs and other things on the floor that can make you trip. What can I do with my stairs? Do not leave any items on the stairs. Make sure that there are handrails on both sides of the stairs and use them. Fix handrails that are broken or loose. Make sure that handrails are as long as the stairways. Check any carpeting to make sure that it is firmly attached to the stairs. Fix any carpet that is loose or worn. Avoid having throw rugs at the top or bottom of the stairs. If you do have throw rugs, attach them to the floor with carpet  tape. Make sure that you have a light switch at the top of the stairs and the bottom of the stairs. If you do not have them, ask someone to add them for you. What else can I do to help prevent falls? Wear shoes that: Do not have high heels. Have rubber bottoms. Are comfortable and fit you well. Are closed at the toe. Do not wear sandals. If you use a stepladder: Make sure that it is fully opened. Do not climb a closed stepladder. Make sure that both sides of the stepladder are locked into place. Ask someone to hold it for you, if possible. Clearly mark and make sure that you can see: Any grab bars or handrails. First and last steps. Where the edge of each step is. Use tools that help you move around (mobility aids) if they are needed. These include: Canes. Walkers. Scooters. Crutches. Turn on the lights when you go into a dark area. Replace any light bulbs as soon as they burn out. Set up your furniture so you have a clear path. Avoid moving your furniture around. If any of your floors are uneven, fix them. If there are any pets around you, be aware of where they are. Review your medicines with your doctor. Some medicines can make you feel dizzy. This can increase your chance of falling. Ask your doctor what other things that you can do to help prevent falls. This information is not intended to replace advice given to you by your health care provider. Make sure you discuss any questions you have with your health care provider. Document Released: 03/23/2009 Document Revised: 11/02/2015 Document Reviewed: 07/01/2014 Elsevier Interactive Patient Education  2017 [employer].

## 2022-09-19 NOTE — Progress Notes (Signed)
I connected with  James Robbins. on 09/19/22 by a audio enabled telemedicine application and verified that I am speaking with the correct person using two identifiers.  Patient Location: Home  Provider Location: Office/Clinic  I discussed the limitations of evaluation and management by telemedicine. The patient expressed understanding and agreed to proceed.  Subjective:   James Robbins. is a 72 y.o. male who presents for Medicare Annual/Subsequent preventive examination.  Review of Systems      Cardiac Risk Factors include: advanced age (>1men, >45 women);hypertension;diabetes mellitus;male gender     Objective:    Today's Vitals   09/19/22 0836  Weight: 222 lb (100.7 kg)  Height: 5\' 7"  (1.702 m)   Body mass index is 34.77 kg/m.     09/19/2022    8:51 AM 11/27/2020    9:03 AM 10/27/2020    8:12 PM  Advanced Directives  Does Patient Have a Medical Advance Directive? No No No  Would patient like information on creating a medical advance directive? No - Patient declined No - Patient declined No - Patient declined    Current Medications (verified) Outpatient Encounter Medications as of 09/19/2022  Medication Sig   acetaminophen (TYLENOL) 650 MG CR tablet Take 650 mg by mouth 2 (two) times daily.   amLODipine (NORVASC) 5 MG tablet Take 1 tablet (5 mg total) by mouth daily.   apixaban (ELIQUIS) 5 MG TABS tablet Take 1 tablet by mouth twice daily   cetirizine (ZYRTEC) 10 MG tablet Take 10 mg by mouth daily.   Cholecalciferol (VITAMIN D3) 25 MCG (1000 UT) CAPS Take 2 capsules (2,000 Units total) by mouth daily.   dapagliflozin propanediol (FARXIGA) 10 MG TABS tablet Take 1 tablet (10 mg total) by mouth daily before breakfast.   diclofenac Sodium (VOLTAREN) 1 % GEL Apply 1 application topically 4 (four) times daily as needed (pain).   enalapril (VASOTEC) 20 MG tablet Take 1 tablet (20 mg total) by mouth daily.   fluticasone (FLONASE) 50 MCG/ACT nasal spray Place 1 spray  into both nostrils daily as needed for allergies or rhinitis.   metFORMIN (GLUCOPHAGE) 1000 MG tablet Take 1 tablet (1,000 mg total) by mouth 2 (two) times daily with a meal.   metoprolol succinate (TOPROL-XL) 50 MG 24 hr tablet TAKE 1 TABLET BY MOUTH ONCE DAILY. TAKE WITH OR IMMEDIATELY FOLLOWING A MEAL   simvastatin (ZOCOR) 40 MG tablet Take 1 tablet (40 mg total) by mouth at bedtime.   Tuberculin-Allergy Syringes (ALLERGY SYRINGE 1CC/27GX1/2") 27G X 1/2" 1 ML MISC See admin instructions.   No facility-administered encounter medications on file as of 09/19/2022.    Allergies (verified) Patient has no known allergies.   History: Past Medical History:  Diagnosis Date   Atrial fibrillation    Chronic kidney disease (CKD), stage I    proteinuria   Controlled type 2 diabetes mellitus with diabetic nephropathy    Completed DSME 2017   Hematuria 2014   s/p normal urological workup ([facility])   Hyperlipidemia    Hypertension    Obesity    Seasonal allergic rhinitis    Past Surgical History:  Procedure Laterality Date   CARDIOVERSION N/A 11/27/2020   Procedure: CARDIOVERSION;  Surgeon: Elease Hashimoto Deloris Ping, MD;  Location[facility] END[facility]COPY;  Service: Cardiovascular;  Laterality: N/A;   CATARACT EXTRACTION Left 08/2021   pending R   COLONOSCOPY  06/10/2004   per pt normal ([facility])   VASECTOMY     VASECTOMY REVERSAL  Family History  Problem Relation Age of Onset   Diabetes Mother    Hypertension Mother    Hyperlipidemia Mother    CAD Father        73v CABG, smoker   Osteoporosis Sister    Cancer Neg Hx    Social History   Socioeconomic History   Marital status: Married    Spouse name: Not on file   Number of children: 2   Years of education: 14   Highest education level: Not on file  Occupational History   Occupation: retired    Associate Professor: UNEMPLOYED    Comment: 2006  Tobacco Use   Smoking status: Former    Types: Cigarettes    Quit date: 06/09/1986    Years since quitting:  36.3   Smokeless tobacco: Never  Substance and Sexual Activity   Alcohol use: No    Alcohol/week: 0.0 standard drinks of alcohol   Drug use: No   Sexual activity: Not on file  Other Topics Concern   Not on file  Social History Narrative   Lives with wife and children, no pets   Occupation: retired Forensic scientist   Edu: HS   Activity: no regular exercise   Diet: good water, fruits/vegetables daily, stopped sodas   Social Determinants of Health   Financial Resource Strain: Low Risk  (09/19/2022)   Overall Financial Resource Strain (CARDIA)    Difficulty of Paying Living Expenses: Not hard at all  Food Insecurity: No Food Insecurity (09/19/2022)   Hunger Vital Sign    Worried About Running Out of Food in the Last Year: Never true    Ran Out of Food in the Last Year: Never true  Transportation Needs: No Transportation Needs (09/19/2022)   PRAPARE - Administrator, Civil Service (Medical): No    Lack of Transportation (Non-Medical): No  Physical Activity: Inactive (09/19/2022)   Exercise Vital Sign    Days of Exercise per Week: 0 days    Minutes of Exercise per Session: 0 min  Stress: No Stress Concern Present (09/19/2022)   [employer] of Occupational Health - Occupational Stress Questionnaire    Feeling of Stress : Not at all  Social Connections: Moderately Integrated (09/19/2022)   Social Connection and Isolation Panel [NHANES]    Frequency of Communication with Friends and Family: More than three times a week    Frequency of Social Gatherings with Friends and Family: More than three times a week    Attends Religious Services: More than 4 times per year    Active Member of [employer] or Organizations: No    Attends Engineer, structural: Never    Marital Status: Married    Tobacco Counseling Counseling given: Not Answered   Clinical Intake:  Pre-visit preparation completed: Yes  Pain : No/denies pain     Nutritional Risks: None Diabetes: Yes CBG  done?: No Did pt. bring in CBG monitor from home?: No  How often do you need to have someone help you when you read instructions, pamphlets, or other written materials from your doctor or pharmacy?: 1 - Never  Diabetic?Nutrition Risk Assessment:  Has the patient had any N/V/D within the last 2 months?  No  Does the patient have any non-healing wounds?  No  Has the patient had any unintentional weight loss or weight gain?  No   Diabetes:  Is the patient diabetic?  Yes  If diabetic, was a CBG obtained today?  No  Did the patient bring in  their glucometer from home?  No  How often do you monitor your CBG's? Pt does not check.   Financial Strains and Diabetes Management:  Are you having any financial strains with the device, your supplies or your medication? No .  Does the patient want to be seen by Chronic Care Management for management of their diabetes?  No  Would the patient like to be referred to a Nutritionist or for Diabetic Management?  No   Diabetic Exams:  Diabetic Eye Exam: Completed 05/15/22 Dr.[facility] Diabetic Foot Exam: Completed 12/31/21 PCP    Interpreter Needed?: No  Information entered by :: James Watlington LPN   Activities of Daily Living    09/19/2022    8:52 AM  In your present state of health, do you have any difficulty performing the following activities:  Hearing? 0  Vision? 0  Difficulty concentrating or making decisions? 1  Comment forgets occasionally  Walking or climbing stairs? 0  Dressing or bathing? 0  Doing errands, shopping? 0  Preparing Food and eating ? N  Using the Toilet? N  In the past six months, have you accidently leaked urine? N  Do you have problems with loss of bowel control? N  Managing your Medications? N  Managing your Finances? N  Housekeeping or managing your Housekeeping? N    Patient Care Team: Eustaquio Boyden, MD as PCP - General (Family Medicine)  Indicate any recent Medical Services you may have received from  other than [facility] providers in the past year (date may be approximate).     Assessment:   This is a routine wellness examination for James Robbins.  Hearing/Vision screen Hearing Screening - Comments:: No aids Vision Screening - Comments:: No glasses - [facility]  Dietary issues and exercise activities discussed: Current Exercise Habits: The patient does not participate in regular exercise at present, Exercise limited by: None identified   Goals Addressed             This Visit's Progress    Patient Stated       Get knees replaced.       Depression Screen    09/19/2022    8:51 AM 09/05/2021   11:50 AM 04/26/2020   10:25 AM 04/23/2019    9:38 AM 04/15/2018   11:43 AM 07/20/2015   11:33 AM  PHQ 2/9 Scores  PHQ - 2 Score 0 0 0 0 0 0    Fall Risk    09/19/2022    8:44 AM 09/05/2021   11:50 AM 07/20/2015   11:33 AM  Fall Risk   Falls in the past year? 0 0 No  Number falls in past yr: 0    Injury with Fall? 0    Risk for fall due to : No Fall Risks    Follow up Falls prevention discussed;Falls evaluation completed      FALL RISK PREVENTION PERTAINING TO THE HOME:  Any stairs in or around the home? Yes  If so, are there any without handrails? No  Home free of loose throw rugs in walkways, pet beds, electrical cords, etc? Yes  Adequate lighting in your home to reduce risk of falls? Yes   ASSISTIVE DEVICES UTILIZED TO PREVENT FALLS:  Life alert? No  Use of a cane, walker or w/c? Yes  Grab bars in the bathroom? No  Shower chair or bench in shower? No  Elevated toilet seat or a handicapped toilet? No     Cognitive Function:        09/19/2022  8:54 AM  6CIT Screen  What Year? 0 points  What month? 0 points  What time? 0 points  Count back from 20 0 points  Months in reverse 0 points  Repeat phrase 0 points  Total Score 0 points    Immunizations Immunization History  Administered Date(s) Administered   Influenza Split 02/05/2011   Influenza, High Dose Seasonal  PF 04/19/2016, 04/25/2017, 03/11/2018, 03/22/2019, 03/10/2020, 03/23/2021   Influenza-Unspecified 02/23/2015, 03/10/2020   PFIZER(Purple Top)SARS-COV-2 Vaccination 08/02/2019, 08/23/2019, 03/25/2020   Pfizer Covid-19 Vaccine Bivalent Booster 66yrs & up 07/12/2021   Pneumococcal Conjugate-13 02/21/2014   Pneumococcal Polysaccharide-23 07/20/2015, 04/19/2016, 04/25/2017, 03/13/2018, 03/12/2019, 03/23/2021   Zoster Recombinat (Shingrix) 10/05/2021   Zoster, Live 06/10/2010    TDAP status: Due, Education has been provided regarding the importance of this vaccine. Advised may receive this vaccine at local pharmacy or Health Dept. Aware to provide a copy of the vaccination record if obtained from local pharmacy or Health Dept. Verbalized acceptance and understanding.  Flu Vaccine status: Up to date [place] administered.  Pneumococcal vaccine status: Up to date  Covid-19 vaccine status: Information provided on how to obtain vaccines.   Qualifies for Shingles Vaccine? Yes   Zostavax completed Yes   Shingrix Completed?: No.    Education has been provided regarding the importance of this vaccine. Patient has been advised to call insurance company to determine out of pocket expense if they have not yet received this vaccine. Advised may also receive vaccine at local pharmacy or Health Dept. Verbalized acceptance and understanding.  Screening Tests Health Maintenance  Topic Date Due   DTaP/Tdap/Td (1 - Tdap) Never done   Zoster Vaccines- Shingrix (2 of 2) 11/30/2021   COVID-19 Vaccine (5 - 2023-24 season) 02/08/2022   Diabetic kidney evaluation - Urine ACR  08/30/2022   HEMOGLOBIN A1C  11/04/2022   FOOT EXAM  01/01/2023   INFLUENZA VACCINE  01/09/2023   Diabetic kidney evaluation - eGFR measurement  05/07/2023   OPHTHALMOLOGY EXAM  05/16/2023   Medicare Annual Wellness (AWV)  09/19/2023   Fecal DNA (Cologuard)  08/27/2024   Pneumonia Vaccine 42+ Years old  Completed   Hepatitis C Screening   Completed   HPV VACCINES  Aged Out    Health Maintenance  Health Maintenance Due  Topic Date Due   DTaP/Tdap/Td (1 - Tdap) Never done   Zoster Vaccines- Shingrix (2 of 2) 11/30/2021   COVID-19 Vaccine (5 - 2023-24 season) 02/08/2022   Diabetic kidney evaluation - Urine ACR  08/30/2022    Colorectal cancer screening: Type of screening: Cologuard. Completed 09/01/21. Repeat every 3 years  Lung Cancer Screening: (Low Dose CT Chest recommended if Age 64-80 years, 30 pack-year currently smoking OR have quit w/in 15years.) does not qualify.   Lung Cancer Screening Referral: no  Additional Screening:  Hepatitis C Screening: does qualify; Completed 04/16/19  Vision Screening: Recommended annual ophthalmology exams for early detection of glaucoma and other disorders of the eye. Is the patient up to date with their annual eye exam?  Yes  Who is the provider or what is the name of the office in which the patient attends annual eye exams? [facility] If pt is not established with a provider, would they like to be referred to a provider to establish care? No .   Dental Screening: Recommended annual dental exams for proper oral hygiene  Community Resource Referral / Chronic Care Management: CRR required this visit?  No   CCM required this visit?  No  Plan:     I have personally reviewed and noted the following in the patient's chart:   Medical and social history Use of alcohol, tobacco or illicit drugs  Current medications and supplements including opioid prescriptions. Patient is not currently taking opioid prescriptions. Functional ability and status Nutritional status Physical activity Advanced directives List of other physicians Hospitalizations, surgeries, and ER visits in previous 12 months Vitals Screenings to include cognitive, depression, and falls Referrals and appointments  In addition, I have reviewed and discussed with patient certain preventive protocols,  quality metrics, and best practice recommendations. A written personalized care plan for preventive services as well as general preventive health recommendations were provided to patient.     Maryan PulsChristan M Javonne Dorko, LPN   6/04/54094/04/2023   Nurse Notes: none

## 2022-09-20 ENCOUNTER — Other Ambulatory Visit: Payer: Self-pay | Admitting: Family Medicine

## 2022-09-20 DIAGNOSIS — I10 Essential (primary) hypertension: Secondary | ICD-10-CM

## 2022-09-20 DIAGNOSIS — E1129 Type 2 diabetes mellitus with other diabetic kidney complication: Secondary | ICD-10-CM

## 2022-09-30 ENCOUNTER — Other Ambulatory Visit: Payer: Self-pay | Admitting: Family Medicine

## 2022-09-30 DIAGNOSIS — I4821 Permanent atrial fibrillation: Secondary | ICD-10-CM

## 2022-09-30 DIAGNOSIS — E559 Vitamin D deficiency, unspecified: Secondary | ICD-10-CM

## 2022-09-30 DIAGNOSIS — E1169 Type 2 diabetes mellitus with other specified complication: Secondary | ICD-10-CM

## 2022-09-30 DIAGNOSIS — Z125 Encounter for screening for malignant neoplasm of prostate: Secondary | ICD-10-CM

## 2022-09-30 DIAGNOSIS — E1129 Type 2 diabetes mellitus with other diabetic kidney complication: Secondary | ICD-10-CM

## 2022-10-01 ENCOUNTER — Other Ambulatory Visit (INDEPENDENT_AMBULATORY_CARE_PROVIDER_SITE_OTHER): Payer: Medicare HMO

## 2022-10-01 DIAGNOSIS — E785 Hyperlipidemia, unspecified: Secondary | ICD-10-CM | POA: Diagnosis not present

## 2022-10-01 DIAGNOSIS — E559 Vitamin D deficiency, unspecified: Secondary | ICD-10-CM

## 2022-10-01 DIAGNOSIS — E1169 Type 2 diabetes mellitus with other specified complication: Secondary | ICD-10-CM

## 2022-10-01 DIAGNOSIS — I4821 Permanent atrial fibrillation: Secondary | ICD-10-CM | POA: Diagnosis not present

## 2022-10-01 DIAGNOSIS — R809 Proteinuria, unspecified: Secondary | ICD-10-CM

## 2022-10-01 DIAGNOSIS — E1129 Type 2 diabetes mellitus with other diabetic kidney complication: Secondary | ICD-10-CM

## 2022-10-01 DIAGNOSIS — Z125 Encounter for screening for malignant neoplasm of prostate: Secondary | ICD-10-CM | POA: Diagnosis not present

## 2022-10-01 LAB — CBC WITH DIFFERENTIAL/PLATELET
Basophils Absolute: 0 10*3/uL (ref 0.0–0.1)
Basophils Relative: 0.6 % (ref 0.0–3.0)
Eosinophils Absolute: 0.1 10*3/uL (ref 0.0–0.7)
Eosinophils Relative: 1.2 % (ref 0.0–5.0)
HCT: 43.6 % (ref 39.0–52.0)
Hemoglobin: 14.7 g/dL (ref 13.0–17.0)
Lymphocytes Relative: 21.2 % (ref 12.0–46.0)
Lymphs Abs: 1.8 10*3/uL (ref 0.7–4.0)
MCHC: 33.8 g/dL (ref 30.0–36.0)
MCV: 89.4 fl (ref 78.0–100.0)
Monocytes Absolute: 0.7 10*3/uL (ref 0.1–1.0)
Monocytes Relative: 8.1 % (ref 3.0–12.0)
Neutro Abs: 5.8 10*3/uL (ref 1.4–7.7)
Neutrophils Relative %: 68.9 % (ref 43.0–77.0)
Platelets: 212 10*3/uL (ref 150.0–400.0)
RBC: 4.88 Mil/uL (ref 4.22–5.81)
RDW: 13.8 % (ref 11.5–15.5)
WBC: 8.4 10*3/uL (ref 4.0–10.5)

## 2022-10-01 LAB — COMPREHENSIVE METABOLIC PANEL
ALT: 14 U/L (ref 0–53)
AST: 13 U/L (ref 0–37)
Albumin: 4.5 g/dL (ref 3.5–5.2)
Alkaline Phosphatase: 52 U/L (ref 39–117)
BUN: 23 mg/dL (ref 6–23)
CO2: 27 mEq/L (ref 19–32)
Calcium: 10.1 mg/dL (ref 8.4–10.5)
Chloride: 104 mEq/L (ref 96–112)
Creatinine, Ser: 0.99 mg/dL (ref 0.40–1.50)
GFR: 75.46 mL/min (ref >60.00)
Glucose, Bld: 125 mg/dL — ABNORMAL HIGH (ref 70–99)
Potassium: 4 mEq/L (ref 3.5–5.1)
Sodium: 141 mEq/L (ref 135–145)
Total Bilirubin: 0.5 mg/dL (ref 0.2–1.2)
Total Protein: 6.6 g/dL (ref 6.0–8.3)

## 2022-10-01 LAB — LIPID PANEL
Cholesterol: 106 mg/dL (ref 0–200)
HDL: 37.4 mg/dL — ABNORMAL LOW (ref >39.00)
LDL Cholesterol: 51 mg/dL (ref 0–99)
NonHDL: 68.33
Total CHOL/HDL Ratio: 3
Triglycerides: 86 mg/dL (ref 0.0–149.0)
VLDL: 17.2 mg/dL (ref 0.0–40.0)

## 2022-10-01 LAB — VITAMIN D 25 HYDROXY (VIT D DEFICIENCY, FRACTURES): VITD: 53.34 ng/mL (ref 30.00–100.00)

## 2022-10-01 LAB — PSA: PSA: 1.02 ng/mL (ref 0.10–4.00)

## 2022-10-01 LAB — HEMOGLOBIN A1C: Hgb A1c MFr Bld: 6.3 % (ref 4.6–6.5)

## 2022-10-08 ENCOUNTER — Ambulatory Visit (INDEPENDENT_AMBULATORY_CARE_PROVIDER_SITE_OTHER): Payer: Medicare HMO | Admitting: Family Medicine

## 2022-10-08 ENCOUNTER — Encounter: Payer: Self-pay | Admitting: Family Medicine

## 2022-10-08 VITALS — BP 152/90 | HR 85 | Temp 97.4°F | Ht 66.5 in | Wt 225.4 lb

## 2022-10-08 DIAGNOSIS — E1129 Type 2 diabetes mellitus with other diabetic kidney complication: Secondary | ICD-10-CM

## 2022-10-08 DIAGNOSIS — M170 Bilateral primary osteoarthritis of knee: Secondary | ICD-10-CM | POA: Diagnosis not present

## 2022-10-08 DIAGNOSIS — I4821 Permanent atrial fibrillation: Secondary | ICD-10-CM

## 2022-10-08 DIAGNOSIS — Z7984 Long term (current) use of oral hypoglycemic drugs: Secondary | ICD-10-CM

## 2022-10-08 DIAGNOSIS — E559 Vitamin D deficiency, unspecified: Secondary | ICD-10-CM | POA: Diagnosis not present

## 2022-10-08 DIAGNOSIS — E113292 Type 2 diabetes mellitus with mild nonproliferative diabetic retinopathy without macular edema, left eye: Secondary | ICD-10-CM | POA: Diagnosis not present

## 2022-10-08 DIAGNOSIS — J302 Other seasonal allergic rhinitis: Secondary | ICD-10-CM

## 2022-10-08 DIAGNOSIS — G4733 Obstructive sleep apnea (adult) (pediatric): Secondary | ICD-10-CM

## 2022-10-08 DIAGNOSIS — E1169 Type 2 diabetes mellitus with other specified complication: Secondary | ICD-10-CM | POA: Diagnosis not present

## 2022-10-08 DIAGNOSIS — I10 Essential (primary) hypertension: Secondary | ICD-10-CM | POA: Diagnosis not present

## 2022-10-08 DIAGNOSIS — Z7189 Other specified counseling: Secondary | ICD-10-CM

## 2022-10-08 DIAGNOSIS — R809 Proteinuria, unspecified: Secondary | ICD-10-CM

## 2022-10-08 DIAGNOSIS — E785 Hyperlipidemia, unspecified: Secondary | ICD-10-CM

## 2022-10-08 DIAGNOSIS — Z0000 Encounter for general adult medical examination without abnormal findings: Secondary | ICD-10-CM | POA: Diagnosis not present

## 2022-10-08 MED ORDER — ATORVASTATIN CALCIUM 40 MG PO TABS: 40.0000 mg | ORAL_TABLET | Freq: Every day | ORAL | 3 refills | Status: DC

## 2022-10-08 MED ORDER — AMLODIPINE BESYLATE 10 MG PO TABS
10.0000 mg | ORAL_TABLET | Freq: Every day | ORAL | 4 refills | Status: DC
Start: 2022-10-08 — End: 2023-11-04

## 2022-10-08 MED ORDER — METFORMIN HCL 1000 MG PO TABS: 1000.0000 mg | ORAL_TABLET | Freq: Two times a day (BID) | ORAL | 4 refills | Status: DC

## 2022-10-08 MED ORDER — ENALAPRIL MALEATE 20 MG PO TABS: 20.0000 mg | ORAL_TABLET | Freq: Every day | ORAL | 4 refills | Status: DC

## 2022-10-08 MED ORDER — METOPROLOL SUCCINATE ER 50 MG PO TB24: ORAL_TABLET | ORAL | 4 refills | Status: DC

## 2022-10-08 NOTE — Progress Notes (Unsigned)
Ph: [phone]       Fax: [phone]   Patient ID: James Mad., male    DOB: [birth year], 72 y.o.   MRN: [id number]  This visit was conducted in person.  BP (!) 152/90 (BP Location: Right Arm, Cuff Size: Large)   Pulse 85   Temp (!) 97.4 F (36.3 C) (Temporal)   Ht 5' 6.5" (1.689 m)   Wt 225 lb 6 oz (102.2 kg)   SpO2 93%   BMI 35.83 kg/m   BP Readings from Last 3 Encounters:  10/08/22 (!) 152/90  05/06/22 (!) 166/96  01/01/22 (!) 146/74   CC: CPE Subjective:   HPI: James A Noam Karaffa. is a 72 y.o. male presenting on 10/08/2022 for Annual Exam (MCR prt 2 [AWV- 09/19/22].)   Saw health advisor earlier this month for medicare wellness visit. Note reviewed.   No results found.  Flowsheet Row Clinical Support from 09/19/2022 in [facility]  PHQ-2 Total Score 0          09/19/2022    8:44 AM 09/05/2021   11:50 AM 07/20/2015   11:33 AM  Fall Risk   Falls in the past year? 0 0 No  Number falls in past yr: 0    Injury with Fall? 0    Risk for fall due to : No Fall Risks    Follow up Falls prevention discussed;Falls evaluation completed       Sees Dr Gary Fleet allergist for allergic rhinitis.   HTN - continues enalapril 20mg  daily, toprol XL 50mg  daily, and amlodipine 5mg  daily. Also on farxiga 10mg  daily. Amlodipine dose limited by simvastatin use.    Afib followed by afib clinic. S/p failed cardioversion, continuing medical management. Continues eliquis and metoprolol.   OSA - on CPAP 5-15cmH2O. Continues struggling with mask fitting. Pending sleep lab mask fitting. He is interested in [place] device evaluation.   Notes worsening L>R knee pain -would like to see orthopedic surgeon for further evaluation. Steroid shots with mild benefit 2020  DM - continues metformin 1000mg  bid, farxiga 10mg  daily Lab Results  Component Value Date   HGBA1C 6.3 10/01/2022    Preventative: Colon cancer screening - colonoscopy 2006 normal. Last  colonoscopy woke up early. Leery of rpt colonoscopies.  Cologuard negative 04/2018, 08/2021.  Prostate cancer screening - yearly PSA.  Lung cancer screening - not eligible  Flu - at [facility] yearly  COVID vaccine Pfizer x2 and booster 03/2020, bivalent 07/2021  Prevnar-13 - 02/2014, pneumovax 07/2015  Zostavax 2012  shingrix - 09/2021, doesn't remember if 2nd done Advanced directives - does not have set up. Would want wife to be HCPOA. Packet previously provided. Full code. Wouldn't want prolonged life support if terminal condition.  Seat belt use discussed  Sunscreen use discussed, no changing moles.  Ex smoker quit 1987  Alcohol - none Dentist - due Eye exam - regularly seeing for glaucoma, not on eye drops. S/p cataract surgery  Bowel - no constipation  Bladder - no incontinence   Lives with wife and children, no pets  Occupation: retired Forensic scientist  Edu: HS  Activity: no regular exercise  Diet: good water, fruits/vegetables daily, stopped sodas     Relevant past medical, surgical, family and social history reviewed and updated as indicated. Interim medical history since our last visit reviewed. Allergies and medications reviewed and updated. Outpatient Medications Prior to Visit  Medication Sig Dispense Refill   acetaminophen (TYLENOL) 650 MG  CR tablet Take 650 mg by mouth 2 (two) times daily.     apixaban (ELIQUIS) 5 MG TABS tablet Take 1 tablet by mouth twice daily 60 tablet 4   cetirizine (ZYRTEC) 10 MG tablet Take 10 mg by mouth daily.     Cholecalciferol (VITAMIN D3) 25 MCG (1000 UT) CAPS Take 2 capsules (2,000 Units total) by mouth daily.     dapagliflozin propanediol (FARXIGA) 10 MG TABS tablet Take 1 tablet (10 mg total) by mouth daily before breakfast. 30 tablet 11   diclofenac Sodium (VOLTAREN) 1 % GEL Apply 1 application topically 4 (four) times daily as needed (pain).     fluticasone (FLONASE) 50 MCG/ACT nasal spray Place 1 spray into both nostrils daily as needed for  allergies or rhinitis.     Tuberculin-Allergy Syringes (ALLERGY SYRINGE 1CC/27GX1/2") 27G X 1/2" 1 ML MISC See admin instructions.     amLODipine (NORVASC) 5 MG tablet Take 1 tablet (5 mg total) by mouth daily. 30 tablet 6   enalapril (VASOTEC) 20 MG tablet Take 1 tablet by mouth once daily 90 tablet 0   metFORMIN (GLUCOPHAGE) 1000 MG tablet TAKE 1 TABLET BY MOUTH TWICE DAILY WITH A MEAL 180 tablet 0   metoprolol succinate (TOPROL-XL) 50 MG 24 hr tablet TAKE 1 TABLET BY MOUTH ONCE DAILY. TAKE WITH OR IMMEDIATELY FOLLOWING A MEAL 90 tablet 0   simvastatin (ZOCOR) 40 MG tablet Take 1 tablet (40 mg total) by mouth at bedtime. 90 tablet 3   No facility-administered medications prior to visit.     Per HPI unless specifically indicated in ROS section below Review of Systems  Constitutional:  Negative for activity change, appetite change, chills, fatigue, fever and unexpected weight change.  HENT:  Negative for hearing loss.   Eyes:  Negative for visual disturbance.  Respiratory:  Negative for cough, chest tightness, shortness of breath and wheezing.   Cardiovascular:  Negative for chest pain, palpitations and leg swelling.  Gastrointestinal:  Negative for abdominal distention, abdominal pain, blood in stool, constipation, diarrhea, nausea and vomiting.  Genitourinary:  Negative for difficulty urinating and hematuria.  Musculoskeletal:  Negative for arthralgias, myalgias and neck pain.  Skin:  Negative for rash.  Neurological:  Negative for dizziness, seizures, syncope and headaches.  Hematological:  Negative for adenopathy. Does not bruise/bleed easily.  Psychiatric/Behavioral:  Negative for dysphoric mood. The patient is not nervous/anxious.     Objective:  BP (!) 152/90 (BP Location: Right Arm, Cuff Size: Large)   Pulse 85   Temp (!) 97.4 F (36.3 C) (Temporal)   Ht 5' 6.5" (1.689 m)   Wt 225 lb 6 oz (102.2 kg)   SpO2 93%   BMI 35.83 kg/m   Wt Readings from Last 3 Encounters:   10/08/22 225 lb 6 oz (102.2 kg)  09/19/22 222 lb (100.7 kg)  05/06/22 222 lb (100.7 kg)      Physical Exam Vitals and nursing note reviewed.  Constitutional:      General: He is not in acute distress.    Appearance: Normal appearance. He is well-developed. He is not ill-appearing.  HENT:     Head: Normocephalic and atraumatic.     Right Ear: Hearing, tympanic membrane, ear canal and external ear normal.     Left Ear: Hearing, tympanic membrane, ear canal and external ear normal.     Nose: Nose normal.     Mouth/Throat:     Mouth: Mucous membranes are moist.     Pharynx: Oropharynx is  clear. No oropharyngeal exudate.  Eyes:     General: No scleral icterus.    Extraocular Movements: Extraocular movements intact.     Conjunctiva/sclera: Conjunctivae normal.     Pupils: Pupils are equal, round, and reactive to light.  Neck:     Thyroid: No thyroid mass or thyromegaly.     Vascular: No carotid bruit.  Cardiovascular:     Rate and Rhythm: Normal rate and regular rhythm.     Pulses: Normal pulses.          Radial pulses are 2+ on the right side and 2+ on the left side.     Heart sounds: Normal heart sounds. No murmur heard. Pulmonary:     Effort: Pulmonary effort is normal. No respiratory distress.     Breath sounds: Normal breath sounds. No wheezing, rhonchi or rales.  Abdominal:     General: Bowel sounds are normal. There is no distension.     Palpations: Abdomen is soft. There is no mass.     Tenderness: There is no abdominal tenderness. There is no guarding or rebound.     Hernia: No hernia is present.  Musculoskeletal:        General: Normal range of motion.     Cervical back: Normal range of motion and neck supple.     Right lower leg: No edema.     Left lower leg: No edema.  Lymphadenopathy:     Cervical: No cervical adenopathy.  Skin:    General: Skin is warm and dry.     Findings: No rash.  Neurological:     General: No focal deficit present.     Mental Status:  He is alert and oriented to person, place, and time.  Psychiatric:        Mood and Affect: Mood normal.        Behavior: Behavior normal.        Thought Content: Thought content normal.        Judgment: Judgment normal.       Results for orders placed or performed in visit on 10/01/22  VITAMIN D 25 Hydroxy (Vit-D Deficiency, Fractures)  Result Value Ref Range   VITD 53.34 30.00 - 100.00 ng/mL  PSA  Result Value Ref Range   PSA 1.02 0.10 - 4.00 ng/mL  CBC with Differential/Platelet  Result Value Ref Range   WBC 8.4 4.0 - 10.5 K/uL   RBC 4.88 4.22 - 5.81 Mil/uL   Hemoglobin 14.7 13.0 - 17.0 g/dL   HCT 40.9 81.1 - 91.4 %   MCV 89.4 78.0 - 100.0 fl   MCHC 33.8 30.0 - 36.0 g/dL   RDW 78.2 95.6 - 21.3 %   Platelets 212.0 150.0 - 400.0 K/uL   Neutrophils Relative % 68.9 43.0 - 77.0 %   Lymphocytes Relative 21.2 12.0 - 46.0 %   Monocytes Relative 8.1 3.0 - 12.0 %   Eosinophils Relative 1.2 0.0 - 5.0 %   Basophils Relative 0.6 0.0 - 3.0 %   Neutro Abs 5.8 1.4 - 7.7 K/uL   Lymphs Abs 1.8 0.7 - 4.0 K/uL   Monocytes Absolute 0.7 0.1 - 1.0 K/uL   Eosinophils Absolute 0.1 0.0 - 0.7 K/uL   Basophils Absolute 0.0 0.0 - 0.1 K/uL  Comprehensive metabolic panel  Result Value Ref Range   Sodium 141 135 - 145 mEq/L   Potassium 4.0 3.5 - 5.1 mEq/L   Chloride 104 96 - 112 mEq/L   CO2 27 19 - 32  mEq/L   Glucose, Bld 125 (H) 70 - 99 mg/dL   BUN 23 6 - 23 mg/dL   Creatinine, Ser 1.61 0.40 - 1.50 mg/dL   Total Bilirubin 0.5 0.2 - 1.2 mg/dL   Alkaline Phosphatase 52 39 - 117 U/L   AST 13 0 - 37 U/L   ALT 14 0 - 53 U/L   Total Protein 6.6 6.0 - 8.3 g/dL   Albumin 4.5 3.5 - 5.2 g/dL   GFR 09.60 >45.40 mL/min   Calcium 10.1 8.4 - 10.5 mg/dL  Lipid panel  Result Value Ref Range   Cholesterol 106 0 - 200 mg/dL   Triglycerides 98.1 0.0 - 149.0 mg/dL   HDL 19.14 (L) >78.29 mg/dL   VLDL 56.2 0.0 - 13.0 mg/dL   LDL Cholesterol 51 0 - 99 mg/dL   Total CHOL/HDL Ratio 3    NonHDL 68.33    Hemoglobin A1c  Result Value Ref Range   Hgb A1c MFr Bld 6.3 4.6 - 6.5 %   R knee xray 05/04/2019: 1. No acute fracture or dislocation. 2. Osteoarthritic changes of the right knee with narrowing of the medial compartment.  L knee xray 05/04/2019: 1. Tricompartment degenerative change, most prominent about the medial compartment. 2. Tibial tuberosity fracture fragment noted, this is most likely old. No acute bony abnormality identified. 3. Small knee joint effusion cannot be excluded.  Assessment & Plan:   Problem List Items Addressed This Visit     Seasonal allergic rhinitis    Sees allergist Gary Fleet)      Hypertension    Chronic. Improved but still above goal. Will increase amlodipine to 10mg  daily. Log provided to monitor BP at home, let me know if consistently >150/90      Relevant Medications   amLODipine (NORVASC) 10 MG tablet   enalapril (VASOTEC) 20 MG tablet   metoprolol succinate (TOPROL-XL) 50 MG 24 hr tablet   atorvastatin (LIPITOR) 40 MG tablet   Hyperlipidemia associated with type 2 diabetes mellitus (HCC)    Chronic, good control on simvastatin. Given increasing amlodipine dose, will change statin to atorvastatin 40mg  daily. The ASCVD Risk score (Arnett DK, et al., 2019) failed to calculate for the following reasons:   The valid total cholesterol range is 130 to 320 mg/dL       Relevant Medications   amLODipine (NORVASC) 10 MG tablet   enalapril (VASOTEC) 20 MG tablet   metFORMIN (GLUCOPHAGE) 1000 MG tablet   metoprolol succinate (TOPROL-XL) 50 MG 24 hr tablet   atorvastatin (LIPITOR) 40 MG tablet   Severe obesity (BMI 35.0-39.9) with comorbidity (HCC)    Encourage healthy diet and lifestyle choices.  Obesity complicated by T2DM, HTN, HLD, osteoarthritis and afib       Relevant Medications   metFORMIN (GLUCOPHAGE) 1000 MG tablet   Type 2 diabetes mellitus with microalbuminuria, without long-term current use of insulin (HCC)    Chronic, remains in great  control on current regimen of full dose metformin, farxiga 10mg . Reassess control at 6 month DM f/u visit       Relevant Medications   enalapril (VASOTEC) 20 MG tablet   metFORMIN (GLUCOPHAGE) 1000 MG tablet   atorvastatin (LIPITOR) 40 MG tablet   Health maintenance examination - Primary (Chronic)    Preventative protocols reviewed and updated unless pt declined. Discussed healthy diet and lifestyle.       Advanced care planning/counseling discussion (Chronic)    Previously discussed Advanced directives - does not have set up. Would want  wife to be HCPOA. Packet previously provided. Full code. Wouldn't want prolonged life support if terminal condition.       Vitamin D deficiency    Continue vit D 2000 IU daily.       Primary osteoarthritis of knees, bilateral    Chronic osteoarthritis, last imaged 2020  Has received multiple steroid injections in the past, latest 2020.  Desires return to ortho in [place] for further eval/management - referral placed      Relevant Orders   Ambulatory referral to Orthopedic Surgery   Obstructive sleep apnea hypopnea, severe    Chronic, severe. Sees pulmonology on CPAP.  Pending sleep lab eval for mask fitting      Permanent atrial fibrillation (HCC)    Appreciate afib clinic care- continues eliquis, metoprolol.       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       Relevant Medications   enalapril (VASOTEC) 20 MG tablet   metFORMIN (GLUCOPHAGE) 1000 MG tablet   atorvastatin (LIPITOR) 40 MG tablet     Meds ordered this encounter  Medications   amLODipine (NORVASC) 10 MG tablet    Sig: Take 1 tablet (10 mg total) by mouth daily.    Dispense:  90 tablet    Refill:  4   enalapril (VASOTEC) 20 MG  tablet    Sig: Take 1 tablet (20 mg total) by mouth daily.    Dispense:  90 tablet    Refill:  4   metFORMIN (GLUCOPHAGE) 1000 MG tablet    Sig: Take 1 tablet (1,000 mg total) by mouth 2 (two) times daily with a meal.    Dispense:  180 tablet    Refill:  4   metoprolol succinate (TOPROL-XL) 50 MG 24 hr tablet    Sig: TAKE 1 TABLET BY MOUTH ONCE DAILY. TAKE WITH OR IMMEDIATELY FOLLOWING A MEAL    Dispense:  90 tablet    Refill:  4   atorvastatin (LIPITOR) 40 MG tablet    Sig: Take 1 tablet (40 mg total) by mouth daily.    Dispense:  90 tablet    Refill:  3    To replace simvastatin    Orders Placed This Encounter  Procedures   Ambulatory referral to Orthopedic Surgery    Referral Priority:   Routine    Referral Type:   Surgical    Referral Reason:   Specialty Services Required    Requested Specialty:   Orthopedic Surgery    Number of Visits Requested:   1    Patient Instructions  We will refer you to orthopedics in [facility] for known knee osteoarthritis.  Blood pressure is staying too high - increase amlodipine to 10mg  daily, watching for ankle swelling. Because of interaction with amlodipine and simvastatin, stop simvastatin and in its place start atorvastatin 40mg  daily for cholesterol.  Check with pharmacy that you got 2 shingrix shots (shingles). If not , get 2nd shot.  Schedule dentist appointment.  Good to see you today Return as needed or in 6 months for diabetes follow up visit   Follow up plan: Return in about 6 months (around 04/09/2023) for follow up visit.  Eustaquio Boyden, MD

## 2022-10-08 NOTE — Patient Instructions (Addendum)
We will refer you to orthopedics in [facility] for known knee osteoarthritis.  Blood pressure is staying too high - increase amlodipine to 10mg  daily, watching for ankle swelling. Because of interaction with amlodipine and simvastatin, stop simvastatin and in its place start atorvastatin 40mg  daily for cholesterol.  Check with pharmacy that you got 2 shingrix shots (shingles). If not , get 2nd shot.  Schedule dentist appointment.  Good to see you today Return as needed or in 6 months for diabetes follow up visit

## 2022-10-08 NOTE — Assessment & Plan Note (Addendum)
Preventative protocols reviewed and updated unless pt declined. Discussed healthy diet and lifestyle.  

## 2022-10-10 ENCOUNTER — Encounter: Payer: Self-pay | Admitting: Family Medicine

## 2022-10-10 NOTE — Assessment & Plan Note (Signed)
Encourage healthy diet and lifestyle choices.  Obesity complicated by T2DM, HTN, HLD, osteoarthritis and afib

## 2022-10-10 NOTE — Assessment & Plan Note (Signed)
Chronic, severe. Sees pulmonology on CPAP.  Pending sleep lab eval for mask fitting

## 2022-10-10 NOTE — Assessment & Plan Note (Signed)
Regularly sees eye doctor.  

## 2022-10-10 NOTE — Assessment & Plan Note (Addendum)
Previously discussed Advanced directives - does not have set up. Would want wife to be HCPOA. Packet previously provided. Full code. Wouldn't want prolonged life support if terminal condition.

## 2022-10-10 NOTE — Assessment & Plan Note (Addendum)
Chronic osteoarthritis, last imaged 2020  Has received multiple steroid injections in the past, latest 2020.  Desires return to ortho in [place] for further eval/management - referral placed

## 2022-10-10 NOTE — Assessment & Plan Note (Signed)
Sees allergist Gary Fleet)

## 2022-10-10 NOTE — Assessment & Plan Note (Addendum)
Chronic, remains in great control on current regimen of full dose metformin, farxiga 10mg . Reassess control at 6 month DM f/u visit

## 2022-10-10 NOTE — Assessment & Plan Note (Signed)
Appreciate afib clinic care- continues eliquis, metoprolol.

## 2022-10-10 NOTE — Assessment & Plan Note (Signed)
Chronic. Improved but still above goal. Will increase amlodipine to 10mg  daily. Log provided to monitor BP at home, let me know if consistently >150/90

## 2022-10-10 NOTE — Assessment & Plan Note (Signed)
Continue vit D 2000 IU daily.  

## 2022-10-10 NOTE — Assessment & Plan Note (Addendum)
Chronic, good control on simvastatin. Given increasing amlodipine dose, will change statin to atorvastatin 40mg  daily. The ASCVD Risk score (Arnett DK, et al., 2019) failed to calculate for the following reasons:   The valid total cholesterol range is 130 to 320 mg/dL

## 2022-10-15 ENCOUNTER — Other Ambulatory Visit (INDEPENDENT_AMBULATORY_CARE_PROVIDER_SITE_OTHER): Payer: Medicare HMO

## 2022-10-15 ENCOUNTER — Encounter: Payer: Self-pay | Admitting: Orthopaedic Surgery

## 2022-10-15 ENCOUNTER — Ambulatory Visit (INDEPENDENT_AMBULATORY_CARE_PROVIDER_SITE_OTHER): Payer: Medicare HMO | Admitting: Orthopaedic Surgery

## 2022-10-15 DIAGNOSIS — M1712 Unilateral primary osteoarthritis, left knee: Secondary | ICD-10-CM

## 2022-10-15 DIAGNOSIS — M170 Bilateral primary osteoarthritis of knee: Secondary | ICD-10-CM | POA: Diagnosis not present

## 2022-10-15 DIAGNOSIS — M1711 Unilateral primary osteoarthritis, right knee: Secondary | ICD-10-CM

## 2022-10-15 NOTE — Progress Notes (Signed)
Office Visit Note   Patient: James Robbins.           Date of Birth: 1950/12/13           MRN: [id number] Visit Date: 10/15/2022              Requested by: Eustaquio Boyden, MD [address] [place],  [place] [address] PCP: Eustaquio Boyden, MD   Assessment & Plan: Visit Diagnoses:  1. Primary osteoarthritis of right knee   2. Primary osteoarthritis of left knee     Plan: Impression is 72 year old gentleman with bilateral knee DJD with bone-on-bone varus changes.  His left knee is more symptomatic.  Based on treatment options he has elected to move forward with a left total knee replacement.  Risk benefits prognosis reviewed with the patient.  Impression is severe left knee degenerative joint disease secondary to Osteoarthritis.  Bone on bone joint space narrowing is seen on radiographs with mild varus alignment.  At this point, conservative treatments fail to provide any significant relief and the pain is severely affecting ADLs and quality of life.  Based on treatment options, the patient has elected to move forward with a knee replacement.  We have discussed the surgical risks that include but are not limited to infection, DVT, leg length discrepancy, stiffness, numbness, tingling, incomplete relief of pain.  Recovery and prognosis were also reviewed.    Eunice Blase will call the patient to confirm surgery time once we have the necessary clearances.  Anticoagulants: Eliquis (apixaban) daily Postop anticoagulation: Eliquis Diabetic: Yes  Nickel allergy: No Prior DVT/PE: No Tobacco use: No Clearances needed for surgery: Cardiology Anticipated discharge dispo: Home   Follow-Up Instructions: No follow-ups on file.   Orders:  Orders Placed This Encounter  Procedures   XR KNEE 3 VIEW LEFT   XR KNEE 3 VIEW RIGHT   No orders of the defined types were placed in this encounter.     Procedures: No procedures performed   Clinical Data: No additional  findings.   Subjective: Chief Complaint  Patient presents with   Right Knee - Pain   Left Knee - Pain    HPI James Robbins is a very pleasant 72 year old gentleman referral from primary care doctor for surgical consultation for total knee replacement.  He is here with his wife today.  He has had years of knee pain and he has had several steroid injections in the past which only give temporary relief.  He feels that his symptoms are getting worse.  He experiences constant popping.  Denies any mechanical symptoms otherwise.  He is a well-controlled diabetic. Review of Systems  Constitutional: Negative.   HENT: Negative.    Eyes: Negative.   Respiratory: Negative.    Cardiovascular: Negative.   Gastrointestinal: Negative.   Endocrine: Negative.   Genitourinary: Negative.   Skin: Negative.   Allergic/Immunologic: Negative.   Neurological: Negative.   Hematological: Negative.   Psychiatric/Behavioral: Negative.    All other systems reviewed and are negative.    Objective: Vital Signs: There were no vitals taken for this visit.  Physical Exam Vitals and nursing note reviewed.  Constitutional:      Appearance: He is well-developed.  HENT:     Head: Normocephalic and atraumatic.  Eyes:     Pupils: Pupils are equal, round, and reactive to light.  Pulmonary:     Effort: Pulmonary effort is normal.  Abdominal:     Palpations: Abdomen is soft.  Musculoskeletal:  General: Normal range of motion.     Cervical back: Neck supple.  Skin:    General: Skin is warm.  Neurological:     Mental Status: He is alert and oriented to person, place, and time.  Psychiatric:        Behavior: Behavior normal.        Thought Content: Thought content normal.        Judgment: Judgment normal.     Ortho Exam Examination of bilateral knees show varus deformity.  Medial joint line tenderness.  Pain and crepitus with range of motion. Specialty Comments:  No specialty comments  available.  Imaging: No results found.   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) 01/01/2022   Hypertensive retinopathy of both eyes 01/01/2022   Vision loss of right eye 09/06/2021   Medicare annual wellness visit, subsequent 09/05/2021   Right retinal artery branch occlusion 04/05/2021   Obstructive sleep apnea hypopnea, severe 11/07/2020   Permanent atrial fibrillation (HCC) 11/07/2020   Primary osteoarthritis of knees, bilateral 04/23/2019   Vitamin D deficiency 09/23/2016   Advanced care planning/counseling discussion 07/20/2015   Health maintenance examination 02/21/2014   Severe obesity (BMI 35.0-39.9) with comorbidity (HCC)    Type 2 diabetes mellitus with microalbuminuria, without long-term current use of insulin (HCC)    Seasonal allergic rhinitis 12/24/2011   Hypertension 12/24/2011   Hyperlipidemia associated with type 2 diabetes mellitus (HCC) 12/24/2011   Past Medical History:  Diagnosis Date   Atrial fibrillation (HCC)    Chronic kidney disease (CKD), stage I    proteinuria   Controlled type 2 diabetes mellitus with diabetic nephropathy (HCC)    Completed DSME 2017   Hematuria 2014   s/p normal urological workup ([place])   Hyperlipidemia    Hypertension    Obesity    Seasonal allergic rhinitis     Family History  Problem Relation Age of Onset   Diabetes Mother    Hypertension Mother    Hyperlipidemia Mother    CAD Father        35v CABG, smoker   Osteoporosis Sister    Cancer Neg Hx     Past Surgical History:  Procedure Laterality Date   CARDIOVERSION N/A 11/27/2020   Procedure: CARDIOVERSION;  Surgeon: Nahser, Deloris Ping, MD;  Location: [facility] ENDOSCOPY;  Service: Cardiovascular;  Laterality: N/A;   CATARACT EXTRACTION Left 08/2021   pending R   COLONOSCOPY  06/10/2004   per pt normal ([place])   VASECTOMY     VASECTOMY REVERSAL     Social History   Occupational  History   Occupation: retired    Associate Professor: UNEMPLOYED    Comment: 2006  Tobacco Use   Smoking status: Former    Types: Cigarettes    Quit date: 06/09/1986    Years since quitting: 36.3   Smokeless tobacco: Never  Substance and Sexual Activity   Alcohol use: No    Alcohol/week: 0.0 standard drinks of alcohol   Drug use: No   Sexual activity: Not on file

## 2022-10-17 DIAGNOSIS — E1121 Type 2 diabetes mellitus with diabetic nephropathy: Secondary | ICD-10-CM | POA: Diagnosis not present

## 2022-11-14 DIAGNOSIS — H40023 Open angle with borderline findings, high risk, bilateral: Secondary | ICD-10-CM | POA: Diagnosis not present

## 2022-11-14 DIAGNOSIS — E119 Type 2 diabetes mellitus without complications: Secondary | ICD-10-CM | POA: Diagnosis not present

## 2022-11-14 DIAGNOSIS — H34231 Retinal artery branch occlusion, right eye: Secondary | ICD-10-CM | POA: Diagnosis not present

## 2022-11-14 DIAGNOSIS — H401421 Capsular glaucoma with pseudoexfoliation of lens, left eye, mild stage: Secondary | ICD-10-CM | POA: Diagnosis not present

## 2022-11-14 LAB — HM DIABETES EYE EXAM

## 2022-12-04 ENCOUNTER — Other Ambulatory Visit: Payer: Self-pay | Admitting: Family Medicine

## 2023-01-01 ENCOUNTER — Ambulatory Visit (HOSPITAL_COMMUNITY)
Admission: RE | Admit: 2023-01-01 | Discharge: 2023-01-01 | Disposition: A | Discharge status: Home / Self Care | Payer: Medicare HMO | Source: Ambulatory Visit | Attending: Physician Assistant | Admitting: Physician Assistant

## 2023-01-01 ENCOUNTER — Encounter (HOSPITAL_COMMUNITY): Payer: Self-pay | Admitting: Physician Assistant

## 2023-01-01 VITALS — BP 142/78 | HR 84 | Ht 66.5 in | Wt 224.0 lb

## 2023-01-01 DIAGNOSIS — D6869 Other thrombophilia: Secondary | ICD-10-CM | POA: Diagnosis not present

## 2023-01-01 DIAGNOSIS — I4821 Permanent atrial fibrillation: Secondary | ICD-10-CM | POA: Diagnosis not present

## 2023-01-01 DIAGNOSIS — I129 Hypertensive chronic kidney disease with stage 1 through stage 4 chronic kidney disease, or unspecified chronic kidney disease: Secondary | ICD-10-CM | POA: Diagnosis not present

## 2023-01-01 DIAGNOSIS — E1122 Type 2 diabetes mellitus with diabetic chronic kidney disease: Secondary | ICD-10-CM | POA: Insufficient documentation

## 2023-01-01 DIAGNOSIS — E785 Hyperlipidemia, unspecified: Secondary | ICD-10-CM | POA: Insufficient documentation

## 2023-01-01 DIAGNOSIS — Z6835 Body mass index (BMI) 35.0-35.9, adult: Secondary | ICD-10-CM | POA: Diagnosis not present

## 2023-01-01 DIAGNOSIS — E669 Obesity, unspecified: Secondary | ICD-10-CM | POA: Insufficient documentation

## 2023-01-01 DIAGNOSIS — Z7901 Long term (current) use of anticoagulants: Secondary | ICD-10-CM | POA: Diagnosis not present

## 2023-01-01 DIAGNOSIS — N181 Chronic kidney disease, stage 1: Secondary | ICD-10-CM | POA: Insufficient documentation
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
## 2023-01-17 DIAGNOSIS — E1121 Type 2 diabetes mellitus with diabetic nephropathy: Secondary | ICD-10-CM | POA: Diagnosis not present

## 2023-01-22 ENCOUNTER — Encounter: Payer: Self-pay | Admitting: Pharmacist

## 2023-01-22 NOTE — Progress Notes (Addendum)
Pharmacy Quality Measure Review  This patient is appearing on a report for being at risk of failing the adherence measure for hypertension (ACEi/ARB) medications this calendar year.   Medication: enalapril 20 mg  Last fill date: 7/21 for 90 day supply  Medication: metformin 1000 mg   Insurance report was not up to date. No action needed at this time.   Catie Eppie Gibson, PharmD, BCACP, CPP Clinical Pharmacist [facility] [phone]

## 2023-01-27 ENCOUNTER — Other Ambulatory Visit (HOSPITAL_COMMUNITY): Payer: Self-pay | Admitting: *Deleted

## 2023-01-27 MED ORDER — APIXABAN 5 MG PO TABS: 5.0000 mg | ORAL_TABLET | Freq: Two times a day (BID) | ORAL | 11 refills | Status: DC

## 2023-01-29 ENCOUNTER — Other Ambulatory Visit (HOSPITAL_COMMUNITY): Payer: Self-pay | Admitting: *Deleted

## 2023-01-29 MED ORDER — APIXABAN 5 MG PO TABS: 5.0000 mg | ORAL_TABLET | Freq: Two times a day (BID) | ORAL | 11 refills | Status: DC

## 2023-02-13 DIAGNOSIS — J301 Allergic rhinitis due to pollen: Secondary | ICD-10-CM | POA: Diagnosis not present

## 2023-02-13 DIAGNOSIS — J3081 Allergic rhinitis due to animal (cat) (dog) hair and dander: Secondary | ICD-10-CM | POA: Diagnosis not present

## 2023-02-13 DIAGNOSIS — S60561A Insect bite (nonvenomous) of right hand, initial encounter: Secondary | ICD-10-CM | POA: Diagnosis not present

## 2023-02-13 DIAGNOSIS — J3089 Other allergic rhinitis: Secondary | ICD-10-CM | POA: Diagnosis not present

## 2023-03-12 DIAGNOSIS — J3089 Other allergic rhinitis: Secondary | ICD-10-CM | POA: Diagnosis not present

## 2023-03-12 DIAGNOSIS — J301 Allergic rhinitis due to pollen: Secondary | ICD-10-CM | POA: Diagnosis not present
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
## 2023-04-01 ENCOUNTER — Encounter: Payer: Self-pay | Admitting: Cardiovascular Disease

## 2023-04-01 ENCOUNTER — Ambulatory Visit
Payer: No Typology Code available for payment source | Attending: Cardiovascular Disease | Admitting: Cardiovascular Disease

## 2023-04-01 VITALS — BP 130/82 | HR 73 | Ht 66.5 in | Wt 231.8 lb

## 2023-04-01 DIAGNOSIS — I4821 Permanent atrial fibrillation: Secondary | ICD-10-CM

## 2023-04-01 NOTE — Patient Instructions (Signed)
Testing/Procedures: Cleared for upcoming procedure  Follow-Up: At [facility], you and your health needs are our priority.  As part of our continuing mission to provide you with exceptional heart care, we have created designated Provider Care Teams.  These Care Teams include your primary Cardiologist (physician) and Advanced Practice Providers (APPs -  Physician Assistants and Nurse Practitioners) who all work together to provide you with the care you need, when you need it.  Your next appointment:   1 year(s)  Provider:   Kristeen Miss, MD

## 2023-04-19 DIAGNOSIS — E1121 Type 2 diabetes mellitus with diabetic nephropathy: Secondary | ICD-10-CM | POA: Diagnosis not present

## 2023-05-12 ENCOUNTER — Other Ambulatory Visit (INDEPENDENT_AMBULATORY_CARE_PROVIDER_SITE_OTHER): Payer: Medicare HMO

## 2023-05-12 ENCOUNTER — Ambulatory Visit (INDEPENDENT_AMBULATORY_CARE_PROVIDER_SITE_OTHER): Payer: Medicare HMO | Admitting: Family Medicine

## 2023-05-12 ENCOUNTER — Ambulatory Visit: Payer: Medicare HMO

## 2023-05-12 ENCOUNTER — Encounter: Payer: Self-pay | Admitting: Family Medicine

## 2023-05-12 VITALS — BP 152/90 | HR 88 | Temp 97.8°F | Ht 66.5 in | Wt 234.2 lb

## 2023-05-12 DIAGNOSIS — I4821 Permanent atrial fibrillation: Secondary | ICD-10-CM | POA: Diagnosis not present

## 2023-05-12 DIAGNOSIS — I10 Essential (primary) hypertension: Secondary | ICD-10-CM | POA: Diagnosis not present

## 2023-05-12 DIAGNOSIS — M1712 Unilateral primary osteoarthritis, left knee: Secondary | ICD-10-CM | POA: Diagnosis not present

## 2023-05-12 DIAGNOSIS — E1129 Type 2 diabetes mellitus with other diabetic kidney complication: Secondary | ICD-10-CM | POA: Diagnosis not present

## 2023-05-12 DIAGNOSIS — R809 Proteinuria, unspecified: Secondary | ICD-10-CM

## 2023-05-12 DIAGNOSIS — Z7984 Long term (current) use of oral hypoglycemic drugs: Secondary | ICD-10-CM | POA: Diagnosis not present

## 2023-05-12 DIAGNOSIS — Z6837 Body mass index (BMI) 37.0-37.9, adult: Secondary | ICD-10-CM | POA: Diagnosis not present

## 2023-05-12 DIAGNOSIS — M1711 Unilateral primary osteoarthritis, right knee: Secondary | ICD-10-CM

## 2023-05-12 LAB — POCT GLYCOSYLATED HEMOGLOBIN (HGB A1C): Hemoglobin A1C: 5.9 % — AB (ref 4.0–5.6)

## 2023-05-12 NOTE — Assessment & Plan Note (Signed)
Chronic, great control on current regimen - continue farxiga and metformin.  No recent UTI or yeast infection symptoms.

## 2023-05-12 NOTE — Assessment & Plan Note (Signed)
Discussed weight gain noted. Declines GLP1RA.

## 2023-05-12 NOTE — Progress Notes (Signed)
Ph: [phone] Fax: [phone]   Patient ID: James Mad., male    DOB: 1950-07-23, 72 y.o.   MRN: [id number]  This visit was conducted in person.  BP (!) 152/90 (BP Location: Right Arm, Cuff Size: Large)   Pulse 88   Temp 97.8 F (36.6 C) (Oral)   Ht 5' 6.5" (1.689 m)   Wt 234 lb 4 oz (106.3 kg)   SpO2 97%   BMI 37.24 kg/m   BP Readings from Last 3 Encounters:  05/12/23 (!) 152/90  04/01/23 130/82  01/01/23 (!) 142/78   CC: DM f/u visit  Subjective:   HPI: James A Marqual Crookston. is a 72 y.o. male presenting on 05/12/2023 for Medical Management of Chronic Issues (Here for DM /u.)   Upcoming planned left total knee replacement January 2025 with Dr. Roda Shutters ortho. Hopeful to walk better after knee replacement.   Known atrial fibrillation followed by A-fib clinic, last seen July 2024.  Status post DC cardioversion June 2022.  He continues Eliquis and Toprol XL.  He was also referred to cardiology and established with Dr. Elease Hashimoto.   HTN - Compliant with current antihypertensive regimen of amlodipine 10mg  daily, farxiga 10mg  daily, enalapril 20mg  daily, toprol XL 50mg  daily. Does not check blood pressures at home. No low blood pressure readings or symptoms of dizziness/syncope. Denies HA, vision changes, CP/tightness, SOB, leg swelling.    DM - does not regularly check sugars. Compliant with antihyperglycemic regimen which includes: farxiga 10mg  daily, metformin 1000mg  bid. Declines GLP1RA. Denies low sugars or hypoglycemic symptoms. Denies paresthesias, blurry vision. Last diabetic eye exam 11/2022. Glucometer brand: Prodigy. Last foot exam: DUE. DSME: completed 2017. Lab Results  Component Value Date   HGBA1C 5.9 (A) 05/12/2023   Diabetic Foot Exam - Simple   Simple Foot Form Diabetic Foot exam was performed with the following findings: Yes 05/12/2023 10:44 AM  Visual Inspection No deformities, no ulcerations, no other skin breakdown bilaterally: Yes Sensation  Testing Intact to touch and monofilament testing bilaterally: Yes Pulse Check Posterior Tibialis and Dorsalis pulse intact bilaterally: Yes Comments 2+ PT bilaterally No claudication    Lab Results  Component Value Date   MICROALBUR 91.2 (H) 08/29/2021         Relevant past medical, surgical, family and social history reviewed and updated as indicated. Interim medical history since our last visit reviewed. Allergies and medications reviewed and updated. Outpatient Medications Prior to Visit  Medication Sig Dispense Refill   acetaminophen (TYLENOL) 650 MG CR tablet Take 650 mg by mouth 2 (two) times daily.     amLODipine (NORVASC) 10 MG tablet Take 1 tablet (10 mg total) by mouth daily. 90 tablet 4   apixaban (ELIQUIS) 5 MG TABS tablet Take 1 tablet (5 mg total) by mouth 2 (two) times daily. 60 tablet 11   atorvastatin (LIPITOR) 40 MG tablet Take 1 tablet (40 mg total) by mouth daily. 90 tablet 3   cetirizine (ZYRTEC) 10 MG tablet Take 10 mg by mouth daily.     Cholecalciferol (VITAMIN D3) 25 MCG (1000 UT) CAPS Take 2 capsules (2,000 Units total) by mouth daily.     dapagliflozin propanediol (FARXIGA) 10 MG TABS tablet Take 1 tablet (10 mg total) by mouth daily before breakfast. 30 tablet 11   diclofenac Sodium (VOLTAREN) 1 % GEL Apply 1 application topically 4 (four) times daily as needed (pain).     enalapril (VASOTEC) 20 MG tablet Take 1 tablet (20 mg total) by  mouth daily. 90 tablet 4   fluticasone (FLONASE) 50 MCG/ACT nasal spray Place 1 spray into both nostrils daily as needed for allergies or rhinitis.     metFORMIN (GLUCOPHAGE) 1000 MG tablet Take 1 tablet (1,000 mg total) by mouth 2 (two) times daily with a meal. 180 tablet 4   metoprolol succinate (TOPROL-XL) 50 MG 24 hr tablet TAKE 1 TABLET BY MOUTH ONCE DAILY. TAKE WITH OR IMMEDIATELY FOLLOWING A MEAL 90 tablet 4   Tuberculin-Allergy Syringes (ALLERGY SYRINGE 1CC/27GX1/2") 27G X 1/2" 1 ML MISC See admin instructions.      No facility-administered medications prior to visit.     Per HPI unless specifically indicated in ROS section below Review of Systems  Objective:  BP (!) 152/90 (BP Location: Right Arm, Cuff Size: Large)   Pulse 88   Temp 97.8 F (36.6 C) (Oral)   Ht 5' 6.5" (1.689 m)   Wt 234 lb 4 oz (106.3 kg)   SpO2 97%   BMI 37.24 kg/m   Wt Readings from Last 3 Encounters:  05/12/23 234 lb 4 oz (106.3 kg)  04/01/23 231 lb 12.8 oz (105.1 kg)  01/01/23 224 lb (101.6 kg)      Physical Exam Vitals and nursing note reviewed.  Constitutional:      Appearance: Normal appearance. He is not ill-appearing.  HENT:     Head: Normocephalic and atraumatic.     Mouth/Throat:     Mouth: Mucous membranes are moist.     Pharynx: Oropharynx is clear. No oropharyngeal exudate or posterior oropharyngeal erythema.  Eyes:     Extraocular Movements: Extraocular movements intact.     Conjunctiva/sclera: Conjunctivae normal.     Pupils: Pupils are equal, round, and reactive to light.  Cardiovascular:     Rate and Rhythm: Normal rate and regular rhythm.     Pulses: Normal pulses.     Heart sounds: Normal heart sounds. No murmur heard. Pulmonary:     Effort: Pulmonary effort is normal. No respiratory distress.     Breath sounds: Normal breath sounds. No wheezing, rhonchi or rales.  Musculoskeletal:     Right lower leg: No edema.     Left lower leg: No edema.     Comments: See HPI for foot exam if done  Skin:    General: Skin is warm and dry.     Findings: No rash.  Neurological:     Mental Status: He is alert.  Psychiatric:        Mood and Affect: Mood normal.        Behavior: Behavior normal.       Results for orders placed or performed in visit on 05/12/23  POCT glycosylated hemoglobin (Hb A1C)  Result Value Ref Range   Hemoglobin A1C 5.9 (A) 4.0 - 5.6 %   HbA1c POC (<> result, manual entry)     HbA1c, POC (prediabetic range)     HbA1c, POC (controlled diabetic range)      Assessment &  Plan:   Problem List Items Addressed This Visit     Hypertension    Chronic, BP above goal. He did just take BP meds this morning.  BP log sheet provided, I asked him to start monitoring at home and send [place] readings in 2 wks to review, then may decide on need to titrate antihypertensives accordingly      Severe obesity (BMI 35.0-39.9) with comorbidity (HCC)    Discussed weight gain noted. Declines GLP1RA.  Type 2 diabetes mellitus with microalbuminuria, without long-term current use of insulin (HCC) - Primary    Chronic, great control on current regimen - continue farxiga and metformin.  No recent UTI or yeast infection symptoms.       Relevant Orders   POCT glycosylated hemoglobin (Hb A1C) (Completed)   Permanent atrial fibrillation (HCC)    Chronic, stable period on eliquis and Toprol XL.  Released from afib clinic, established with cardiology clinic.         No orders of the defined types were placed in this encounter.   Orders Placed This Encounter  Procedures   POCT glycosylated hemoglobin (Hb A1C)    Patient Instructions  BP was elevated today - start monitoring at home a few times a week and jot #s down on BP log sheet provided today  Let me know if consistently >140/90.  Good to see you today  Return as needed or in 6 months for physical.   Follow up plan: Return if symptoms worsen or fail to improve.  Eustaquio Boyden, MD

## 2023-05-12 NOTE — Patient Instructions (Signed)
BP was elevated today - start monitoring at home a few times a week and jot #s down on BP log sheet provided today  Let me know if consistently >140/90.  Good to see you today  Return as needed or in 6 months for physical.

## 2023-05-12 NOTE — Assessment & Plan Note (Signed)
Chronic, BP above goal. He did just take BP meds this morning.  BP log sheet provided, I asked him to start monitoring at home and send [place] readings in 2 wks to review, then may decide on need to titrate antihypertensives accordingly

## 2023-05-12 NOTE — Assessment & Plan Note (Signed)
Chronic, stable period on eliquis and Toprol XL.  Released from afib clinic, established with cardiology clinic.

## 2023-05-16 ENCOUNTER — Ambulatory Visit: Payer: Medicare HMO

## 2023-05-16 DIAGNOSIS — H35033 Hypertensive retinopathy, bilateral: Secondary | ICD-10-CM | POA: Diagnosis not present

## 2023-05-16 DIAGNOSIS — H34231 Retinal artery branch occlusion, right eye: Secondary | ICD-10-CM | POA: Diagnosis not present

## 2023-05-16 DIAGNOSIS — H401420 Capsular glaucoma with pseudoexfoliation of lens, left eye, stage unspecified: Secondary | ICD-10-CM | POA: Diagnosis not present

## 2023-05-16 DIAGNOSIS — D3131 Benign neoplasm of right choroid: Secondary | ICD-10-CM | POA: Diagnosis not present

## 2023-05-16 DIAGNOSIS — H40023 Open angle with borderline findings, high risk, bilateral: Secondary | ICD-10-CM | POA: Diagnosis not present

## 2023-05-28 ENCOUNTER — Other Ambulatory Visit: Payer: Self-pay | Admitting: Family Medicine

## 2023-05-28 MED ORDER — DAPAGLIFLOZIN PROPANEDIOL 10 MG PO TABS: 10.0000 mg | ORAL_TABLET | Freq: Every day | ORAL | 11 refills | Status: DC

## 2023-05-28 NOTE — Progress Notes (Signed)
Received request from : [facility]  Office: [phone]  to refill Marcelline Deist to [facility].  Sent to pharmacy.

## 2023-06-03 NOTE — Pre-Procedure Instructions (Signed)
Surgical Instructions   Your procedure is scheduled on June 15, 2022. Report to [facility] Main Entrance "A" at 5:30 A.M., then check in with the Admitting office. Any questions or running late day of surgery: call [phone]  Questions prior to your surgery date: call [phone], Monday-Friday, 8am-4pm. If you experience any cold or flu symptoms such as cough, fever, chills, shortness of breath, etc. between now and your scheduled surgery, please notify us at the above number.     Remember:  Do not eat after midnight the night before your surgery  You may drink clear liquids until 4:30 AM the morning of your surgery.   Clear liquids allowed are: Water, Non-Citrus Juices (without pulp), Carbonated Beverages, Clear Tea (no milk, honey, etc.), Black Coffee Only (NO MILK, CREAM OR POWDERED CREAMER of any kind), and Gatorade.  Patient Instructions  The night before surgery:  No food after midnight. ONLY clear liquids after midnight  The day of surgery (if you have diabetes): Drink ONE (1) 12 oz G2 given to you in your pre admission testing appointment by 4:30 AM the morning of surgery. Drink in one sitting. Do not sip.  This drink was given to you during your hospital  pre-op appointment visit.  Nothing else to drink after completing the  12 oz bottle of G2.         If you have questions, please contact your surgeon's office.    Take these medicines the morning of surgery with A SIP OF WATER: acetaminophen (TYLENOL)  amLODipine (NORVASC)  atorvastatin (LIPITOR)  cetirizine (ZYRTEC)  metoprolol succinate (TOPROL-XL)    May take these medicines IF NEEDED: fluticasone (FLONASE) nasal spray    Follow your surgeon's instructions on when to stop apixaban (ELIQUIS).  If no instructions were given by your surgeon then you will need to call the office to get those instructions.     One week prior to surgery, STOP taking any Aspirin (unless otherwise instructed by your surgeon)  Aleve, Naproxen, Ibuprofen, Motrin, Advil, Goody's, BC's, all herbal medications, fish oil, and non-prescription vitamins. This includes your medication: diclofenac Sodium (VOLTAREN) GEL    WHAT DO I DO ABOUT MY DIABETES MEDICATION?   Do not take metFORMIN (GLUCOPHAGE) the morning of surgery.  STOP taking your dapagliflozin propanediol (FARXIGA) three days prior to surgery. Your last dose will be January 2nd.       HOW TO MANAGE YOUR DIABETES BEFORE AND AFTER SURGERY  Why is it important to control my blood sugar before and after surgery? Improving blood sugar levels before and after surgery helps healing and can limit problems. A way of improving blood sugar control is eating a healthy diet by:  Eating less sugar and carbohydrates  Increasing activity/exercise  Talking with your doctor about reaching your blood sugar goals High blood sugars (greater than 180 mg/dL) can raise your risk of infections and slow your recovery, so you will need to focus on controlling your diabetes during the weeks before surgery. Make sure that the doctor who takes care of your diabetes knows about your planned surgery including the date and location.  How do I manage my blood sugar before surgery? Check your blood sugar at least 4 times a day, starting 2 days before surgery, to make sure that the level is not too high or low.  Check your blood sugar the morning of your surgery when you wake up and every 2 hours until you get to the Short Stay unit.  If your  blood sugar is less than 70 mg/dL, you will need to treat for low blood sugar: Do not take insulin. Treat a low blood sugar (less than 70 mg/dL) with  cup of clear juice (cranberry or apple), 4 glucose tablets, OR glucose gel. Recheck blood sugar in 15 minutes after treatment (to make sure it is greater than 70 mg/dL). If your blood sugar is not greater than 70 mg/dL on recheck, call [phone] for further instructions. Report your blood sugar to  the short stay nurse when you get to Short Stay.  If you are admitted to the hospital after surgery: Your blood sugar will be checked by the staff and you will probably be given insulin after surgery (instead of oral diabetes medicines) to make sure you have good blood sugar levels. The goal for blood sugar control after surgery is 80-180 mg/dL.'                     Do NOT Smoke (Tobacco/Vaping) for 24 hours prior to your procedure.  If you use a CPAP at night, you may bring your mask/headgear for your overnight stay.   You will be asked to remove any contacts, glasses, piercing's, hearing aid's, dentures/partials prior to surgery. Please bring cases for these items if needed.    Patients discharged the day of surgery will not be allowed to drive home, and someone needs to stay with them for 24 hours.  SURGICAL WAITING ROOM VISITATION Patients may have no more than 2 support people in the waiting area - these visitors may rotate.   Pre-op nurse will coordinate an appropriate time for 1 ADULT support person, who may not rotate, to accompany patient in pre-op.  Children under the age of 77 must have an adult with them who is not the patient and must remain in the main waiting area with an adult.  If the patient needs to stay at the hospital during part of their recovery, the visitor guidelines for inpatient rooms apply.  Please refer to the [facility] website for the visitor guidelines for any additional information.   If you received a COVID test during your pre-op visit  it is requested that you wear a mask when out in public, stay away from anyone that may not be feeling well and notify your surgeon if you develop symptoms. If you have been in contact with anyone that has tested positive in the last 10 days please notify you surgeon.      Pre-operative 5 CHG Bathing Instructions   You can play a key role in reducing the risk of infection after surgery. Your skin needs to be as free  of germs as possible. You can reduce the number of germs on your skin by washing with CHG (chlorhexidine gluconate) soap before surgery. CHG is an antiseptic soap that kills germs and continues to kill germs even after washing.   DO NOT use if you have an allergy to chlorhexidine/CHG or antibacterial soaps. If your skin becomes reddened or irritated, stop using the CHG and notify one of our RNs at [phone].   Please shower with the CHG soap starting 4 days before surgery using the following schedule:     Please keep in mind the following:  DO NOT shave, including legs and underarms, starting the day of your first shower.   You may shave your face at any point before/day of surgery.  Place clean sheets on your bed the day you start using CHG soap.  Use a clean washcloth (not used since being washed) for each shower. DO NOT sleep with pets once you start using the CHG.   CHG Shower Instructions:  Wash your face and private area with normal soap. If you choose to wash your hair, wash first with your normal shampoo.  After you use shampoo/soap, rinse your hair and body thoroughly to remove shampoo/soap residue.  Turn the water OFF and apply about 3 tablespoons (45 ml) of CHG soap to a CLEAN washcloth.  Apply CHG soap ONLY FROM YOUR NECK DOWN TO YOUR TOES (washing for 3-5 minutes)  DO NOT use CHG soap on face, private areas, open wounds, or sores.  Pay special attention to the area where your surgery is being performed.  If you are having back surgery, having someone wash your back for you may be helpful. Wait 2 minutes after CHG soap is applied, then you may rinse off the CHG soap.  Pat dry with a clean towel  Put on clean clothes/pajamas   If you choose to wear lotion, please use ONLY the CHG-compatible lotions on the back of this paper.   Additional instructions for the day of surgery: DO NOT APPLY any lotions, deodorants, cologne, or perfumes.   Do not bring valuables to the hospital.  [facility] is not responsible for any belongings/valuables. Do not wear nail polish, gel polish, artificial nails, or any other type of covering on natural nails (fingers and toes) Do not wear jewelry or makeup Put on clean/comfortable clothes.  Please brush your teeth.  Ask your nurse before applying any prescription medications to the skin.     CHG Compatible Lotions   Aveeno Moisturizing lotion  Cetaphil Moisturizing Cream  Cetaphil Moisturizing Lotion  Clairol Herbal Essence Moisturizing Lotion, Dry Skin  Clairol Herbal Essence Moisturizing Lotion, Extra Dry Skin  Clairol Herbal Essence Moisturizing Lotion, Normal Skin  Curel Age Defying Therapeutic Moisturizing Lotion with Alpha Hydroxy  Curel Extreme Care Body Lotion  Curel Soothing Hands Moisturizing Hand Lotion  Curel Therapeutic Moisturizing Cream, Fragrance-Free  Curel Therapeutic Moisturizing Lotion, Fragrance-Free  Curel Therapeutic Moisturizing Lotion, Original Formula  Eucerin Daily Replenishing Lotion  Eucerin Dry Skin Therapy Plus Alpha Hydroxy Crme  Eucerin Dry Skin Therapy Plus Alpha Hydroxy Lotion  Eucerin Original Crme  Eucerin Original Lotion  Eucerin Plus Crme Eucerin Plus Lotion  Eucerin TriLipid Replenishing Lotion  Keri Anti-Bacterial Hand Lotion  Keri Deep Conditioning Original Lotion Dry Skin Formula Softly Scented  Keri Deep Conditioning Original Lotion, Fragrance Free Sensitive Skin Formula  Keri Lotion Fast Absorbing Fragrance Free Sensitive Skin Formula  Keri Lotion Fast Absorbing Softly Scented Dry Skin Formula  Keri Original Lotion  Keri Skin Renewal Lotion Keri Silky Smooth Lotion  Keri Silky Smooth Sensitive Skin Lotion  Nivea Body Creamy Conditioning Oil  Nivea Body Extra Enriched Lotion  Nivea Body Original Lotion  Nivea Body Sheer Moisturizing Lotion Nivea Crme  Nivea Skin Firming Lotion  NutraDerm 30 Skin Lotion  NutraDerm Skin Lotion  NutraDerm Therapeutic Skin Cream   NutraDerm Therapeutic Skin Lotion  ProShield Protective Hand Cream  Provon moisturizing lotion  Please read over the following fact sheets that you were given.

## 2023-06-06 ENCOUNTER — Other Ambulatory Visit: Payer: Self-pay

## 2023-06-06 ENCOUNTER — Encounter (HOSPITAL_COMMUNITY)
Admission: RE | Admit: 2023-06-06 | Discharge: 2023-06-06 | Disposition: A | Discharge status: Home / Self Care | Payer: No Typology Code available for payment source | Source: Ambulatory Visit | Attending: Orthopaedic Surgery | Admitting: Orthopaedic Surgery

## 2023-06-06 ENCOUNTER — Encounter (HOSPITAL_COMMUNITY): Payer: Self-pay

## 2023-06-06 VITALS — BP 147/86 | HR 84 | Temp 98.5°F | Resp 20 | Ht 67.0 in | Wt 241.3 lb

## 2023-06-06 DIAGNOSIS — Z01812 Encounter for preprocedural laboratory examination: Secondary | ICD-10-CM | POA: Insufficient documentation

## 2023-06-06 DIAGNOSIS — Z01818 Encounter for other preprocedural examination: Secondary | ICD-10-CM | POA: Diagnosis present

## 2023-06-06 DIAGNOSIS — M1712 Unilateral primary osteoarthritis, left knee: Secondary | ICD-10-CM | POA: Insufficient documentation

## 2023-06-06 LAB — CBC
HCT: 43.7 % (ref 39.0–52.0)
Hemoglobin: 13.9 g/dL (ref 13.0–17.0)
MCH: 29.5 pg (ref 26.0–34.0)
MCHC: 31.8 g/dL (ref 30.0–36.0)
MCV: 92.8 fL (ref 80.0–100.0)
Platelets: 230 10*3/uL (ref 150–400)
RBC: 4.71 MIL/uL (ref 4.22–5.81)
RDW: 13.2 % (ref 11.5–15.5)
WBC: 9.6 10*3/uL (ref 4.0–10.5)
nRBC: 0 % (ref 0.0–0.2)

## 2023-06-06 LAB — GLUCOSE, CAPILLARY: Glucose-Capillary: 146 mg/dL — ABNORMAL HIGH (ref 70–99)

## 2023-06-06 LAB — BASIC METABOLIC PANEL
Anion gap: 8 (ref 5–15)
BUN: 21 mg/dL (ref 8–23)
CO2: 26 mmol/L (ref 22–32)
Calcium: 9.5 mg/dL (ref 8.9–10.3)
Chloride: 108 mmol/L (ref 98–111)
Creatinine, Ser: 1.16 mg/dL (ref 0.61–1.24)
GFR, Estimated: >60 mL/min (ref >60)
Glucose, Bld: 140 mg/dL — ABNORMAL HIGH (ref 70–99)
Potassium: 4.2 mmol/L (ref 3.5–5.1)
Sodium: 142 mmol/L (ref 135–145)

## 2023-06-06 LAB — SURGICAL PCR SCREEN
MRSA, PCR: NEGATIVE
Staphylococcus aureus: NEGATIVE

## 2023-06-06 NOTE — Progress Notes (Signed)
PCP - Dr. Eustaquio Boyden Cardiologist - Dr. Laqueta Carina - LOV 04-01-23, cardiac clearance F/U 1 yr  PPM/ICD - Denies Device Orders - n/a Rep Notified - n/a  Chest x-ray - n/a EKG - 01-01-23 Stress Test - denies ECHO - 12-08-22 Cardiac Cath - denies  Sleep Study - yes CPAP - cpap - denies using stating he "feels like he is suffocating"  Fasting Blood Sugar - Denies checking his sugar - states he "leaves that up to his family doctor" Checks Blood Sugar _____ times a day - does not check  Last dose of GLP1 agonist-  denies GLP1 instructions: n/a  Blood Thinner Instructions: Eliquis - hold for 3 days prior per cardiology. Last dose on 06/12/23 Aspirin Instructions: denies taking any  ERAS Protcol - ERAS w/G2 PRE-SURGERY G2- yes, given   COVID TEST- n   Anesthesia review: Y cardiac clearance (Dr. Kristeen Miss LOV 04-01-23 w/ 1 yr F/U)  Patient denies shortness of breath, fever, cough and chest pain at PAT appointment. Patient denies any respiratory issues at this time.    All instructions explained to the patient, with a verbal understanding of the material. Patient agrees to go over the instructions while at home for a better understanding. Patient also instructed to self quarantine after being tested for COVID-19. The opportunity to ask questions was provided.

## 2023-06-09 ENCOUNTER — Other Ambulatory Visit: Payer: Self-pay | Admitting: Physician Assistant

## 2023-06-09 MED ORDER — OXYCODONE HCL 5 MG PO TABS: 5.0000 mg | ORAL_TABLET | Freq: Three times a day (TID) | ORAL | 0 refills | Status: DC | PRN

## 2023-06-09 MED ORDER — METHOCARBAMOL 750 MG PO TABS: 750.0000 mg | ORAL_TABLET | Freq: Two times a day (BID) | ORAL | 2 refills | Status: DC | PRN

## 2023-06-09 MED ORDER — ONDANSETRON HCL 4 MG PO TABS: 4.0000 mg | ORAL_TABLET | Freq: Three times a day (TID) | ORAL | 0 refills | Status: DC | PRN

## 2023-06-09 MED ORDER — DOCUSATE SODIUM 100 MG PO CAPS: 100.0000 mg | ORAL_CAPSULE | Freq: Every day | ORAL | 2 refills | Status: DC | PRN

## 2023-06-09 MED ORDER — DOXYCYCLINE HYCLATE 100 MG PO CAPS: 100.0000 mg | ORAL_CAPSULE | Freq: Two times a day (BID) | ORAL | 0 refills | Status: DC
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
## 2023-06-09 NOTE — Anesthesia Preprocedure Evaluation (Signed)
Anesthesia Evaluation    Airway        Dental   Pulmonary former smoker          Cardiovascular hypertension,      Neuro/Psych    GI/Hepatic   Endo/Other  diabetes    Renal/GU      Musculoskeletal   Abdominal   Peds  Hematology   Anesthesia Other Findings   Reproductive/Obstetrics                             Anesthesia Physical Anesthesia Plan  ASA:   Anesthesia Plan:    Post-op Pain Management:    Induction:   PONV Risk Score and Plan:   Airway Management Planned:   Additional Equipment:   Intra-op Plan:   Post-operative Plan:   Informed Consent:   Plan Discussed with:   Anesthesia Plan Comments: (PAT note written 06/09/2023 by Shonna Chock, PA-C.  )       Anesthesia Quick Evaluation

## 2023-06-13 ENCOUNTER — Telehealth: Payer: Self-pay | Admitting: *Deleted

## 2023-06-13 MED ORDER — TRANEXAMIC ACID 1000 MG/10ML IV SOLN
2000.0000 mg | INTRAVENOUS | Status: AC
  Administered 2023-06-16: 1000 mg via TOPICAL
  Filled 2023-06-13: qty 20

## 2023-06-13 NOTE — Telephone Encounter (Signed)
 Attempted call to patient to discuss his upcoming surgery with Dr. Roda Shutters on Monday. No answer and left VM requesting call back.

## 2023-06-16 ENCOUNTER — Ambulatory Visit (HOSPITAL_COMMUNITY): Payer: Self-pay | Admitting: Anesthesiology

## 2023-06-16 ENCOUNTER — Other Ambulatory Visit: Payer: Self-pay | Admitting: *Deleted

## 2023-06-16 ENCOUNTER — Ambulatory Visit (HOSPITAL_COMMUNITY): Payer: Medicare HMO | Admitting: Vascular Surgery

## 2023-06-16 ENCOUNTER — Encounter (HOSPITAL_COMMUNITY)
Admission: RE | Disposition: A | Discharge status: Home / Self Care | Payer: Self-pay | Source: Home / Self Care | Attending: Orthopaedic Surgery

## 2023-06-16 ENCOUNTER — Encounter (HOSPITAL_COMMUNITY): Payer: Self-pay | Admitting: Orthopaedic Surgery

## 2023-06-16 ENCOUNTER — Other Ambulatory Visit: Payer: Self-pay

## 2023-06-16 ENCOUNTER — Observation Stay (HOSPITAL_COMMUNITY): Payer: Medicare HMO

## 2023-06-16 ENCOUNTER — Observation Stay (HOSPITAL_COMMUNITY)
Admission: RE | Admit: 2023-06-16 | Discharge: 2023-06-17 | Disposition: A | Discharge status: Home / Self Care | Payer: Medicare HMO | Attending: Orthopaedic Surgery | Admitting: Orthopaedic Surgery

## 2023-06-16 DIAGNOSIS — I10 Essential (primary) hypertension: Secondary | ICD-10-CM | POA: Diagnosis not present

## 2023-06-16 DIAGNOSIS — Z7984 Long term (current) use of oral hypoglycemic drugs: Secondary | ICD-10-CM | POA: Insufficient documentation

## 2023-06-16 DIAGNOSIS — Z96652 Presence of left artificial knee joint: Secondary | ICD-10-CM | POA: Diagnosis not present

## 2023-06-16 DIAGNOSIS — E1122 Type 2 diabetes mellitus with diabetic chronic kidney disease: Secondary | ICD-10-CM | POA: Diagnosis not present

## 2023-06-16 DIAGNOSIS — Z87891 Personal history of nicotine dependence: Secondary | ICD-10-CM | POA: Diagnosis not present

## 2023-06-16 DIAGNOSIS — I4891 Unspecified atrial fibrillation: Secondary | ICD-10-CM | POA: Insufficient documentation

## 2023-06-16 DIAGNOSIS — Z79899 Other long term (current) drug therapy: Secondary | ICD-10-CM | POA: Insufficient documentation

## 2023-06-16 DIAGNOSIS — M25462 Effusion, left knee: Secondary | ICD-10-CM | POA: Diagnosis not present

## 2023-06-16 DIAGNOSIS — E119 Type 2 diabetes mellitus without complications: Secondary | ICD-10-CM

## 2023-06-16 DIAGNOSIS — M1712 Unilateral primary osteoarthritis, left knee: Secondary | ICD-10-CM

## 2023-06-16 DIAGNOSIS — I129 Hypertensive chronic kidney disease with stage 1 through stage 4 chronic kidney disease, or unspecified chronic kidney disease: Secondary | ICD-10-CM | POA: Diagnosis not present

## 2023-06-16 DIAGNOSIS — Z7901 Long term (current) use of anticoagulants: Secondary | ICD-10-CM | POA: Insufficient documentation

## 2023-06-16 DIAGNOSIS — N181 Chronic kidney disease, stage 1: Secondary | ICD-10-CM | POA: Diagnosis not present

## 2023-06-16 DIAGNOSIS — G8918 Other acute postprocedural pain: Secondary | ICD-10-CM | POA: Diagnosis not present

## 2023-06-16 HISTORY — PX: TOTAL KNEE ARTHROPLASTY: SHX125

## 2023-06-16 LAB — GLUCOSE, CAPILLARY
Glucose-Capillary: 128 mg/dL — ABNORMAL HIGH (ref 70–99)
Glucose-Capillary: 130 mg/dL — ABNORMAL HIGH (ref 70–99)
Glucose-Capillary: 225 mg/dL — ABNORMAL HIGH (ref 70–99)
Glucose-Capillary: 207 mg/dL — ABNORMAL HIGH (ref 70–99)
Glucose-Capillary: 223 mg/dL — ABNORMAL HIGH (ref 70–99)

## 2023-06-16 SURGERY — ARTHROPLASTY, KNEE, TOTAL
Anesthesia: Regional | Site: Knee | Laterality: Left

## 2023-06-16 MED ORDER — CEFAZOLIN SODIUM-DEXTROSE 2-4 GM/100ML-% IV SOLN
2.0000 g | INTRAVENOUS | Status: AC
Start: 2023-06-16 — End: 2023-06-16
  Administered 2023-06-16: 2 g via INTRAVENOUS

## 2023-06-16 MED ORDER — ACETAMINOPHEN 500 MG PO TABS: 1000.0000 mg | ORAL_TABLET | Freq: Once | ORAL | Status: AC

## 2023-06-16 MED ORDER — FERROUS SULFATE 325 (65 FE) MG PO TABS
325.0000 mg | ORAL_TABLET | Freq: Three times a day (TID) | ORAL | Status: DC
  Administered 2023-06-16 – 2023-06-17 (×3): 325 mg via ORAL
  Filled 2023-06-16 (×3): qty 1

## 2023-06-16 MED ORDER — CEFAZOLIN SODIUM-DEXTROSE 2-4 GM/100ML-% IV SOLN: 2.0000 g | Freq: Four times a day (QID) | INTRAVENOUS | Status: DC

## 2023-06-16 MED ORDER — LIDOCAINE 2% (20 MG/ML) 5 ML SYRINGE
INTRAMUSCULAR | Status: DC | PRN
  Administered 2023-06-16: 20 mg via INTRAVENOUS

## 2023-06-16 MED ORDER — FENTANYL CITRATE (PF) 100 MCG/2ML IJ SOLN
25.0000 ug | INTRAMUSCULAR | Status: DC | PRN
Start: 2023-06-16 — End: 2023-06-16

## 2023-06-16 MED ORDER — OXYCODONE HCL ER 10 MG PO T12A
10.0000 mg | EXTENDED_RELEASE_TABLET | Freq: Two times a day (BID) | ORAL | Status: DC
  Administered 2023-06-16 – 2023-06-17 (×3): 10 mg via ORAL
  Filled 2023-06-16 (×3): qty 1

## 2023-06-16 MED ORDER — CEFAZOLIN SODIUM-DEXTROSE 2-4 GM/100ML-% IV SOLN
2.0000 g | Freq: Four times a day (QID) | INTRAVENOUS | Status: AC
  Administered 2023-06-16 (×2): 2 g via INTRAVENOUS
  Filled 2023-06-16 (×2): qty 100

## 2023-06-16 MED ORDER — 0.9 % SODIUM CHLORIDE (POUR BTL) OPTIME
TOPICAL | Status: DC | PRN
  Administered 2023-06-16: 1000 mL

## 2023-06-16 MED ORDER — DAPAGLIFLOZIN PROPANEDIOL 10 MG PO TABS
10.0000 mg | ORAL_TABLET | Freq: Every day | ORAL | Status: DC
  Administered 2023-06-17: 10 mg via ORAL
  Filled 2023-06-16: qty 1

## 2023-06-16 MED ORDER — INSULIN ASPART 100 UNIT/ML IJ SOLN
0.0000 [IU] | Freq: Every day | INTRAMUSCULAR | Status: DC
Start: 2023-06-16 — End: 2023-06-17
  Administered 2023-06-16: 2 [IU] via SUBCUTANEOUS

## 2023-06-16 MED ORDER — POVIDONE-IODINE 10 % EX SWAB
2.0000 | Freq: Once | CUTANEOUS | Status: AC
  Administered 2023-06-16: 2 via TOPICAL

## 2023-06-16 MED ORDER — OXYCODONE HCL 5 MG PO TABS: 10.0000 mg | ORAL_TABLET | ORAL | Status: DC | PRN

## 2023-06-16 MED ORDER — METOPROLOL SUCCINATE ER 50 MG PO TB24
50.0000 mg | ORAL_TABLET | Freq: Every day | ORAL | Status: DC
  Administered 2023-06-17: 50 mg via ORAL
  Filled 2023-06-16: qty 1

## 2023-06-16 MED ORDER — BUPIVACAINE-MELOXICAM ER 400-12 MG/14ML IJ SOLN
INTRAMUSCULAR | Status: AC
  Filled 2023-06-16: qty 1

## 2023-06-16 MED ORDER — ONDANSETRON HCL 4 MG PO TABS: 4.0000 mg | ORAL_TABLET | Freq: Four times a day (QID) | ORAL | Status: DC | PRN

## 2023-06-16 MED ORDER — PROPOFOL 10 MG/ML IV BOLUS
INTRAVENOUS | Status: DC | PRN
  Administered 2023-06-16: 20 mg via INTRAVENOUS

## 2023-06-16 MED ORDER — VANCOMYCIN HCL 1000 MG IV SOLR
INTRAVENOUS | Status: DC | PRN
  Administered 2023-06-16: 1000 mg

## 2023-06-16 MED ORDER — MENTHOL 3 MG MT LOZG: 1.0000 | LOZENGE | OROMUCOSAL | Status: DC | PRN

## 2023-06-16 MED ORDER — CHLORHEXIDINE GLUCONATE 0.12 % MT SOLN: 15.0000 mL | Freq: Once | OROMUCOSAL | Status: AC

## 2023-06-16 MED ORDER — ACETAMINOPHEN 500 MG PO TABS
1000.0000 mg | ORAL_TABLET | Freq: Four times a day (QID) | ORAL | Status: AC
  Administered 2023-06-16 – 2023-06-17 (×4): 1000 mg via ORAL
  Filled 2023-06-16 (×4): qty 2

## 2023-06-16 MED ORDER — FENTANYL CITRATE (PF) 100 MCG/2ML IJ SOLN
INTRAMUSCULAR | Status: DC | PRN
  Administered 2023-06-16 (×2): 50 ug via INTRAVENOUS

## 2023-06-16 MED ORDER — ACETAMINOPHEN 500 MG PO TABS
ORAL_TABLET | ORAL | Status: AC
  Administered 2023-06-16: 500 mg via ORAL
  Filled 2023-06-16: qty 2

## 2023-06-16 MED ORDER — PHENYLEPHRINE HCL (PRESSORS) 10 MG/ML IV SOLN
INTRAVENOUS | Status: DC | PRN
  Administered 2023-06-16: 100 ug via INTRAVENOUS

## 2023-06-16 MED ORDER — PROPOFOL 500 MG/50ML IV EMUL
INTRAVENOUS | Status: DC | PRN
  Administered 2023-06-16: 35 ug/kg/min via INTRAVENOUS

## 2023-06-16 MED ORDER — KETOROLAC TROMETHAMINE 15 MG/ML IJ SOLN
7.5000 mg | Freq: Four times a day (QID) | INTRAMUSCULAR | Status: AC
  Administered 2023-06-16 – 2023-06-17 (×3): 7.5 mg via INTRAVENOUS
  Filled 2023-06-16 (×3): qty 1

## 2023-06-16 MED ORDER — ENALAPRIL MALEATE 10 MG PO TABS
20.0000 mg | ORAL_TABLET | Freq: Every day | ORAL | Status: DC
  Administered 2023-06-16 – 2023-06-17 (×2): 20 mg via ORAL
  Filled 2023-06-16 (×2): qty 2

## 2023-06-16 MED ORDER — CEFAZOLIN SODIUM-DEXTROSE 2-4 GM/100ML-% IV SOLN
INTRAVENOUS | Status: AC
  Filled 2023-06-16: qty 100

## 2023-06-16 MED ORDER — DOXYCYCLINE HYCLATE 100 MG PO TABS
100.0000 mg | ORAL_TABLET | Freq: Two times a day (BID) | ORAL | Status: DC
  Administered 2023-06-16 – 2023-06-17 (×3): 100 mg via ORAL
  Filled 2023-06-16 (×3): qty 1

## 2023-06-16 MED ORDER — METHOCARBAMOL 500 MG PO TABS: 500.0000 mg | ORAL_TABLET | Freq: Four times a day (QID) | ORAL | Status: DC | PRN

## 2023-06-16 MED ORDER — PHENYLEPHRINE HCL-NACL 20-0.9 MG/250ML-% IV SOLN
INTRAVENOUS | Status: DC | PRN
  Administered 2023-06-16: 40 ug/min via INTRAVENOUS

## 2023-06-16 MED ORDER — OXYCODONE HCL 5 MG PO TABS
5.0000 mg | ORAL_TABLET | ORAL | Status: DC | PRN
  Administered 2023-06-16: 5 mg via ORAL
  Filled 2023-06-16: qty 1

## 2023-06-16 MED ORDER — TRANEXAMIC ACID 1000 MG/10ML IV SOLN
INTRAVENOUS | Status: DC | PRN
  Administered 2023-06-16: 2000 mg via TOPICAL

## 2023-06-16 MED ORDER — DEXAMETHASONE SODIUM PHOSPHATE 10 MG/ML IJ SOLN
INTRAMUSCULAR | Status: DC | PRN
  Administered 2023-06-16: 10 mg

## 2023-06-16 MED ORDER — KETOROLAC TROMETHAMINE 15 MG/ML IJ SOLN
INTRAMUSCULAR | Status: AC
  Administered 2023-06-16: 7.5 mg via INTRAVENOUS
  Filled 2023-06-16: qty 1

## 2023-06-16 MED ORDER — ORAL CARE MOUTH RINSE: 15.0000 mL | Freq: Once | OROMUCOSAL | Status: AC

## 2023-06-16 MED ORDER — DOCUSATE SODIUM 100 MG PO CAPS
100.0000 mg | ORAL_CAPSULE | Freq: Two times a day (BID) | ORAL | Status: DC
  Administered 2023-06-16 – 2023-06-17 (×3): 100 mg via ORAL
  Filled 2023-06-16 (×3): qty 1

## 2023-06-16 MED ORDER — ONDANSETRON HCL 4 MG/2ML IJ SOLN: INTRAMUSCULAR | Status: DC | PRN

## 2023-06-16 MED ORDER — TRANEXAMIC ACID-NACL 1000-0.7 MG/100ML-% IV SOLN
INTRAVENOUS | Status: AC
  Filled 2023-06-16: qty 100

## 2023-06-16 MED ORDER — METOCLOPRAMIDE HCL 5 MG/ML IJ SOLN: 5.0000 mg | Freq: Three times a day (TID) | INTRAMUSCULAR | Status: DC | PRN

## 2023-06-16 MED ORDER — FENTANYL CITRATE (PF) 250 MCG/5ML IJ SOLN
INTRAMUSCULAR | Status: AC
  Filled 2023-06-16: qty 5

## 2023-06-16 MED ORDER — ACETAMINOPHEN 325 MG PO TABS: 325.0000 mg | ORAL_TABLET | Freq: Four times a day (QID) | ORAL | Status: DC | PRN

## 2023-06-16 MED ORDER — METHOCARBAMOL 1000 MG/10ML IJ SOLN: 500.0000 mg | Freq: Four times a day (QID) | INTRAMUSCULAR | Status: DC | PRN

## 2023-06-16 MED ORDER — BUPIVACAINE IN DEXTROSE 0.75-8.25 % IT SOLN
INTRATHECAL | Status: DC | PRN
  Administered 2023-06-16: 1.8 mL via INTRATHECAL

## 2023-06-16 MED ORDER — LACTATED RINGERS IV SOLN
INTRAVENOUS | Status: DC
Start: 2023-06-16 — End: 2023-06-16

## 2023-06-16 MED ORDER — INSULIN ASPART 100 UNIT/ML IJ SOLN
0.0000 [IU] | Freq: Three times a day (TID) | INTRAMUSCULAR | Status: DC
  Administered 2023-06-16 (×2): 7 [IU] via SUBCUTANEOUS
  Administered 2023-06-17: 3 [IU] via SUBCUTANEOUS

## 2023-06-16 MED ORDER — CHLORHEXIDINE GLUCONATE 0.12 % MT SOLN
OROMUCOSAL | Status: AC
  Administered 2023-06-16: 15 mL via OROMUCOSAL
  Filled 2023-06-16: qty 15

## 2023-06-16 MED ORDER — SODIUM CHLORIDE 0.9 % IR SOLN
Status: DC | PRN
  Administered 2023-06-16: 1000 mL

## 2023-06-16 MED ORDER — ROPIVACAINE HCL 5 MG/ML IJ SOLN
INTRAMUSCULAR | Status: DC | PRN
  Administered 2023-06-16: 20 mL via PERINEURAL

## 2023-06-16 MED ORDER — APIXABAN 5 MG PO TABS
5.0000 mg | ORAL_TABLET | Freq: Two times a day (BID) | ORAL | Status: DC
  Administered 2023-06-17: 5 mg via ORAL
  Filled 2023-06-16: qty 1

## 2023-06-16 MED ORDER — ONDANSETRON HCL 4 MG/2ML IJ SOLN: 4.0000 mg | Freq: Four times a day (QID) | INTRAMUSCULAR | Status: DC | PRN

## 2023-06-16 MED ORDER — METOCLOPRAMIDE HCL 5 MG PO TABS: 5.0000 mg | ORAL_TABLET | Freq: Three times a day (TID) | ORAL | Status: DC | PRN

## 2023-06-16 MED ORDER — DEXAMETHASONE SODIUM PHOSPHATE 10 MG/ML IJ SOLN
10.0000 mg | Freq: Once | INTRAMUSCULAR | Status: AC
  Administered 2023-06-17: 10 mg via INTRAVENOUS
  Filled 2023-06-16: qty 1

## 2023-06-16 MED ORDER — VANCOMYCIN HCL 1000 MG IV SOLR
INTRAVENOUS | Status: AC
  Filled 2023-06-16: qty 20

## 2023-06-16 MED ORDER — ONDANSETRON HCL 4 MG/2ML IJ SOLN
INTRAMUSCULAR | Status: DC | PRN
  Administered 2023-06-16: 4 mg via INTRAVENOUS

## 2023-06-16 MED ORDER — AMLODIPINE BESYLATE 10 MG PO TABS
10.0000 mg | ORAL_TABLET | Freq: Every day | ORAL | Status: DC
  Administered 2023-06-17: 10 mg via ORAL
  Filled 2023-06-16: qty 1

## 2023-06-16 MED ORDER — PROPOFOL 10 MG/ML IV BOLUS
INTRAVENOUS | Status: AC
Start: 2023-06-16 — End: ?
  Filled 2023-06-16: qty 20

## 2023-06-16 MED ORDER — TRANEXAMIC ACID-NACL 1000-0.7 MG/100ML-% IV SOLN
1000.0000 mg | Freq: Once | INTRAVENOUS | Status: AC
  Administered 2023-06-16: 1000 mg via INTRAVENOUS
  Filled 2023-06-16: qty 100

## 2023-06-16 MED ORDER — TRANEXAMIC ACID-NACL 1000-0.7 MG/100ML-% IV SOLN: 1000.0000 mg | INTRAVENOUS | Status: AC

## 2023-06-16 MED ORDER — PRONTOSAN WOUND IRRIGATION OPTIME
TOPICAL | Status: DC | PRN
  Administered 2023-06-16: 350 mL via TOPICAL

## 2023-06-16 MED ORDER — HYDROMORPHONE HCL 1 MG/ML IJ SOLN: 0.5000 mg | INTRAMUSCULAR | Status: DC | PRN

## 2023-06-16 MED ORDER — PHENOL 1.4 % MT LIQD: 1.0000 | OROMUCOSAL | Status: DC | PRN

## 2023-06-16 MED ORDER — BUPIVACAINE-MELOXICAM ER 400-12 MG/14ML IJ SOLN
INTRAMUSCULAR | Status: DC | PRN
  Administered 2023-06-16: 400 mg

## 2023-06-16 SURGICAL SUPPLY — 77 items
ALCOHOL 70% 16 OZ (MISCELLANEOUS) ×1 IMPLANT
BAG COUNTER SPONGE SURGICOUNT (BAG) IMPLANT
BAG DECANTER FOR FLEXI CONT (MISCELLANEOUS) ×1 IMPLANT
BANDAGE ESMARK 6X9 LF (GAUZE/BANDAGES/DRESSINGS) IMPLANT
BIT DRILL QUICK REL 1/8 2PK SL (BIT) IMPLANT
BLADE SAG 18X100X1.27 (BLADE) ×1 IMPLANT
BLADE SAW SGTL 73X25 THK (BLADE) ×1 IMPLANT
BNDG ESMARK 6X9 LF (GAUZE/BANDAGES/DRESSINGS) IMPLANT
BOWL SMART MIX CTS (DISPOSABLE) ×1 IMPLANT
CLSR STERI-STRIP ANTIMIC 1/2X4 (GAUZE/BANDAGES/DRESSINGS) ×2 IMPLANT
COMP FEM PS KNEE STD 10 LT (Knees) ×1 IMPLANT
COMP PATELLA PEG 3 32 (Joint) ×1 IMPLANT
COMP TIB PS G 0D LT (Joint) ×1 IMPLANT
COMPONENT FEM PS KN STD 10 LT (Knees) IMPLANT
COMPONENT PATELLA PEG 3 32 (Joint) IMPLANT
COMPONET TIB PS G 0D LT (Joint) IMPLANT
COOLER ICEMAN CLASSIC (MISCELLANEOUS) ×1 IMPLANT
COVER SURGICAL LIGHT HANDLE (MISCELLANEOUS) ×1 IMPLANT
CUFF TOURN SGL QUICK 42 (TOURNIQUET CUFF) IMPLANT
CUFF TRNQT CYL 34X4.125X (TOURNIQUET CUFF) ×1 IMPLANT
DERMABOND ADVANCED .7 DNX12 (GAUZE/BANDAGES/DRESSINGS) ×1 IMPLANT
DRAPE EXTREMITY T 121X128X90 (DISPOSABLE) ×1 IMPLANT
DRAPE HALF SHEET 40X57 (DRAPES) ×1 IMPLANT
DRAPE INCISE IOBAN 66X45 STRL (DRAPES) ×1 IMPLANT
DRAPE POUCH INSTRU U-SHP 10X18 (DRAPES) ×1 IMPLANT
DRAPE SURG ORHT 6 SPLT 77X108 (DRAPES) IMPLANT
DRAPE U-SHAPE 47X51 STRL (DRAPES) ×2 IMPLANT
DRSG AQUACEL AG ADV 3.5X10 (GAUZE/BANDAGES/DRESSINGS) ×1 IMPLANT
DURAPREP 26ML APPLICATOR (WOUND CARE) ×3 IMPLANT
ELECT CAUTERY BLADE 6.4 (BLADE) ×1 IMPLANT
ELECT PENCIL ROCKER SW 15FT (MISCELLANEOUS) ×1 IMPLANT
ELECT REM PT RETURN 9FT ADLT (ELECTROSURGICAL) ×1 IMPLANT
ELECTRODE REM PT RTRN 9FT ADLT (ELECTROSURGICAL) ×1 IMPLANT
GLOVE BIOGEL PI IND STRL 7.0 (GLOVE) ×2 IMPLANT
GLOVE BIOGEL PI IND STRL 7.5 (GLOVE) ×5 IMPLANT
GLOVE ECLIPSE 7.0 STRL STRAW (GLOVE) ×3 IMPLANT
GLOVE INDICATOR 7.0 STRL GRN (GLOVE) ×1 IMPLANT
GLOVE INDICATOR 7.5 STRL GRN (GLOVE) ×1 IMPLANT
GLOVE SURG SYN 7.5 E (GLOVE) ×2 IMPLANT
GLOVE SURG SYN 7.5 PF PI (GLOVE) ×2 IMPLANT
GLOVE SURG UNDER LTX SZ7.5 (GLOVE) ×2 IMPLANT
GLOVE SURG UNDER POLY LF SZ7 (GLOVE) ×2 IMPLANT
GOWN STRL REUS W/ TWL LRG LVL3 (GOWN DISPOSABLE) ×1 IMPLANT
GOWN STRL SURGICAL XL XLNG (GOWN DISPOSABLE) ×1 IMPLANT
GOWN TOGA ZIPPER T7+ PEEL AWAY (MISCELLANEOUS) ×2 IMPLANT
HOOD PEEL AWAY T7 (MISCELLANEOUS) ×1 IMPLANT
INSERT ARTISURF S8-11 18X22X14 (Insert) IMPLANT
KIT BASIN OR (CUSTOM PROCEDURE TRAY) ×1 IMPLANT
KIT TURNOVER KIT B (KITS) ×1 IMPLANT
MANIFOLD NEPTUNE II (INSTRUMENTS) ×1 IMPLANT
MARKER SKIN DUAL TIP RULER LAB (MISCELLANEOUS) ×2 IMPLANT
NDL SPNL 18GX3.5 QUINCKE PK (NEEDLE) ×1 IMPLANT
NEEDLE SPNL 18GX3.5 QUINCKE PK (NEEDLE) ×1 IMPLANT
NS IRRIG 1000ML POUR BTL (IV SOLUTION) ×1 IMPLANT
PACK TOTAL JOINT (CUSTOM PROCEDURE TRAY) ×1 IMPLANT
PAD ARMBOARD 7.5X6 YLW CONV (MISCELLANEOUS) ×2 IMPLANT
PAD COLD SHLDR WRAP-ON (PAD) ×1 IMPLANT
PIN DRILL HDLS TROCAR 75 4PK (PIN) IMPLANT
SCREW FEMALE HEX FIX 25X2.5 (ORTHOPEDIC DISPOSABLE SUPPLIES) IMPLANT
SET HNDPC FAN SPRY TIP SCT (DISPOSABLE) ×1 IMPLANT
SLEEVE SCD COMPRESS KNEE MED (STOCKING) IMPLANT
SOLUTION PRONTOSAN WOUND 350ML (IRRIGATION / IRRIGATOR) ×1 IMPLANT
STAPLER VISISTAT 35W (STAPLE) IMPLANT
SUCTION TUBE FRAZIER 10FR DISP (SUCTIONS) ×1 IMPLANT
SUT ETHILON 2 0 FS 18 (SUTURE) IMPLANT
SUT MNCRL AB 3-0 PS2 27 (SUTURE) IMPLANT
SUT VIC AB 0 CT1 27XBRD ANBCTR (SUTURE) ×2 IMPLANT
SUT VIC AB 1 CTX 27 (SUTURE) ×3 IMPLANT
SUT VIC AB 2-0 CT1 (SUTURE) IMPLANT
SUT VIC AB 2-0 CT1 TAPERPNT 27 (SUTURE) ×4 IMPLANT
SYR 50ML LL SCALE MARK (SYRINGE) ×2 IMPLANT
TOWEL GREEN STERILE (TOWEL DISPOSABLE) ×1 IMPLANT
TOWEL GREEN STERILE FF (TOWEL DISPOSABLE) ×1 IMPLANT
TRAY CATH INTERMITTENT SS 16FR (CATHETERS) IMPLANT
TUBE SUCT ARGYLE STRL (TUBING) ×1 IMPLANT
UNDERPAD 30X36 HEAVY ABSORB (UNDERPADS AND DIAPERS) ×1 IMPLANT
YANKAUER SUCT BULB TIP NO VENT (SUCTIONS) ×2 IMPLANT

## 2023-06-16 NOTE — H&P (Signed)
 PREOPERATIVE H&P  Chief Complaint: left knee osteoarthritis  HPI: James Robbins. is a 73 y.o. male who presents for surgical treatment of left knee osteoarthritis.  He denies any changes in medical history.  Past Surgical History:  Procedure Laterality Date   CARDIOVERSION N/A 11/27/2020   Procedure: CARDIOVERSION;  Surgeon: Nahser, Aleene PARAS, MD;  Location: [facility] ENDOSCOPY;  Service: Cardiovascular;  Laterality: N/A;   CATARACT EXTRACTION Bilateral 08/2021   COLONOSCOPY  06/10/2004   per pt normal ([place])   VASECTOMY     VASECTOMY REVERSAL     Social History   Socioeconomic History   Marital status: Married    Spouse name: Not on file   Number of children: 2   Years of education: 14   Highest education level: Not on file  Occupational History   Occupation: retired    Associate Professor: UNEMPLOYED    Comment: 2006  Tobacco Use   Smoking status: Former    Current packs/day: 0.00    Types: Cigarettes    Quit date: 06/09/1986    Years since quitting: 37.0   Smokeless tobacco: Never  Vaping Use   Vaping status: Never Used  Substance and Sexual Activity   Alcohol  use: No    Alcohol /week: 0.0 standard drinks of alcohol    Drug use: No   Sexual activity: Not Currently  Other Topics Concern   Not on file  Social History Narrative   Lives with wife and children, no pets   Occupation: retired forensic scientist   Edu: HS   Activity: no regular exercise   Diet: good water, fruits/vegetables daily, stopped sodas   Social Drivers of Corporate Investment Banker Strain: Low Risk  (09/19/2022)   Overall Financial Resource Strain (CARDIA)    Difficulty of Paying Living Expenses: Not hard at all  Food Insecurity: No Food Insecurity (09/19/2022)   Hunger Vital Sign    Worried About Running Out of Food in the Last Year: Never true    Ran Out of Food in the Last Year: Never true  Transportation Needs: No Transportation Needs (09/19/2022)   PRAPARE - Scientist, Research (physical Sciences) (Medical): No    Lack of Transportation (Non-Medical): No  Physical Activity: Inactive (09/19/2022)   Exercise Vital Sign    Days of Exercise per Week: 0 days    Minutes of Exercise per Session: 0 min  Stress: No Stress Concern Present (09/19/2022)   [employer] of Occupational Health - Occupational Stress Questionnaire    Feeling of Stress : Not at all  Social Connections: Moderately Integrated (09/19/2022)   Social Connection and Isolation Panel [NHANES]    Frequency of Communication with Friends and Family: More than three times a week    Frequency of Social Gatherings with Friends and Family: More than three times a week    Attends Religious Services: More than 4 times per year    Active Member of [employer] or Organizations: No    Attends Engineer, Structural: Never    Marital Status: Married   Family History  Problem Relation Age of Onset   Diabetes Mother    Hypertension Mother    Hyperlipidemia Mother    CAD Father        4v CABG, smoker   Osteoporosis Sister    Cancer Neg Hx    No Known Allergies Prior to Admission medications   Medication Sig Start Date End Date Taking? Authorizing Provider  acetaminophen  (TYLENOL ) 650 MG CR  tablet Take 650 mg by mouth 2 (two) times daily.   Yes [provider]  amLODipine  (NORVASC ) 10 MG tablet Take 1 tablet (10 mg total) by mouth daily. 10/08/22  Yes Rilla Baller, MD  apixaban  (ELIQUIS ) 5 MG TABS tablet Take 1 tablet (5 mg total) by mouth 2 (two) times daily. 01/29/23  Yes Fenton, Clint R, PA  atorvastatin  (LIPITOR) 40 MG tablet Take 1 tablet (40 mg total) by mouth daily. 10/08/22  Yes Rilla Baller, MD  cetirizine (ZYRTEC) 10 MG tablet Take 10 mg by mouth daily.   Yes [provider]  Cholecalciferol (VITAMIN D3) 25 MCG (1000 UT) CAPS Take 2 capsules (2,000 Units total) by mouth daily. 09/05/21  Yes Rilla Baller, MD  diclofenac Sodium (VOLTAREN) 1 % GEL Apply 1 application topically 4  (four) times daily as needed (pain).   Yes [provider]  docusate sodium  (COLACE) 100 MG capsule Take 1 capsule (100 mg total) by mouth daily as needed. 06/09/23 06/08/24  Jule Ronal CROME, PA-C  doxycycline  (VIBRAMYCIN ) 100 MG capsule Take 1 capsule (100 mg total) by mouth 2 (two) times daily. To be taken after surgery 06/09/23   Jule Ronal CROME, PA-C  enalapril  (VASOTEC ) 20 MG tablet Take 1 tablet (20 mg total) by mouth daily. 10/08/22  Yes Rilla Baller, MD  fluticasone (FLONASE) 50 MCG/ACT nasal spray Place 1 spray into both nostrils daily as needed for allergies or rhinitis.   Yes [provider]  metFORMIN  (GLUCOPHAGE ) 1000 MG tablet Take 1 tablet (1,000 mg total) by mouth 2 (two) times daily with a meal. 10/08/22  Yes Rilla Baller, MD  methocarbamol  (ROBAXIN -750) 750 MG tablet Take 1 tablet (750 mg total) by mouth 2 (two) times daily as needed for muscle spasms. 06/09/23   Jule Ronal CROME, PA-C  metoprolol  succinate (TOPROL -XL) 50 MG 24 hr tablet TAKE 1 TABLET BY MOUTH ONCE DAILY. TAKE WITH OR IMMEDIATELY FOLLOWING A MEAL 10/08/22  Yes Rilla Baller, MD  ondansetron  (ZOFRAN ) 4 MG tablet Take 1 tablet (4 mg total) by mouth every 8 (eight) hours as needed for nausea or vomiting. 06/09/23   Jule Ronal CROME, PA-C  oxyCODONE  (ROXICODONE ) 5 MG immediate release tablet Take 1-2 tablets (5-10 mg total) by mouth every 8 (eight) hours as needed. To be taken after surgery 06/09/23 06/08/24  Jule Ronal CROME, PA-C  dapagliflozin  propanediol (FARXIGA ) 10 MG TABS tablet Take 1 tablet (10 mg total) by mouth daily before breakfast. 05/28/23   Rilla Baller, MD  Tuberculin-Allergy Syringes (ALLERGY SYRINGE 1CC/27GX1/2) 27G X 1/2 1 ML MISC See admin instructions. 04/05/19   [provider]     Positive ROS: All other systems have been reviewed and were otherwise negative with the exception of those mentioned in the HPI and as above.  Physical Exam: General:  Alert, no acute distress Cardiovascular: No pedal edema Respiratory: No cyanosis, no use of accessory musculature GI: abdomen soft Skin: No lesions in the area of chief complaint Neurologic: Sensation intact distally Psychiatric: Patient is competent for consent with normal mood and affect Lymphatic: no lymphedema  MUSCULOSKELETAL: exam stable  Assessment: left knee osteoarthritis  Plan: Plan for Procedure(s): LEFT TOTAL KNEE ARTHROPLASTY  The risks benefits and alternatives were discussed with the patient including but not limited to the risks of nonoperative treatment, versus surgical intervention including infection, bleeding, nerve injury,  blood clots, cardiopulmonary complications, morbidity, mortality, among others, and they were willing to proceed.   Ozell Cummins, MD 06/16/2023 5:59 AM

## 2023-06-16 NOTE — Transfer of Care (Signed)
 Immediate Anesthesia Transfer of Care Note  Patient: James Robbins.  Procedure(s) Performed: LEFT TOTAL KNEE ARTHROPLASTY (Left: Knee)  Patient Location: PACU  Anesthesia Type:MAC and Spinal  Level of Consciousness: sedated  Airway & Oxygen Therapy: Patient Spontanous Breathing  Post-op Assessment: Report given to RN  Post vital signs: Reviewed and stable  Last Vitals:  Vitals Value Taken Time  BP 102/67 06/16/23 0933  Temp    Pulse 73 06/16/23 0935  Resp 15 06/16/23 0935  SpO2 95 % 06/16/23 0935  Vitals shown include unfiled device data.  Last Pain:  Vitals:   06/16/23 0606  TempSrc:   PainSc: 4       Patients Stated Pain Goal: 4 (06/16/23 0606)  Complications: No notable events documented.

## 2023-06-16 NOTE — Care Plan (Signed)
 Ortho Bundle Case Management Note  Patient Details  Name: James Robbins. MRN: [id number] Date of Birth: 04-Sep-1950   [facility] RNCM call to patient and discussed his upcoming Left total knee arthroplasty with Dr. Jerri on 06/16/23 at [facility]. Spoke mostly with his wife who had him on speakerphone in the room. He is agreeable to case management. His plan is to return home with assistance from his wife after discharge. He did receive a home CPM and has RW already. No other DME needed. Anticipate HHPT after short hospital stay. Patient and wife requested [facility] and referral made. Reviewed all post op care instructions and will continue to follow for needs.                  DME Arranged:  CPM (Delivered on 06/13/23; patient had RW per wife.) DME Agency:  [facility]  HH Arranged:  PT HH Agency:  [facility]  Additional Comments: Please contact me with any questions of if this plan should need to change.  Tylene Ned, RN, BSN, [employer]  [phone] 06/16/2023, 9:29 AM

## 2023-06-16 NOTE — Anesthesia Procedure Notes (Addendum)
 Anesthesia Regional Block: Adductor canal block   Pre-Anesthetic Checklist: , timeout performed,  Correct Patient, Correct Site, Correct Laterality,  Correct Procedure, Correct Position, site marked,  Risks and benefits discussed,  Pre-op evaluation,  At surgeon's request and post-op pain management  Laterality: Left  Prep: Maximum Sterile Barrier Precautions used, chloraprep       Needles:  Injection technique: Single-shot  Needle Type: Echogenic Stimulator Needle     Needle Length: 9cm  Needle Gauge: 21     Additional Needles:   Procedures:,,,, ultrasound used (permanent image in chart),,    Narrative:  Start time: 06/16/2023 7:05 AM End time: 06/16/2023 7:10 AM Injection made incrementally with aspirations every 5 mL. Anesthesiologist: Niels Marien CROME, MD

## 2023-06-16 NOTE — Evaluation (Signed)
 Physical Therapy Evaluation Patient Details Name: James Robbins. MRN: [id number] DOB: [birth year] Today's Date: 06/16/2023  History of Present Illness  73 y.o. male presents to [facility] hospital on 06/16/2023 for elective L TKA. PMH includes HTN, DMII, OSA, PAF.  Clinical Impression  Pt presents to PT with deficits in functional mobility, gait, balance, strength, ROM, endurance. Pt is able to transfer and ambulate fro short household distances with support of RW, limited by reports of knee pain. PT provides education on TKR exercise packet. Pt will benefit from continued frequent mobilization in an effort to restore independence. PT will follow up tomorrow for further gait and stair training.        If plan is discharge home, recommend the following: A little help with bathing/dressing/bathroom;Assistance with cooking/housework;Assist for transportation;Help with stairs or ramp for entrance   Can travel by private vehicle        Equipment Recommendations None recommended by PT  Recommendations for Other Services       Functional Status Assessment Patient has had a recent decline in their functional status and demonstrates the ability to make significant improvements in function in a reasonable and predictable amount of time.     Precautions / Restrictions Precautions Precautions: Fall;Knee Precaution Booklet Issued: Yes (comment) Restrictions Weight Bearing Restrictions Per Provider Order: Yes LLE Weight Bearing Per Provider Order: Weight bearing as tolerated      Mobility  Bed Mobility Overal bed mobility: Modified Independent             General bed mobility comments: supine to sit modI    Transfers Overall transfer level: Needs assistance Equipment used: Rolling walker (2 wheels) Transfers: Sit to/from Stand Sit to Stand: Contact guard assist                Ambulation/Gait Ambulation/Gait assistance: Contact guard assist Gait Distance (Feet): 30  Feet Assistive device: Rolling walker (2 wheels) Gait Pattern/deviations: Step-through pattern Gait velocity: reduced Gait velocity interpretation: <1.31 ft/sec, indicative of household ambulator   General Gait Details: slowed step-through gait, reduced stance time on LLE  Stairs            Wheelchair Mobility     Tilt Bed    Modified Rankin (Stroke Patients Only)       Balance Overall balance assessment: Needs assistance Sitting-balance support: No upper extremity supported, Feet supported Sitting balance-Leahy Scale: Good     Standing balance support: Bilateral upper extremity supported, Reliant on assistive device for balance Standing balance-Leahy Scale: Poor                               Pertinent Vitals/Pain Pain Assessment Pain Assessment: Faces Faces Pain Scale: Hurts even more Pain Location: L knee Pain Descriptors / Indicators: Aching Pain Intervention(s): Monitored during session    Home Living Family/patient expects to be discharged to:: Private residence Living Arrangements: Spouse/significant other Available Help at Discharge: Family;Available 24 hours/day Type of Home: House Home Access: Stairs to enter Entrance Stairs-Rails: None Entrance Stairs-Number of Steps: 2 Alternate Level Stairs-Number of Steps: 7 Home Layout: Multi-level Home Equipment: Agricultural Consultant (2 wheels);Cane - single point;Toilet riser;Shower seat      Prior Function Prior Level of Function : Independent/Modified Independent;Driving             Mobility Comments: ambulatory without DME       Extremity/Trunk Assessment   Upper Extremity Assessment Upper Extremity Assessment: Overall WFL for  tasks assessed    Lower Extremity Assessment Lower Extremity Assessment: LLE deficits/detail LLE Deficits / Details: post-op knee and strength deficits as anticipated POD 0 s/p TKA LLE Sensation: decreased light touch    Cervical / Trunk  Assessment Cervical / Trunk Assessment: Normal  Communication   Communication Communication: No apparent difficulties Cueing Techniques: Verbal cues  Cognition Arousal: Alert Behavior During Therapy: WFL for tasks assessed/performed Overall Cognitive Status: Within Functional Limits for tasks assessed                                          General Comments General comments (skin integrity, edema, etc.): VSS on RA    Exercises     Assessment/Plan    PT Assessment Patient needs continued PT services  PT Problem List Decreased strength;Decreased range of motion;Decreased activity tolerance;Decreased balance;Decreased mobility;Decreased knowledge of use of DME;Pain       PT Treatment Interventions DME instruction;Gait training;Stair training;Functional mobility training;Therapeutic activities;Therapeutic exercise;Balance training;Neuromuscular re-education;Patient/family education    PT Goals (Current goals can be found in the Care Plan section)  Acute Rehab PT Goals Patient Stated Goal: to return to independence PT Goal Formulation: With patient Time For Goal Achievement: 06/20/23 Potential to Achieve Goals: Good    Frequency Min 1X/week     Co-evaluation               AM-PAC PT 6 Clicks Mobility  Outcome Measure Help needed turning from your back to your side while in a flat bed without using bedrails?: None Help needed moving from lying on your back to sitting on the side of a flat bed without using bedrails?: None Help needed moving to and from a bed to a chair (including a wheelchair)?: A Little Help needed standing up from a chair using your arms (e.g., wheelchair or bedside chair)?: A Little Help needed to walk in hospital room?: A Little Help needed climbing 3-5 steps with a railing? : A Lot 6 Click Score: 19    End of Session Equipment Utilized During Treatment: Gait belt Activity Tolerance: Patient tolerated treatment well Patient  left: in chair;with call bell/phone within reach Nurse Communication: Mobility status PT Visit Diagnosis: Other abnormalities of gait and mobility (R26.89);Muscle weakness (generalized) (M62.81);Pain Pain - Right/Left: Left Pain - part of body: Knee    Time: 1249-1315 PT Time Calculation (min) (ACUTE ONLY): 26 min   Charges:   PT Evaluation $PT Eval Low Complexity: 1 Low   PT General Charges $$ ACUTE PT VISIT: 1 Visit         Bernardino JINNY Ruth, PT, DPT Acute Rehabilitation Office [phone]   Bernardino JINNY Ruth 06/16/2023, 1:25 PM

## 2023-06-16 NOTE — Anesthesia Procedure Notes (Signed)
 Spinal  Patient location during procedure: OR Start time: 06/16/2023 7:25 AM End time: 06/16/2023 7:27 AM Reason for block: surgical anesthesia Staffing Performed: anesthesiologist  Anesthesiologist: Niels Marien CROME, MD Performed by: Niels Marien CROME, MD Authorized by: Niels Marien CROME, MD   Preanesthetic Checklist Completed: patient identified, IV checked, risks and benefits discussed, surgical consent, monitors and equipment checked, pre-op evaluation and timeout performed Spinal Block Patient position: sitting Prep: DuraPrep and site prepped and draped Patient monitoring: cardiac monitor, continuous pulse ox and blood pressure Approach: midline Location: L3-4 Injection technique: single-shot Needle Needle type: Pencan  Needle gauge: 24 G Needle length: 9 cm Assessment Sensory level: T6 Events: CSF return Additional Notes Functioning IV was confirmed and monitors were applied. Sterile prep and drape, including hand hygiene and sterile gloves were used. The patient was positioned and the spine was prepped. The skin was anesthetized with lidocaine .  Free flow of clear CSF was obtained prior to injecting local anesthetic into the CSF.  The spinal needle aspirated freely following injection.  The needle was carefully withdrawn.  The patient tolerated the procedure well.

## 2023-06-16 NOTE — Op Note (Addendum)
 Total Knee Arthroplasty Procedure Note  Preoperative diagnosis:  Left knee osteoarthritis Symptomatic ossicle left proximal tibia  Postoperative diagnosis:same  Operative findings: Complete loss of joint space from all 3 compartments Varus deformity Flexion contracture Loose ossicle proximal tibia  Operative procedure:  Left total knee arthroplasty. CPT [phone] Removal of loose ossicle from proximal tibia.  Surgeon: N. Ozell Cummins, MD  Assist: Ronal Morna Grave, PA-C; necessary for the timely completion of procedure and due to complexity of procedure.  Anesthesia: Spinal, regional  Tourniquet time: see anesthesia record  Implants used: Zimmer persona press-fit Femur: CR 10 Tibia: G Patella: 32 mm Polyethylene: 10 mm medial congruent  Indication: James Robbins. is a 73 y.o. year old male with a history of knee pain. Having failed conservative management, the patient elected to proceed with a total knee arthroplasty.  We have reviewed the risk and benefits of the surgery and they elected to proceed after voicing understanding.  Procedure:  After informed consent was obtained and understanding of the risk were voiced including but not limited to bleeding, infection, damage to surrounding structures including nerves and vessels, blood clots, leg length inequality and the failure to achieve desired results, the operative extremity was marked with verbal confirmation of the patient in the holding area.   The patient was then brought to the operating room and transported to the operating room table in the supine position.  A tourniquet was applied to the operative extremity around the upper thigh. The operative limb was then prepped and draped in the usual sterile fashion and preoperative antibiotics were administered.  A time out was performed prior to the start of surgery confirming the correct extremity, preoperative antibiotic administration, as well as team members,  implants and instruments available for the case. Correct surgical site was also confirmed with preoperative radiographs. The limb was then elevated for exsanguination and the tourniquet was inflated. A midline incision was made and a standard medial parapatellar approach was performed.  The patella was everted which showed complete loss of articular cartilage.  The patella was prepared and sized to a 32 mm.  A cover was placed on the patella for protection from retractors.  There was a loose ossicle approximately 2-3 cm in diameter that was attached to the posterior surface of the patellar tendon.  This was carefully removed without any damage to the tendon or the insertion to the tibial tubercle.  We then turned our attention to the femur.  The ACL was sacrificed. Start site was drilled in the femur and the intramedullary distal femoral cutting guide was placed, set at 3 degrees valgus, taking 12 mm of distal resection due to the varus deformity and flexion contracture. The distal cut was made. Osteophytes were then removed.   Next, the proximal tibial cutting guide was placed with appropriate slope, varus/valgus alignment and depth of resection. A drop rod was attached to confirm that it was pointed to the second metatarsal.  The proximal tibial cut was made taking 4 mm off the lower medial side. Gap blocks were then used to assess the extension gap and alignment, and appropriate soft tissue releases were performed. Attention was turned back to the femur, which was sized using the sizing guide to a size 10. Appropriate rotation of the femoral component was determined using epicondylar axis, Whiteside's line, and assessing the flexion gap under ligament tension. The appropriate size 4-in-1 cutting block was placed and cuts were made.  Posterior femoral osteophytes and uncapped bone were  then removed with the curved osteotome.  Trial components were placed, and stability was checked in full extension,  mid-flexion, and deep flexion.  The PCL was resected in order to balance the flexion space  The patella tracked well after a limited lateral release. Trial components were then removed and tibial preparation performed.  The tibial bone quality was excellent.  The tibial trial was pointed to the medial third of the tibial tubercle.  The tibia was sized for a size G component.  The bony surfaces were irrigated with a pulse lavage and then dried. Final components were placed.  The stability of the construct was re-evaluated throughout a range of motion and found to be acceptable. The trial liner was removed, the knee was copiously irrigated, and the knee was re-evaluated for any excess bone debris. The real polyethylene liner, 10 mm thick, was inserted and checked to ensure the locking mechanism had engaged appropriately. The tourniquet was deflated and hemostasis was achieved. The wound was irrigated with normal saline.  One gram of vancomycin  powder was placed in the surgical bed.  Topical mixture of 0.25% bupivacaine  and meloxicam  was placed in the joint for postoperative pain.  Capsular closure was performed with a #1 vicryl in flexion, subcutaneous fat closed with a 0 vicryl suture, then subcutaneous tissue closed with interrupted 2.0 vicryl suture. The skin was then closed with a 2.0 nylon and dermabond. A sterile dressing was applied.  The patient was awakened in the operating room and taken to recovery in stable condition. All sponge, needle, and instrument counts were correct at the end of the case.  Morna Grave was necessary for opening, closing, retracting, limb positioning and overall facilitation and completion of the surgery.  Position: supine  Complications: none.  Time Out: performed   Drains/Packing: none  Estimated blood loss: minimal  Returned to Recovery Room: in good condition.   Antibiotics: yes   Mechanical VTE (DVT) Prophylaxis: sequential compression devices, TED  thigh-high  Chemical VTE (DVT) Prophylaxis: resume eliquis  12 hrs postop  Fluid Replacement  Crystalloid: see anesthesia record Blood: none  FFP: none   Specimens Removed: 1 to pathology   Sponge and Instrument Count Correct? yes   PACU: portable radiograph - knee AP and Lateral   Plan/RTC: Return in 2 weeks for wound check.   Weight Bearing/Load Lower Extremity: full   Implant Name Type Inv. Item Serial No. Manufacturer Lot No. LRB No. Used Action  INSERT ARTISURF S8-11 [id number] - [id number] Insert INSERT ARTISURF S8-11 [id number]  ZIMMER RECON(ORTH,TRAU,BIO,SG) [id number] Left 1 Implanted  COMP FEM PS KNEE STD 10 LT - [id number] Knees COMP FEM PS KNEE STD 10 LT  ZIMMER RECON(ORTH,TRAU,BIO,SG) [id number] Left 1 Implanted  COMP TIB PS G 0D LT - [id number] Joint COMP TIB PS G 0D LT  ZIMMER RECON(ORTH,TRAU,BIO,SG) [id number] Left 1 Implanted  COMP PATELLA PEG 3 32 - [id number] Joint COMP PATELLA PEG 3 32  ZIMMER RECON(ORTH,TRAU,BIO,SG) [id number] Left 1 Implanted    N. Ozell Cummins, MD [facility] 8:56 AM

## 2023-06-16 NOTE — Anesthesia Postprocedure Evaluation (Signed)
 Anesthesia Post Note  Patient: James A Hammen Jr.  Procedure(s) Performed: LEFT TOTAL KNEE ARTHROPLASTY (Left: Knee)     Patient location during evaluation: PACU Anesthesia Type: Regional and Spinal Level of consciousness: oriented and awake and alert Pain management: pain level controlled Vital Signs Assessment: post-procedure vital signs reviewed and stable Respiratory status: spontaneous breathing, respiratory function stable and patient connected to nasal cannula oxygen Cardiovascular status: blood pressure returned to baseline and stable Postop Assessment: no headache, no backache and no apparent nausea or vomiting Anesthetic complications: no  No notable events documented.  Last Vitals:  Vitals:   06/16/23 1015 06/16/23 1034  BP: 128/81 124/77  Pulse: 77 73  Resp: (!) 21 18  Temp: 36.6 C 36.8 C  SpO2: 94% 92%    Last Pain:  Vitals:   06/16/23 1045  TempSrc:   PainSc: 0-No pain                 Onofrio Klemp L Kalep Full

## 2023-06-16 NOTE — Discharge Instructions (Signed)

## 2023-06-17 ENCOUNTER — Encounter (HOSPITAL_COMMUNITY): Payer: Self-pay | Admitting: Orthopaedic Surgery

## 2023-06-17 DIAGNOSIS — Z7984 Long term (current) use of oral hypoglycemic drugs: Secondary | ICD-10-CM | POA: Diagnosis not present

## 2023-06-17 DIAGNOSIS — I129 Hypertensive chronic kidney disease with stage 1 through stage 4 chronic kidney disease, or unspecified chronic kidney disease: Secondary | ICD-10-CM | POA: Diagnosis not present

## 2023-06-17 DIAGNOSIS — Z7901 Long term (current) use of anticoagulants: Secondary | ICD-10-CM | POA: Diagnosis not present

## 2023-06-17 DIAGNOSIS — M1712 Unilateral primary osteoarthritis, left knee: Secondary | ICD-10-CM | POA: Diagnosis not present

## 2023-06-17 DIAGNOSIS — E1122 Type 2 diabetes mellitus with diabetic chronic kidney disease: Secondary | ICD-10-CM | POA: Diagnosis not present

## 2023-06-17 DIAGNOSIS — I4891 Unspecified atrial fibrillation: Secondary | ICD-10-CM | POA: Diagnosis not present

## 2023-06-17 DIAGNOSIS — N181 Chronic kidney disease, stage 1: Secondary | ICD-10-CM | POA: Diagnosis not present

## 2023-06-17 DIAGNOSIS — Z87891 Personal history of nicotine dependence: Secondary | ICD-10-CM | POA: Diagnosis not present

## 2023-06-17 DIAGNOSIS — Z79899 Other long term (current) drug therapy: Secondary | ICD-10-CM | POA: Diagnosis not present

## 2023-06-17 LAB — GLUCOSE, CAPILLARY: Glucose-Capillary: 129 mg/dL — ABNORMAL HIGH (ref 70–99)

## 2023-06-17 NOTE — Evaluation (Signed)
 Occupational Therapy Evaluation Patient Details Name: James Robbins. MRN: [id number] DOB: 03/12/1951 Today's Date: 06/17/2023   History of Present Illness 73 y.o. male presents to [facility] hospital on 06/16/2023 for elective L TKA. PMH includes HTN, DMII, OSA, PAF.   Clinical Impression   PTA, pt lived with his spouse and was mod I. Upon eval, pt sitting in figure 4 position in chair on arrival; able to be redirected to wit with knee in extension, but needing repeated cues to avoid pivoting on surgical knee during ADL and at rest. Able to perform LB Adl and shower transfers within precautions this session with min cues for redirection to precautions. Reiterated at end of session and have asked PT to review safe resting positions s/p knee surgery. Believe pt will be more compliant as knee block fully wears off and pt is more aware of pain status. No further acute OT needs identified. OT to sign off.       If plan is discharge home, recommend the following: Assistance with cooking/housework;Help with stairs or ramp for entrance;Assist for transportation    Functional Status Assessment  Patient has had a recent decline in their functional status and demonstrates the ability to make significant improvements in function in a reasonable and predictable amount of time.  Equipment Recommendations  None recommended by OT    Recommendations for Other Services       Precautions / Restrictions Precautions Precautions: Fall;Knee Precaution Booklet Issued:  (PT provided handout in previous session) Restrictions Weight Bearing Restrictions Per Provider Order: Yes LLE Weight Bearing Per Provider Order: Weight bearing as tolerated      Mobility Bed Mobility               General bed mobility comments: OOB in chair    Transfers Overall transfer level: Needs assistance Equipment used: Rolling walker (2 wheels) Transfers: Sit to/from Stand Sit to Stand: Supervision           General  transfer comment: for safety; approaching mod I      Balance Overall balance assessment: Needs assistance Sitting-balance support: No upper extremity supported, Feet supported Sitting balance-Leahy Scale: Good     Standing balance support: Bilateral upper extremity supported, Reliant on assistive device for balance Standing balance-Leahy Scale: Poor                             ADL either performed or assessed with clinical judgement   ADL Overall ADL's : Needs assistance/impaired Eating/Feeding: Modified independent   Grooming: Modified independent;Standing   Upper Body Bathing: Modified independent;Sitting   Lower Body Bathing: Supervison/ safety;Sit to/from stand   Upper Body Dressing : Modified independent;Sitting   Lower Body Dressing: Supervision/safety;Sit to/from stand Lower Body Dressing Details (indicate cue type and reason): redirection to avoid pivoting on LLE/figure 4 and to bend toward foot; bring foot up with hip/knee flexion only. Toilet Transfer: Supervision/safety;Rolling walker (2 wheels);Ambulation   Toileting- Clothing Manipulation and Hygiene: Supervision/safety;Sit to/from stand   Tub/ Shower Transfer: Supervision/safety;Rolling walker (2 wheels);Ambulation;Walk-in shower   Functional mobility during ADLs: Supervision/safety;Rolling walker (2 wheels)       Vision Baseline Vision/History: 0 No visual deficits Ability to See in Adequate Light: 0 Adequate Patient Visual Report: No change from baseline Vision Assessment?: No apparent visual deficits     Perception Perception: Not tested       Praxis Praxis: Not tested       Pertinent Vitals/Pain Pain  Assessment Pain Assessment: Faces Faces Pain Scale: Hurts even more Pain Location: L knee Pain Descriptors / Indicators: Aching Pain Intervention(s): Limited activity within patient's tolerance, Monitored during session     Extremity/Trunk Assessment Upper Extremity  Assessment Upper Extremity Assessment: Overall WFL for tasks assessed   Lower Extremity Assessment Lower Extremity Assessment: Defer to PT evaluation   Cervical / Trunk Assessment Cervical / Trunk Assessment: Normal   Communication Communication Communication: No apparent difficulties Cueing Techniques: Verbal cues   Cognition Arousal: Alert Behavior During Therapy: WFL for tasks assessed/performed Overall Cognitive Status: Within Functional Limits for tasks assessed                                       General Comments  VSS on RA.    Exercises     Shoulder Instructions      Home Living Family/patient expects to be discharged to:: Private residence Living Arrangements: Spouse/significant other Available Help at Discharge: Family;Available 24 hours/day Type of Home: House Home Access: Stairs to enter [employer] of Steps: 2 Entrance Stairs-Rails: None Home Layout: Multi-level Alternate Level Stairs-Number of Steps: 7 Alternate Level Stairs-Rails: Right Bathroom Shower/Tub: Walk-in shower         Home Equipment: Agricultural Consultant (2 wheels);Cane - single point;Toilet riser;Shower seat          Prior Functioning/Environment Prior Level of Function : Independent/Modified Independent;Driving             Mobility Comments: ambulatory without DME          OT Problem List: Decreased strength;Impaired balance (sitting and/or standing);Decreased range of motion;Decreased knowledge of precautions;Decreased knowledge of use of DME or AE      OT Treatment/Interventions:      OT Goals(Current goals can be found in the care plan section) Acute Rehab OT Goals Patient Stated Goal: go home OT Goal Formulation: With patient Time For Goal Achievement: 07/01/23 Potential to Achieve Goals: Good  OT Frequency:      Co-evaluation              AM-PAC OT 6 Clicks Daily Activity     Outcome Measure Help from another person eating  meals?: None Help from another person taking care of personal grooming?: A Little Help from another person toileting, which includes using toliet, bedpan, or urinal?: A Little Help from another person bathing (including washing, rinsing, drying)?: A Little Help from another person to put on and taking off regular upper body clothing?: None Help from another person to put on and taking off regular lower body clothing?: A Little 6 Click Score: 20   End of Session Equipment Utilized During Treatment: Gait belt;Rolling walker (2 wheels) Nurse Communication: Mobility status  Activity Tolerance: Patient tolerated treatment well Patient left: in chair;with call bell/phone within reach  OT Visit Diagnosis: Muscle weakness (generalized) (M62.81);Unsteadiness on feet (R26.81)                Time: 9245-9186 OT Time Calculation (min): 19 min Charges:  OT General Charges $OT Visit: 1 Visit OT Evaluation $OT Eval Low Complexity: 1 Low  Elma JONETTA Lebron FREDERICK, OTR/L [facility] Acute Rehabilitation Office: [phone]   Elma JONETTA Lebron 06/17/2023, 8:58 AM

## 2023-06-17 NOTE — Discharge Summary (Signed)
 Patient ID: James Robbins. MRN: [id number] DOB/AGE: Oct 09, 1950 73 y.o.  Admit date: 06/16/2023 Discharge date: 06/17/2023  Admission Diagnoses:  Principal Problem:   Primary osteoarthritis of left knee Active Problems:   Status post total left knee replacement   Discharge Diagnoses:  Same  Past Medical History:  Diagnosis Date   Atrial fibrillation (HCC)    Chronic kidney disease (CKD), stage I    proteinuria   Controlled type 2 diabetes mellitus with diabetic nephropathy (HCC)    Completed DSME 2017   Hematuria 2014   s/p normal urological workup ([place])   Hyperlipidemia    Hypertension    Obesity    Seasonal allergic rhinitis    Sleep apnea    has cpap machine; don't use    Surgeries: Procedure(s): LEFT TOTAL KNEE ARTHROPLASTY on 06/16/2023   Consultants:   Discharged Condition: Improved  Hospital Course: Tracie A Joniel Graumann. is an 73 y.o. male who was admitted 06/16/2023 for operative treatment ofPrimary osteoarthritis of left knee. Patient has severe unremitting pain that affects sleep, daily activities, and work/hobbies. After pre-op clearance the patient was taken to the operating room on 06/16/2023 and underwent  Procedure(s): LEFT TOTAL KNEE ARTHROPLASTY.    Patient was given perioperative antibiotics:  Anti-infectives (From admission, onward)    Start     Dose/Rate Route Frequency Ordered Stop   06/16/23 1530  ceFAZolin  (ANCEF ) IVPB 2g/100 mL premix  Status:  Discontinued        2 g 200 mL/hr over 30 Minutes Intravenous Every 6 hours 06/16/23 1029 06/16/23 1044   06/16/23 1330  ceFAZolin  (ANCEF ) IVPB 2g/100 mL premix        2 g 200 mL/hr over 30 Minutes Intravenous Every 6 hours 06/16/23 1044 06/16/23 1835   06/16/23 1030  doxycycline  (VIBRA -TABS) tablet 100 mg       Note to Pharmacy: To be taken after surgery     100 mg Oral 2 times daily 06/16/23 1029     06/16/23 0813  vancomycin  (VANCOCIN ) powder  Status:  Discontinued          As needed 06/16/23 0813  06/16/23 0929   06/16/23 0600  ceFAZolin  (ANCEF ) IVPB 2g/100 mL premix        2 g 200 mL/hr over 30 Minutes Intravenous On call to O.R. 06/16/23 0551 06/16/23 0756   06/16/23 0556  ceFAZolin  (ANCEF ) 2-4 GM/100ML-% IVPB       Note to Pharmacy: Barron Friday D: cabinet override      06/16/23 0556 06/16/23 0739        Patient was given sequential compression devices, early ambulation, and chemoprophylaxis to prevent DVT.  Patient benefited maximally from hospital stay and there were no complications.    Recent vital signs: Patient Vitals for the past 24 hrs:  BP Temp Temp src Pulse Resp SpO2  06/17/23 0732 129/81 97.7 F (36.5 C) Oral 85 18 94 %  06/17/23 0500 (!) 162/95 98.2 F (36.8 C) Oral 78 20 96 %  06/16/23 2302 131/78 (!) 97.4 F (36.3 C) Oral 86 20 95 %  06/16/23 1952 (!) 141/83 (!) 97.4 F (36.3 C) Oral 85 20 94 %  06/16/23 1609 (!) 152/92 -- -- 77 20 94 %  06/16/23 1034 124/77 98.2 F (36.8 C) Oral 73 18 92 %  06/16/23 1015 128/81 97.9 F (36.6 C) -- 77 (!) 21 94 %  06/16/23 1000 121/80 -- -- 79 (!) 21 92 %  06/16/23 0945 113/74 -- --  80 19 92 %  06/16/23 0935 102/67 (!) 97.3 F (36.3 C) -- 73 17 95 %     Recent laboratory studies: No results for input(s): WBC, HGB, HCT, PLT, NA, K, CL, CO2, BUN, CREATININE, GLUCOSE, INR, CALCIUM  in the last 72 hours.  Invalid input(s): PT, 2   Discharge Medications:   Allergies as of 06/17/2023   No Known Allergies      Medication List     STOP taking these medications    acetaminophen  650 MG CR tablet Commonly known as: TYLENOL        TAKE these medications    ALLERGY SYRINGE 1CC/27GX1/2 27G X 1/2 1 ML Misc See admin instructions.   amLODipine  10 MG tablet Commonly known as: NORVASC  Take 1 tablet (10 mg total) by mouth daily.   apixaban  5 MG Tabs tablet Commonly known as: Eliquis  Take 1 tablet (5 mg total) by mouth 2 (two) times daily.   atorvastatin  40 MG tablet Commonly  known as: LIPITOR Take 1 tablet (40 mg total) by mouth daily.   cetirizine 10 MG tablet Commonly known as: ZYRTEC Take 10 mg by mouth daily.   dapagliflozin  propanediol 10 MG Tabs tablet Commonly known as: Farxiga  Take 1 tablet (10 mg total) by mouth daily before breakfast.   diclofenac Sodium 1 % Gel Commonly known as: VOLTAREN Apply 1 application topically 4 (four) times daily as needed (pain).   docusate sodium  100 MG capsule Commonly known as: Colace Take 1 capsule (100 mg total) by mouth daily as needed.   doxycycline  100 MG capsule Commonly known as: Vibramycin  Take 1 capsule (100 mg total) by mouth 2 (two) times daily. To be taken after surgery   enalapril  20 MG tablet Commonly known as: VASOTEC  Take 1 tablet (20 mg total) by mouth daily.   fluticasone 50 MCG/ACT nasal spray Commonly known as: FLONASE Place 1 spray into both nostrils daily as needed for allergies or rhinitis.   metFORMIN  1000 MG tablet Commonly known as: GLUCOPHAGE  Take 1 tablet (1,000 mg total) by mouth 2 (two) times daily with a meal.   methocarbamol  750 MG tablet Commonly known as: Robaxin -750 Take 1 tablet (750 mg total) by mouth 2 (two) times daily as needed for muscle spasms.   metoprolol  succinate 50 MG 24 hr tablet Commonly known as: TOPROL -XL TAKE 1 TABLET BY MOUTH ONCE DAILY. TAKE WITH OR IMMEDIATELY FOLLOWING A MEAL   ondansetron  4 MG tablet Commonly known as: Zofran  Take 1 tablet (4 mg total) by mouth every 8 (eight) hours as needed for nausea or vomiting.   oxyCODONE  5 MG immediate release tablet Commonly known as: Roxicodone  Take 1-2 tablets (5-10 mg total) by mouth every 8 (eight) hours as needed. To be taken after surgery   Vitamin D3 25 MCG (1000 UT) Caps Take 2 capsules (2,000 Units total) by mouth daily.               Durable Medical Equipment  (From admission, onward)           Start     Ordered   06/16/23 1030  DME Walker rolling  Once       Question  Answer Comment  Walker: With 5 Inch Wheels   Patient needs a walker to treat with the following condition Status post left partial knee replacement      06/16/23 1029   06/16/23 1030  DME 3 n 1  Once        06/16/23 1029   06/16/23  1030  DME Bedside commode  Once       Question:  Patient needs a bedside commode to treat with the following condition  Answer:  Status post left partial knee replacement   06/16/23 1029            Diagnostic Studies: DG Knee Left Port Result Date: 06/16/2023 CLINICAL DATA:  Postoperative total left knee arthroplasty. EXAM: PORTABLE LEFT KNEE - 1-2 VIEW COMPARISON:  Left knee radiographs 05/12/2023 FINDINGS: Interval total left knee arthroplasty. No perihardware lucency is seen to indicate hardware failure or loosening. There is a small joint effusion. Expected postoperative intra-articular and anterior subcutaneous air. Interval decrease in now mild mineralization at the superior aspect of the tibial tubercle at the patellar tendon insertion, presumably from partial resection of the prior ossicle that likely represented the sequela of chronic Osgood-Schlatter disease. No acute fracture or dislocation. IMPRESSION: Interval total left knee arthroplasty without evidence of hardware failure. Electronically Signed   By: Tanda Lyons M.D.   On: 06/16/2023 11:04    Disposition: Discharge disposition: 01-Home or Self Care          Follow-up Information     Jule Ronal CROME, PA-C. Go on 07/01/2023.   Specialty: Orthopedic Surgery Why: at 1:15 pm for your first in office appointment after surgery Contact information: [address]  [place] [address] [phone]         [facility] Follow up.   Why: Someone from the home health agency will be in contact with you to arrange your first in home physical therapy appointment with [facility]. Contact information: [address] [place] [address] [phone]                   Signed: Ronal CROME Jule 06/17/2023, 7:48 AM

## 2023-06-17 NOTE — Progress Notes (Signed)
 Subjective: 1 Day Post-Op Procedure(s) (LRB): LEFT TOTAL KNEE ARTHROPLASTY (Left) Patient reports pain as mild.    Objective: Vital signs in last 24 hours: Temp:  [97.3 F (36.3 C)-98.2 F (36.8 C)] 97.7 F (36.5 C) (01/07 0732) Pulse Rate:  [73-86] 85 (01/07 0732) Resp:  [17-21] 18 (01/07 0732) BP: (102-162)/(67-95) 129/81 (01/07 0732) SpO2:  [92 %-96 %] 94 % (01/07 0732)  Intake/Output from previous day: 01/06 0701 - 01/07 0700 In: 650 [I.V.:550; IV Piggyback:100] Out: 2275 [Urine:2175; Blood:100] Intake/Output this shift: No intake/output data recorded.  No results for input(s): HGB in the last 72 hours. No results for input(s): WBC, RBC, HCT, PLT in the last 72 hours. No results for input(s): NA, K, CL, CO2, BUN, CREATININE, GLUCOSE, CALCIUM  in the last 72 hours. No results for input(s): LABPT, INR in the last 72 hours.  Neurologically intact Neurovascular intact Sensation intact distally Intact pulses distally Dorsiflexion/Plantar flexion intact Incision: scant drainage   Assessment/Plan: 1 Day Post-Op Procedure(s) (LRB): LEFT TOTAL KNEE ARTHROPLASTY (Left) Advance diet Up with therapy D/C IV fluids Discharge home with home health once cleared by PT WBAT LLE      James Robbins Grave 06/17/2023, 7:47 AM

## 2023-06-17 NOTE — Progress Notes (Signed)
 Patient alert and oriented, void, ambulate. Surgical site is clean and dry no sign infection. D/c instructions explain and given to the patient all questions answered.

## 2023-06-17 NOTE — Progress Notes (Signed)
 Physical Therapy Treatment Patient Details Name: James Robbins. MRN: [id number] DOB: 02/28/1951 Today's Date: 06/17/2023   History of Present Illness 73 y.o. male presents to [facility] hospital on 06/16/2023 for elective L TKA. PMH includes HTN, DMII, OSA, PAF.    PT Comments  Pt received sitting in the recliner and agreeable to session.  Pt reporting very little L knee pain and is able to tolerate increased gait distance and stair trial. Pt requires up to CGA for safety and cues for technique. Pt's family present throughout for training. Education provided on maintaining L knee extension at rest, use of iceman machine, reducing fall risk, and reviewed HEP with pt and family verbalizing understanding. Anticipate pt and family will be able to manage pt's mobility needs at home once medically ready for discharge.    If plan is discharge home, recommend the following: A little help with bathing/dressing/bathroom;Assistance with cooking/housework;Assist for transportation;Help with stairs or ramp for entrance   Can travel by private vehicle        Equipment Recommendations  None recommended by PT    Recommendations for Other Services       Precautions / Restrictions Precautions Precautions: Fall;Knee Precaution Booklet Issued:  (PT provided handout in previous session) Restrictions Weight Bearing Restrictions Per Provider Order: Yes LLE Weight Bearing Per Provider Order: Weight bearing as tolerated     Mobility  Bed Mobility               General bed mobility comments: Pt in recliner at beginning and end of session    Transfers Overall transfer level: Needs assistance Equipment used: Rolling walker (2 wheels) Transfers: Sit to/from Stand Sit to Stand: Supervision                Ambulation/Gait Ambulation/Gait assistance: Supervision Gait Distance (Feet): 150 Feet Assistive device: Rolling walker (2 wheels) Gait Pattern/deviations: Step-through pattern, Decreased  stance time - left       General Gait Details: L knee remaining flexed during first part of gait trial and improves with progressed distance. Cues for pacing   Stairs Stairs: Yes Stairs assistance: Contact guard assist Stair Management: One rail Right Number of Stairs: 10 General stair comments: Cues for sequencing, although pt not always following. CGA for safety and no overt LOB      Balance Overall balance assessment: Needs assistance Sitting-balance support: No upper extremity supported, Feet supported Sitting balance-Leahy Scale: Good     Standing balance support: Bilateral upper extremity supported, Reliant on assistive device for balance, During functional activity Standing balance-Leahy Scale: Fair Standing balance comment: with RW support                            Cognition Arousal: Alert Behavior During Therapy: WFL for tasks assessed/performed Overall Cognitive Status: Within Functional Limits for tasks assessed                                          Exercises      General Comments General comments (skin integrity, edema, etc.): VSS on RA.      Pertinent Vitals/Pain Pain Assessment Pain Assessment: Faces Faces Pain Scale: Hurts a little bit Pain Location: L knee Pain Descriptors / Indicators: Aching Pain Intervention(s): Monitored during session     PT Goals (current goals can now be found in the care plan  section) Acute Rehab PT Goals Patient Stated Goal: to return to independence PT Goal Formulation: With patient Time For Goal Achievement: 06/20/23 Progress towards PT goals: Progressing toward goals    Frequency            AM-PAC PT 6 Clicks Mobility   Outcome Measure  Help needed turning from your back to your side while in a flat bed without using bedrails?: None Help needed moving from lying on your back to sitting on the side of a flat bed without using bedrails?: None Help needed moving to and  from a bed to a chair (including a wheelchair)?: A Little Help needed standing up from a chair using your arms (e.g., wheelchair or bedside chair)?: A Little Help needed to walk in hospital room?: A Little Help needed climbing 3-5 steps with a railing? : A Little 6 Click Score: 20    End of Session   Activity Tolerance: Patient tolerated treatment well Patient left: in chair;with call bell/phone within reach;with family/visitor present;with nursing/sitter in room Nurse Communication: Mobility status PT Visit Diagnosis: Other abnormalities of gait and mobility (R26.89);Muscle weakness (generalized) (M62.81);Pain     Time: 9084-9067 PT Time Calculation (min) (ACUTE ONLY): 17 min  Charges:    $Gait Training: 8-22 mins PT General Charges $$ ACUTE PT VISIT: 1 Visit                    Darryle George, PTA Acute Rehabilitation Services Secure Chat Preferred  [phone]    Darryle George 06/17/2023, 9:49 AM

## 2023-06-18 ENCOUNTER — Telehealth: Payer: Self-pay | Admitting: Orthopaedic Surgery

## 2023-06-18 DIAGNOSIS — Z7901 Long term (current) use of anticoagulants: Secondary | ICD-10-CM | POA: Diagnosis not present

## 2023-06-18 DIAGNOSIS — Z7984 Long term (current) use of oral hypoglycemic drugs: Secondary | ICD-10-CM | POA: Diagnosis not present

## 2023-06-18 DIAGNOSIS — I129 Hypertensive chronic kidney disease with stage 1 through stage 4 chronic kidney disease, or unspecified chronic kidney disease: Secondary | ICD-10-CM | POA: Diagnosis not present

## 2023-06-18 DIAGNOSIS — E785 Hyperlipidemia, unspecified: Secondary | ICD-10-CM | POA: Diagnosis not present

## 2023-06-18 DIAGNOSIS — I4891 Unspecified atrial fibrillation: Secondary | ICD-10-CM | POA: Diagnosis not present

## 2023-06-18 DIAGNOSIS — N181 Chronic kidney disease, stage 1: Secondary | ICD-10-CM | POA: Diagnosis not present

## 2023-06-18 DIAGNOSIS — G4730 Sleep apnea, unspecified: Secondary | ICD-10-CM | POA: Diagnosis not present

## 2023-06-18 DIAGNOSIS — Z471 Aftercare following joint replacement surgery: Secondary | ICD-10-CM | POA: Diagnosis not present

## 2023-06-18 DIAGNOSIS — E1122 Type 2 diabetes mellitus with diabetic chronic kidney disease: Secondary | ICD-10-CM | POA: Diagnosis not present

## 2023-06-18 NOTE — Telephone Encounter (Signed)
 Called and gave verbal

## 2023-06-18 NOTE — Telephone Encounter (Signed)
 Betsy (PT) from [facility] health called requesting verbal order for 3 wk 2. Betsy secure number is [phone]

## 2023-06-19 ENCOUNTER — Telehealth: Payer: Self-pay

## 2023-06-19 ENCOUNTER — Other Ambulatory Visit: Payer: Self-pay | Admitting: Physician Assistant

## 2023-06-19 DIAGNOSIS — G4730 Sleep apnea, unspecified: Secondary | ICD-10-CM | POA: Diagnosis not present

## 2023-06-19 DIAGNOSIS — E1122 Type 2 diabetes mellitus with diabetic chronic kidney disease: Secondary | ICD-10-CM | POA: Diagnosis not present

## 2023-06-19 DIAGNOSIS — I129 Hypertensive chronic kidney disease with stage 1 through stage 4 chronic kidney disease, or unspecified chronic kidney disease: Secondary | ICD-10-CM | POA: Diagnosis not present

## 2023-06-19 DIAGNOSIS — I4891 Unspecified atrial fibrillation: Secondary | ICD-10-CM | POA: Diagnosis not present

## 2023-06-19 DIAGNOSIS — Z471 Aftercare following joint replacement surgery: Secondary | ICD-10-CM | POA: Diagnosis not present

## 2023-06-19 DIAGNOSIS — E785 Hyperlipidemia, unspecified: Secondary | ICD-10-CM | POA: Diagnosis not present

## 2023-06-19 DIAGNOSIS — Z7984 Long term (current) use of oral hypoglycemic drugs: Secondary | ICD-10-CM | POA: Diagnosis not present

## 2023-06-19 DIAGNOSIS — Z7901 Long term (current) use of anticoagulants: Secondary | ICD-10-CM | POA: Diagnosis not present

## 2023-06-19 DIAGNOSIS — N181 Chronic kidney disease, stage 1: Secondary | ICD-10-CM | POA: Diagnosis not present

## 2023-06-19 MED ORDER — OXYCODONE HCL 5 MG PO TABS: 5.0000 mg | ORAL_TABLET | Freq: Four times a day (QID) | ORAL | 0 refills | Status: DC | PRN

## 2023-06-19 MED ORDER — METHOCARBAMOL 750 MG PO TABS: 750.0000 mg | ORAL_TABLET | Freq: Two times a day (BID) | ORAL | 2 refills | Status: DC | PRN

## 2023-06-19 NOTE — Telephone Encounter (Signed)
 Patients wife called for patient in regards to pain mgmt post op. Taking oxy 1 po q 8hrs.  Did not help very much so last night she gave him 2 at bedtime but stating that this only gives slight relief and still is at level 7 out of 10 pain.  Complains of swelling.  States they need to know what else they can do to manage his pain because he is trying to be compliant and do his exercises as instructed. Please reach out to her at [phone]

## 2023-06-19 NOTE — Telephone Encounter (Signed)
 Spoke to wife

## 2023-06-20 DIAGNOSIS — G4730 Sleep apnea, unspecified: Secondary | ICD-10-CM | POA: Diagnosis not present

## 2023-06-20 DIAGNOSIS — Z7984 Long term (current) use of oral hypoglycemic drugs: Secondary | ICD-10-CM | POA: Diagnosis not present

## 2023-06-20 DIAGNOSIS — I4891 Unspecified atrial fibrillation: Secondary | ICD-10-CM | POA: Diagnosis not present

## 2023-06-20 DIAGNOSIS — Z471 Aftercare following joint replacement surgery: Secondary | ICD-10-CM | POA: Diagnosis not present

## 2023-06-20 DIAGNOSIS — I129 Hypertensive chronic kidney disease with stage 1 through stage 4 chronic kidney disease, or unspecified chronic kidney disease: Secondary | ICD-10-CM | POA: Diagnosis not present

## 2023-06-20 DIAGNOSIS — N181 Chronic kidney disease, stage 1: Secondary | ICD-10-CM | POA: Diagnosis not present

## 2023-06-20 DIAGNOSIS — Z7901 Long term (current) use of anticoagulants: Secondary | ICD-10-CM | POA: Diagnosis not present

## 2023-06-20 DIAGNOSIS — E785 Hyperlipidemia, unspecified: Secondary | ICD-10-CM | POA: Diagnosis not present

## 2023-06-20 DIAGNOSIS — E1122 Type 2 diabetes mellitus with diabetic chronic kidney disease: Secondary | ICD-10-CM | POA: Diagnosis not present

## 2023-06-23 DIAGNOSIS — I4891 Unspecified atrial fibrillation: Secondary | ICD-10-CM | POA: Diagnosis not present

## 2023-06-23 DIAGNOSIS — E1122 Type 2 diabetes mellitus with diabetic chronic kidney disease: Secondary | ICD-10-CM | POA: Diagnosis not present

## 2023-06-23 DIAGNOSIS — Z471 Aftercare following joint replacement surgery: Secondary | ICD-10-CM | POA: Diagnosis not present

## 2023-06-23 DIAGNOSIS — I129 Hypertensive chronic kidney disease with stage 1 through stage 4 chronic kidney disease, or unspecified chronic kidney disease: Secondary | ICD-10-CM | POA: Diagnosis not present

## 2023-06-23 DIAGNOSIS — G4730 Sleep apnea, unspecified: Secondary | ICD-10-CM | POA: Diagnosis not present

## 2023-06-23 DIAGNOSIS — N181 Chronic kidney disease, stage 1: Secondary | ICD-10-CM | POA: Diagnosis not present

## 2023-06-23 DIAGNOSIS — Z7984 Long term (current) use of oral hypoglycemic drugs: Secondary | ICD-10-CM | POA: Diagnosis not present

## 2023-06-23 DIAGNOSIS — E785 Hyperlipidemia, unspecified: Secondary | ICD-10-CM | POA: Diagnosis not present

## 2023-06-23 DIAGNOSIS — Z7901 Long term (current) use of anticoagulants: Secondary | ICD-10-CM | POA: Diagnosis not present

## 2023-06-25 DIAGNOSIS — Z7984 Long term (current) use of oral hypoglycemic drugs: Secondary | ICD-10-CM | POA: Diagnosis not present

## 2023-06-25 DIAGNOSIS — N181 Chronic kidney disease, stage 1: Secondary | ICD-10-CM | POA: Diagnosis not present

## 2023-06-25 DIAGNOSIS — Z7901 Long term (current) use of anticoagulants: Secondary | ICD-10-CM | POA: Diagnosis not present

## 2023-06-25 DIAGNOSIS — Z471 Aftercare following joint replacement surgery: Secondary | ICD-10-CM | POA: Diagnosis not present

## 2023-06-25 DIAGNOSIS — E1122 Type 2 diabetes mellitus with diabetic chronic kidney disease: Secondary | ICD-10-CM | POA: Diagnosis not present

## 2023-06-25 DIAGNOSIS — E785 Hyperlipidemia, unspecified: Secondary | ICD-10-CM | POA: Diagnosis not present

## 2023-06-25 DIAGNOSIS — G4730 Sleep apnea, unspecified: Secondary | ICD-10-CM | POA: Diagnosis not present

## 2023-06-25 DIAGNOSIS — I129 Hypertensive chronic kidney disease with stage 1 through stage 4 chronic kidney disease, or unspecified chronic kidney disease: Secondary | ICD-10-CM | POA: Diagnosis not present

## 2023-06-25 DIAGNOSIS — I4891 Unspecified atrial fibrillation: Secondary | ICD-10-CM | POA: Diagnosis not present

## 2023-06-26 ENCOUNTER — Telehealth: Payer: Self-pay

## 2023-06-26 NOTE — Telephone Encounter (Signed)
PAP: Patient assistance application James Robbins for has been approved by PAP Companies: [employer] from 10/21/204 to 06/09/2024. Medication should be delivered to PAP Delivery: Home For further shipping updates, please contact [employer] ([employer]) at [phone] Pt ID is: [id number]   PLEASE BE ADVISED

## 2023-06-27 DIAGNOSIS — N181 Chronic kidney disease, stage 1: Secondary | ICD-10-CM | POA: Diagnosis not present

## 2023-06-27 DIAGNOSIS — E785 Hyperlipidemia, unspecified: Secondary | ICD-10-CM | POA: Diagnosis not present

## 2023-06-27 DIAGNOSIS — G4730 Sleep apnea, unspecified: Secondary | ICD-10-CM | POA: Diagnosis not present

## 2023-06-27 DIAGNOSIS — Z7901 Long term (current) use of anticoagulants: Secondary | ICD-10-CM | POA: Diagnosis not present

## 2023-06-27 DIAGNOSIS — E1122 Type 2 diabetes mellitus with diabetic chronic kidney disease: Secondary | ICD-10-CM | POA: Diagnosis not present

## 2023-06-27 DIAGNOSIS — Z471 Aftercare following joint replacement surgery: Secondary | ICD-10-CM | POA: Diagnosis not present

## 2023-06-27 DIAGNOSIS — I129 Hypertensive chronic kidney disease with stage 1 through stage 4 chronic kidney disease, or unspecified chronic kidney disease: Secondary | ICD-10-CM | POA: Diagnosis not present

## 2023-06-27 DIAGNOSIS — I4891 Unspecified atrial fibrillation: Secondary | ICD-10-CM | POA: Diagnosis not present

## 2023-06-27 DIAGNOSIS — Z7984 Long term (current) use of oral hypoglycemic drugs: Secondary | ICD-10-CM | POA: Diagnosis not present

## 2023-06-27 NOTE — Telephone Encounter (Signed)
Noted  

## 2023-07-01 ENCOUNTER — Encounter: Payer: Self-pay | Admitting: Physician Assistant

## 2023-07-01 ENCOUNTER — Encounter: Payer: Self-pay | Admitting: Physical Therapy

## 2023-07-01 ENCOUNTER — Ambulatory Visit: Payer: Medicare HMO | Admitting: Physical Therapy

## 2023-07-01 ENCOUNTER — Ambulatory Visit (INDEPENDENT_AMBULATORY_CARE_PROVIDER_SITE_OTHER): Payer: Medicare HMO | Admitting: Physician Assistant

## 2023-07-01 DIAGNOSIS — R600 Localized edema: Secondary | ICD-10-CM

## 2023-07-01 DIAGNOSIS — M6281 Muscle weakness (generalized): Secondary | ICD-10-CM

## 2023-07-01 DIAGNOSIS — M25562 Pain in left knee: Secondary | ICD-10-CM | POA: Diagnosis not present

## 2023-07-01 DIAGNOSIS — Z96652 Presence of left artificial knee joint: Secondary | ICD-10-CM

## 2023-07-01 DIAGNOSIS — R262 Difficulty in walking, not elsewhere classified: Secondary | ICD-10-CM

## 2023-07-01 MED ORDER — METHOCARBAMOL 750 MG PO TABS: 750.0000 mg | ORAL_TABLET | Freq: Two times a day (BID) | ORAL | 2 refills | Status: DC | PRN

## 2023-07-01 NOTE — Therapy (Signed)
OUTPATIENT PHYSICAL THERAPY LOWER EXTREMITY EVALUATION   Patient Name: James Robbins. MRN: [id number] DOB:1950/09/27, 73 y.o., male Today's Date: 07/01/2023  END OF SESSION:  PT End of Session - 07/01/23 1410     Visit Number 1    Number of Visits 24    Date for PT Re-Evaluation 09/09/23    Authorization Type humana    Authorization - Number of Visits 12    Progress Note Due on Visit 10    PT Start Time 1410    PT Stop Time 1500    PT Time Calculation (min) 50 min    Activity Tolerance Patient tolerated treatment well    Behavior During Therapy WFL for tasks assessed/performed             Past Medical History:  Diagnosis Date   Atrial fibrillation (HCC)    Chronic kidney disease (CKD), stage I    proteinuria   Controlled type 2 diabetes mellitus with diabetic nephropathy (HCC)    Completed DSME 2017   Hematuria 2014   s/p normal urological workup ([place])   Hyperlipidemia    Hypertension    Obesity    Seasonal allergic rhinitis    Sleep apnea    has cpap machine; don't use   Past Surgical History:  Procedure Laterality Date   CARDIOVERSION N/A 11/27/2020   Procedure: CARDIOVERSION;  Surgeon: Nahser, Deloris Ping, MD;  Location: [facility];  Service: Cardiovascular;  Laterality: N/A;   CATARACT EXTRACTION Bilateral 08/2021   COLONOSCOPY  06/10/2004   per pt normal ([place])   TOTAL KNEE ARTHROPLASTY Left 06/16/2023   Procedure: LEFT TOTAL KNEE ARTHROPLASTY;  Surgeon: Tarry Kos, MD;  Location: [facility] OR;  Service: Orthopedics;  Laterality: Left;   VASECTOMY     VASECTOMY REVERSAL     Patient Active Problem List   Diagnosis Date Noted   Primary osteoarthritis of left knee 06/16/2023   Status post total left knee replacement 06/16/2023   Hypercoagulable state due to permanent atrial fibrillation (HCC) 01/01/2023   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) 01/01/2022   Hypertensive retinopathy of both eyes 01/01/2022   Vision loss of right  eye 09/06/2021   Medicare annual wellness visit, subsequent 09/05/2021   Right retinal artery branch occlusion 04/05/2021   Obstructive sleep apnea hypopnea, severe 11/07/2020   Permanent atrial fibrillation (HCC) 11/07/2020   Primary osteoarthritis of knees, bilateral 04/23/2019   Vitamin D deficiency 09/23/2016   Advanced care planning/counseling discussion 07/20/2015   Health maintenance examination 02/21/2014   Severe obesity (BMI 35.0-39.9) with comorbidity (HCC)    Type 2 diabetes mellitus with microalbuminuria, without long-term current use of insulin (HCC)    Seasonal allergic rhinitis 12/24/2011   Hypertension 12/24/2011   Hyperlipidemia associated with type 2 diabetes mellitus (HCC) 12/24/2011    PCP: Eustaquio Boyden, MD   REFERRING PROVIDER: Tarry Kos, MD   REFERRING DIAG:  Diagnosis  [id number] (ICD-10-CM) - Status post total left knee replacement  M17.12 (ICD-10-CM) - Primary osteoarthritis of left knee    THERAPY DIAG:  Acute pain of left knee  Muscle weakness (generalized)  Difficulty in walking, not elsewhere classified  Localized edema  Rationale for Evaluation and Treatment: Rehabilitation  ONSET DATE: 06/16/23 surgery : left TKA  SUBJECTIVE:   SUBJECTIVE STATEMENT: Pt arriving to therapy s/p left TKA on 06/16/23. Pt reporting pain that can reach 8/10 and limit pt from sleeping. Pt stating he has worked hard to improve his knee flexion and  having trouble with extension. Pt reporting 6 HHPT visits.  Pt reporting he has been using his CPM at home.   PERTINENT HISTORY: Left TKA on 06/16/23 Cataract bil 2023 Cardioversion 11/27/20   PAIN:  NPRS scale: 3/10, worse is 8/10 Pain location: left knee Pain description: achy, throbbing Aggravating factors: trying to lift leg, straightening Relieving factors: pain meds  PRECAUTIONS: None  WEIGHT BEARING RESTRICTIONS: No  FALLS:  Has patient fallen in last 6 months? No  LIVING ENVIRONMENT: Lives  with: lives with their family and lives with their spouse Lives in: House/apartment Stairs: Yes: Internal: 3 flights steps; on right going up Has following equipment at home: Dan Humphreys - 2 wheeled  OCCUPATION: retired  PLOF: Independent  PATIENT GOALS: walk without walker   Next MD visit:   OBJECTIVE:   DIAGNOSTIC FINDINGS: nterval total left knee arthroplasty. No perihardware lucency is seen to indicate hardware failure or loosening. There is a small joint effusion. Expected postoperative intra-articular and anterior subcutaneous air. Interval decrease in now mild mineralization at the superior aspect of the tibial tubercle at the patellar tendon insertion, presumably from partial resection of the prior ossicle that likely represented the sequela of chronic Osgood-Schlatter disease. No acute fracture or dislocation.   IMPRESSION: Interval total left knee arthroplasty without evidence of hardware failure.  PATIENT SURVEYS:  07/01/23: FOTO intake:    43%   COGNITION: Overall cognitive status: WFL    SENSATION: WFL  EDEMA:  07/01/23: Circumferential: Left:  50 centimeters         Rt: 44.5 centimeters   POSTURE:  rounded shoulders, forward head, and decreased lumbar lordosis     LOWER EXTREMITY ROM:   ROM Right 07/01/23 Left 07/01/23  Hip flexion    Hip extension    Hip abduction    Hip adduction    Hip internal rotation    Hip external rotation    Knee flexion    Knee extension     (Blank rows = not tested)  LOWER EXTREMITY MMT:  MMT Right 07/01/23 Left 07/01/23  Hip flexion    Hip extension    Hip abduction    Hip adduction    Hip internal rotation    Hip external rotation    Knee flexion    Knee extension    Ankle dorsiflexion    Ankle plantarflexion     (Blank rows = not tested)    FUNCTIONAL TESTS:  07/01/23: 5 time sit to stand: 23.8 seconds c UE support  GAIT: Distance walked: clinic distances, level surfaces Assistive device  utilized: Environmental consultant - 2 wheeled Level of assistance: Modified independence Comments: antalgic gait, decreased wt shifting to left, decreased heel strike on left, decreased foot clearance, decreased left knee extension  TODAY'S TREATMENT                                                                          DATE:07/01/23 Therex: HEP instruction/performance c cues for techniques, handout provided.  Trial set performed of each for comprehension and symptom assessment.  See below for exercise list Modalities:  Vasopneumatic: 34 degrees, left LE elevated, medium compression x 10 minutes  PATIENT EDUCATION:  Education details: HEP, POC Person educated: Patient Education method: Explanation, Demonstration, Verbal cues, and Handouts Education comprehension: verbalized understanding, returned demonstration, and verbal cues required  HOME EXERCISE PROGRAM: Access Code: [email or web address] URL: [email or web address] Date: 07/01/2023 Prepared by: Narda Amber  Exercises - Sit to Stand with Counter Support  - 3 x daily - 7 x weekly - 10 reps - Seated Long Arc Quad  - 3 x daily - 7 x weekly - 2 sets - 10 reps - 3-5 seconds hold - Supine Active Straight Leg Raise  - 3 x daily - 7 x weekly - 2 sets - 10 reps - Supine Heel Slide with Strap  - 3 x daily - 7 x weekly - 2 sets - 10 reps - 3-5 seconds hold - Supine Quad Set on Towel Roll  - 3 x daily - 7 x weekly - 2 sets - 10 reps - 5 seconds hold  ASSESSMENT:  CLINICAL IMPRESSION: Patient is a 73 y.o. male who comes to clinic with complaints of left knee pain following TKA on 06/16/23. Pt presents with mobility, strength and movement coordination deficits that impair their ability to perform usual daily and recreational functional activities without increase difficulty/symptoms at this  time.  Patient to benefit from skilled PT services to address impairments and limitations to improve to previous level of function without restriction secondary to condition.   OBJECTIVE IMPAIRMENTS: decreased activity tolerance, decreased balance, decreased mobility, difficulty walking, decreased ROM, decreased strength, increased edema, impaired flexibility, and pain.   ACTIVITY LIMITATIONS: bending, sitting, standing, squatting, sleeping, stairs, transfers, bed mobility, bathing, toileting, and dressing  PARTICIPATION LIMITATIONS: meal prep, cleaning, driving, and community activity  PERSONAL FACTORS: 3+ comorbidities: see PMH above  are also affecting patient's functional outcome.   REHAB POTENTIAL: Good  CLINICAL DECISION MAKING: Stable/uncomplicated  EVALUATION COMPLEXITY: Low   GOALS: Goals reviewed with patient? Yes  SHORT TERM GOALS: (target date for Short term goals are 3 weeks 07/22/2023)   1.  Patient will demonstrate independent use of home exercise program to maintain progress from in clinic treatments.  Goal status: New  LONG TERM GOALS: (target dates for all long term goals are 10 weeks  09/09/2023 )   1. Patient will demonstrate/report pain at worst less than or equal to 2/10 to facilitate minimal limitation in daily activity secondary to pain symptoms.  Goal status: New   2. Patient will demonstrate independent use of home exercise program to facilitate ability to maintain/progress functional gains from skilled physical therapy services.  Goal status: New   3. Patient will demonstrate FOTO outcome > or = 55 % to indicate reduced disability due to condition.  Goal status: New   4.  Patient will demonstrate left  LE MMT >/= 4/5 throughout to faciltiate usual transfers, stairs, squatting at [facility]  for daily life.   Goal status: New   5.  Patient will demonstrate up and down a flight of stairs with single hand rail with reciprocal gait pattern.  Goal status:  New   6.  Pt will improve his left knee active ROM to  from 5 to 120 degrees for improved functional mobility and gait.  Goal status: New   7.  Pt will be able to stand without the use of his UE 5 times in </= 15 seconds to improve functional mobility.  Goal Status: New   PLAN:  PT FREQUENCY: 2-3x/week  PT DURATION: 10 weeks  PLANNED INTERVENTIONS: Can include 16109- PT Re-evaluation, 97110-Therapeutic exercises, 97530- Therapeutic activity, 97112- Neuromuscular re-education, 97535- Self Care, 97140- Manual therapy, [phone]- Gait training,  [phone]- Aquatic Therapy, 97014- Electrical stimulation (unattended) 91478- Vasopneumatic device,    Patient/Family education, Balance training, Stair training, Taping, Dry Needling, Joint mobilization, Joint manipulation, Spinal manipulation, Spinal mobilization, Scar mobilization, Vestibular training, Visual/preceptual remediation/compensation, DME instructions, Cryotherapy, and Moist heat.  All performed as medically necessary.  All included unless contraindicated  PLAN FOR NEXT SESSION: Review HEP knowledge/results, ROM, quad strengthening, bike, vaso    Sharmon Leyden, PT, MPT 07/01/2023, 3:03 PM   Referring diagnosis?  G95.621 (ICD-10-CM) - Status post total left knee replacement  M17.12 (ICD-10-CM) - Primary osteoarthritis of left knee   Treatment diagnosis? (if different than referring diagnosis) M25.562, M62.81, R26.2, R60.0  What was this (referring dx) caused by? [x]  Surgery []  Fall []  Ongoing issue []  Arthritis []  Other: ____________  Laterality: []  Rt [x]  Lt []  Both  Check all possible CPT codes: *CHOOSE 10 OR LESS* 97146- PT Re-evaluation, 97110-Therapeutic exercises, 97530- Therapeutic activity, [id number]- Neuromuscular re-education, 97535- Self Care, 30865- Manual therapy, [id number]- Gait training,  [id number]- Aquatic Therapy, 97014- Electrical stimulation (unattended) 97016- Vasopneumatic device,     See Planned Interventions  listed in the Plan section of the Evaluation.

## 2023-07-01 NOTE — Progress Notes (Signed)
Post-Op Visit Note   Patient: James Robbins.           Date of Birth: 1950-07-30           MRN: [id number] Visit Date: 07/01/2023 PCP: Eustaquio Boyden, MD   Assessment & Plan:  Chief Complaint:  Chief Complaint  Patient presents with   Left Knee - Follow-up    Left total knee arthroplasty 06/16/2023   Visit Diagnoses:  1. Status post total left knee replacement     Plan: Patient is a very pleasant 73 year old gentleman who comes in today 2 weeks status post left total knee replacement 06/16/2023.  He has been doing well.  He has been taking Robaxin and Tylenol for pain with occasional oxycodone at night.  He has been getting home health physical therapy and is ambulating with a walker.  He is on Eliquis for which she was taking prior to surgery.  Examination of the left knee reveals a well healing surgical incision with nylon sutures in place.  No evidence of infection or cellulitis.  Calf is soft nontender.  He is neurovascularly intact distally.  Today, sutures were removed and Steri-Strips applied.  I refilled his Robaxin.  He is scheduled to start physical therapy this afternoon.  Postop instructions provided to include dental prophylaxis.  He will follow-up with [place] in 4 weeks for repeat evaluation and 2 view x-rays of the left knee.  Call with concerns or questions.  Follow-Up Instructions: Return in about 4 weeks (around 07/29/2023).   Orders:  No orders of the defined types were placed in this encounter.  Meds ordered this encounter  Medications   methocarbamol (ROBAXIN-750) 750 MG tablet    Sig: Take 1 tablet (750 mg total) by mouth 2 (two) times daily as needed for muscle spasms.    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) 01/01/2022   Hypertensive retinopathy of both eyes 01/01/2022   Vision loss of right eye 09/06/2021   Medicare annual wellness visit, subsequent 09/05/2021   Right retinal artery branch occlusion 04/05/2021   Obstructive sleep apnea hypopnea, severe 11/07/2020   Permanent atrial fibrillation (HCC) 11/07/2020   Primary osteoarthritis of knees, bilateral 04/23/2019   Vitamin D deficiency 09/23/2016   Advanced care planning/counseling discussion 07/20/2015   Health maintenance examination 02/21/2014   Severe obesity (BMI 35.0-39.9) with comorbidity (HCC)    Type 2 diabetes mellitus with microalbuminuria, without long-term current use of insulin (HCC)    Seasonal allergic rhinitis 12/24/2011   Hypertension 12/24/2011   Hyperlipidemia associated with type 2 diabetes mellitus (HCC) 12/24/2011   Past Medical History:  Diagnosis Date   Atrial fibrillation (HCC)    Chronic kidney disease (CKD), stage I    proteinuria   Controlled type 2 diabetes mellitus with diabetic nephropathy (HCC)    Completed DSME 2017   Hematuria 2014   s/p normal urological workup ([place])   Hyperlipidemia    Hypertension    Obesity    Seasonal allergic rhinitis    Sleep apnea    has cpap machine; don't use    Family History  Problem Relation Age of Onset  Diabetes Mother    Hypertension Mother    Hyperlipidemia Mother    CAD Father        20v CABG, smoker   Osteoporosis Sister    Cancer Neg Hx     Past Surgical History:  Procedure Laterality Date   CARDIOVERSION N/A 11/27/2020   Procedure: CARDIOVERSION;  Surgeon: Nahser, Deloris Ping, MD;  Location: [facility]COPY;  Service: Cardiovascular;  Laterality: N/A;   CATARACT EXTRACTION Bilateral 08/2021   COLONOSCOPY  06/10/2004   per pt normal ([place])   TOTAL KNEE ARTHROPLASTY Left 06/16/2023   Procedure: LEFT TOTAL KNEE ARTHROPLASTY;  Surgeon: Tarry Kos, MD;   Location: [facility] OR;  Service: Orthopedics;  Laterality: Left;   VASECTOMY     VASECTOMY REVERSAL     Social History   Occupational History   Occupation: retired    Associate Professor: UNEMPLOYED    Comment: 2006  Tobacco Use   Smoking status: Former    Current packs/day: 0.00    Types: Cigarettes    Quit date: 06/09/1986    Years since quitting: 37.0   Smokeless tobacco: Never  Vaping Use   Vaping status: Never Used  Substance and Sexual Activity   Alcohol use: No    Alcohol/week: 0.0 standard drinks of alcohol   Drug use: No   Sexual activity: Not Currently

## 2023-07-02 ENCOUNTER — Encounter: Payer: Medicare HMO | Admitting: Physical Therapy

## 2023-07-03 ENCOUNTER — Encounter: Payer: Medicare HMO | Admitting: Physical Therapy

## 2023-07-04 ENCOUNTER — Encounter: Payer: Self-pay | Admitting: Rehabilitative and Restorative Service Providers"

## 2023-07-04 ENCOUNTER — Ambulatory Visit: Payer: Medicare HMO | Admitting: Rehabilitative and Restorative Service Providers"

## 2023-07-04 DIAGNOSIS — M25562 Pain in left knee: Secondary | ICD-10-CM

## 2023-07-04 DIAGNOSIS — R262 Difficulty in walking, not elsewhere classified: Secondary | ICD-10-CM | POA: Diagnosis not present

## 2023-07-04 DIAGNOSIS — R600 Localized edema: Secondary | ICD-10-CM | POA: Diagnosis not present

## 2023-07-04 DIAGNOSIS — M6281 Muscle weakness (generalized): Secondary | ICD-10-CM | POA: Diagnosis not present

## 2023-07-04 NOTE — Therapy (Signed)
OUTPATIENT PHYSICAL THERAPY LOWER EXTREMITY TREATMENT   Patient Name: James Robbins. MRN: [id number] DOB:05-31-1951, 73 y.o., male Today's Date: 07/04/2023  END OF SESSION:  PT End of Session - 07/04/23 1257     Visit Number 2    Number of Visits 24    Date for PT Re-Evaluation 09/09/23    Authorization Type humana    Authorization - Number of Visits 12    Progress Note Due on Visit 10    PT Start Time 1257    PT Stop Time 1349    PT Time Calculation (min) 52 min    Activity Tolerance Patient tolerated treatment well;No increased pain;Patient limited by fatigue;Patient limited by lethargy    Behavior During Therapy [facility] for tasks assessed/performed              Past Medical History:  Diagnosis Date   Atrial fibrillation (HCC)    Chronic kidney disease (CKD), stage I    proteinuria   Controlled type 2 diabetes mellitus with diabetic nephropathy (HCC)    Completed DSME 2017   Hematuria 2014   s/p normal urological workup (James Robbins)   Hyperlipidemia    Hypertension    Obesity    Seasonal allergic rhinitis    Sleep apnea    has cpap machine; don't use   Past Surgical History:  Procedure Laterality Date   CARDIOVERSION N/A 11/27/2020   Procedure: CARDIOVERSION;  Surgeon: James James Robbins James Ping, MD;  Location: [facility] ENDOSCOPY;  Service: Cardiovascular;  Laterality: N/A;   CATARACT EXTRACTION Bilateral 08/2021   COLONOSCOPY  06/10/2004   per pt normal ([place])   TOTAL KNEE ARTHROPLASTY Left 06/16/2023   Procedure: LEFT TOTAL KNEE ARTHROPLASTY;  Surgeon: James Kos, MD;  Location: [facility] OR;  Service: Orthopedics;  Laterality: Left;   VASECTOMY     VASECTOMY REVERSAL     Patient Active Problem List   Diagnosis Date Noted   Primary osteoarthritis of left knee 06/16/2023   Status post total left knee replacement 06/16/2023   Hypercoagulable state due to permanent atrial fibrillation (HCC) 01/01/2023   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) 01/01/2022    Hypertensive retinopathy of both eyes 01/01/2022   Vision loss of right eye 09/06/2021   Medicare annual wellness visit, subsequent 09/05/2021   Right retinal artery branch occlusion 04/05/2021   Obstructive sleep apnea hypopnea, severe 11/07/2020   Permanent atrial fibrillation (HCC) 11/07/2020   Primary osteoarthritis of knees, bilateral 04/23/2019   Vitamin D deficiency 09/23/2016   Advanced care planning/counseling discussion 07/20/2015   Health maintenance examination 02/21/2014   Severe obesity (BMI 35.0-39.9) with comorbidity (HCC)    Type 2 diabetes mellitus with microalbuminuria, without long-term current use of insulin (HCC)    Seasonal allergic rhinitis 12/24/2011   Hypertension 12/24/2011   Hyperlipidemia associated with type 2 diabetes mellitus (HCC) 12/24/2011    PCP: James Boyden, MD   REFERRING PROVIDER: Tarry Kos, MD   REFERRING DIAG:  Diagnosis  [id number] (ICD-10-CM) - Status post total left knee replacement  M17.12 (ICD-10-CM) - Primary osteoarthritis of left knee    THERAPY DIAG:  Acute pain of left knee  Muscle weakness (generalized)  Difficulty in walking, not elsewhere classified  Localized edema  Rationale for Evaluation and Treatment: Rehabilitation  ONSET DATE: 06/16/23 surgery : left TKA  SUBJECTIVE:   SUBJECTIVE STATEMENT: James Robbins is following-up with James Robbins 07/29/2023.  Sleep is about 2-3 hours uninterrupted.  He notes edema is a problem.  PERTINENT HISTORY:  Left TKA on 06/16/23 Cataract bil 2023 Cardioversion 11/27/20  PAIN:  NPRS scale: 3-6/10 this week Pain location: left knee Pain description: achy, throbbing Aggravating factors: trying to lift leg, straightening Relieving factors: 1250 mg tylenol in the AM, Oxy and muscle relaxer pain meds  PRECAUTIONS: None  WEIGHT BEARING RESTRICTIONS: No  FALLS:  Has patient fallen in last 6 months? No  LIVING ENVIRONMENT: Lives with: lives with their family and lives with their  spouse Lives in: House/apartment Stairs: Yes: Internal: 3 flights steps; on right going up Has following equipment at home: Dan Humphreys - 2 wheeled  OCCUPATION: retired  PLOF: Independent  PATIENT GOALS: walk without walker   Next MD visit:   OBJECTIVE:   DIAGNOSTIC FINDINGS: Interval total left knee arthroplasty. No perihardware lucency is seen to indicate hardware failure or loosening. There is a small joint effusion. Expected postoperative intra-articular and anterior subcutaneous air. Interval decrease in now mild mineralization at the superior aspect of the tibial tubercle at the patellar tendon insertion, presumably from partial resection of the prior ossicle that likely represented the sequela of chronic Osgood-Schlatter disease. No acute fracture or dislocation.   IMPRESSION: Interval total left knee arthroplasty without evidence of hardware failure.  PATIENT SURVEYS:  07/01/23: FOTO intake:    43%   COGNITION: Overall cognitive status: WFL    SENSATION: WFL  EDEMA:  07/01/23: Circumferential: Left:  50 centimeters         Rt: 44.5 centimeters   POSTURE:  rounded shoulders, forward head, and decreased lumbar lordosis     LOWER EXTREMITY ROM:   ROM Left 07/04/2023   Hip flexion    Hip extension    Hip abduction    Hip adduction    Hip internal rotation    Hip external rotation    Knee flexion 120   Knee extension -6    (Blank rows = not tested)  LOWER EXTREMITY MMT:  MMT Right 07/01/23 Left 07/01/23  Hip flexion    Hip extension    Hip abduction    Hip adduction    Hip internal rotation    Hip external rotation    Knee flexion    Knee extension    Ankle dorsiflexion    Ankle plantarflexion     (Blank rows = not tested)    FUNCTIONAL TESTS:  07/01/23: 5 time sit to stand: 23.8 seconds c UE support  GAIT: Distance walked: clinic distances, level surfaces Assistive device utilized: Environmental consultant - 2 wheeled Level of assistance: Modified  independence Comments: antalgic gait, decreased wt shifting to left, decreased heel strike on left, decreased foot clearance, decreased left knee extension  TODAY'S TREATMENT                                                                          DATE: 07/04/2023 Recumbent bike Seat 6 for 5 minutes full revolutions Resistance 3 Seated long arc quad 10 x 3 seconds Supine straight leg raises (Pause after quad set before lifting) 10 x 3 seconds Supine heel slide with strap and PT assist 10 x 5 seconds and quad set between reps 10 x 5 seconds Quadriceps sets with left heel prop 10 x 5 seconds  Functional Activities: Double Leg Press 50# slow eccentrics 10 x Sit to stand, no hands, slow eccentrics 5 x  Vaso Left knee 10 minutes Medium Pressure 36*   07/01/23 Therex: HEP instruction/performance c cues for techniques, handout provided.  Trial set performed of each for comprehension and symptom assessment.  See below for exercise list Modalities:  Vasopneumatic: 34 degrees, left LE elevated, medium compression x 10 minutes  PATIENT EDUCATION:  Education details: HEP, POC Person educated: Patient Education method: Explanation, Demonstration, Verbal cues, and Handouts Education comprehension: verbalized understanding, returned demonstration, and verbal cues required  HOME EXERCISE PROGRAM: Access Code: [email or web address] URL: [email or web address] Date: 07/01/2023 Prepared by: Narda Amber  Exercises - Sit to Stand with Counter Support  - 3 x daily - 7 x weekly - 10 reps - Seated Long Arc Quad  - 3 x daily - 7 x weekly - 2 sets - 10 reps - 3-5 seconds hold - Supine Active Straight Leg Raise  - 3 x daily - 7 x weekly - 2 sets - 10 reps - Supine Heel Slide with Strap  - 3 x daily - 7 x weekly - 2 sets - 10 reps - 3-5  seconds hold - Supine Quad Set on Towel Roll  - 3 x daily - 7 x weekly - 2 sets - 10 reps - 5 seconds hold  ASSESSMENT:  CLINICAL IMPRESSION: James Robbins had AROM at 6 - 0 - 120 degrees today, excellent for < 3 weeks post left TKA.  Quadriceps strength, extension AROM and edema control remain high priorities with his home and clinic program.  We discussed maybe trying PT without the oxy next time as Jane was very sleepy today.  OBJECTIVE IMPAIRMENTS: decreased activity tolerance, decreased balance, decreased mobility, difficulty walking, decreased ROM, decreased strength, increased edema, impaired flexibility, and pain.   ACTIVITY LIMITATIONS: bending, sitting, standing, squatting, sleeping, stairs, transfers, bed mobility, bathing, toileting, and dressing  PARTICIPATION LIMITATIONS: meal prep, cleaning, driving, and community activity  PERSONAL FACTORS: 3+ comorbidities: see PMH above  are also affecting patient's functional outcome.   REHAB POTENTIAL: Good  CLINICAL DECISION MAKING: Stable/uncomplicated  EVALUATION COMPLEXITY: Low   GOALS: Goals reviewed with patient? Yes  SHORT TERM GOALS: (target date for Short term goals are 3 weeks 07/22/2023)   1.  Patient will demonstrate independent use of home exercise program to maintain progress from in clinic treatments.  Goal status: On Going 07/04/2023  LONG TERM GOALS: (target dates for all long term goals are 10 weeks  09/09/2023 )   1. Patient will demonstrate/report pain at worst less than or equal to 2/10 to facilitate minimal limitation in daily activity secondary to pain  symptoms.  Goal status: New   2. Patient will demonstrate independent use of home exercise program to facilitate ability to maintain/progress functional gains from skilled physical therapy services.  Goal status: New   3. Patient will demonstrate FOTO outcome > or = 55 % to indicate reduced disability due to condition.  Goal status: New   4.  Patient will  demonstrate left  LE MMT >/= 4/5 throughout to faciltiate usual transfers, stairs, squatting at [facility] for daily life.   Goal status: New   5.  Patient will demonstrate up and down a flight of stairs with single hand rail with reciprocal gait pattern.  Goal status: New   6.  Pt will improve his left knee active ROM to  from 5 to 120 degrees for improved functional mobility and gait.  Goal status: New   7.  Pt will be able to stand without the use of his UE 5 times in </= 15 seconds to improve functional mobility.  Goal Status: New   PLAN:  PT FREQUENCY: 2-3x/week  PT DURATION: 10 weeks  PLANNED INTERVENTIONS: Can include 96045- PT Re-evaluation, 97110-Therapeutic exercises, 97530- Therapeutic activity, 97112- Neuromuscular re-education, 97535- Self Care, 97140- Manual therapy, [phone]- Gait training,  [phone]- Aquatic Therapy, 97014- Electrical stimulation (unattended) 82956- Vasopneumatic device,    Patient/Family education, Balance training, Stair training, Taping, Dry Needling, Joint mobilization, Joint manipulation, Spinal manipulation, Spinal mobilization, Scar mobilization, Vestibular training, Visual/preceptual remediation/compensation, DME instructions, Cryotherapy, and Moist heat.  All performed as medically necessary.  All included unless contraindicated  PLAN FOR NEXT SESSION: Review HEP knowledge/results, extension ROM, quadriceps strengthening, bike, vaso    Ollen Bowl [place], PT, MPT 07/04/2023, 1:47 PM   Referring diagnosis?  O13.086 (ICD-10-CM) - Status post total left knee replacement  M17.12 (ICD-10-CM) - Primary osteoarthritis of left knee   Treatment diagnosis? (if different than referring diagnosis) M25.562, M62.81, R26.2, R60.0  What was this (referring dx) caused by? [x]  Surgery []  Fall []  Ongoing issue []  Arthritis []  Other: ____________  Laterality: []  Rt [x]  Lt []  Both  Check all possible CPT codes: *CHOOSE 10 OR LESS* 97146- PT Re-evaluation,  97110-Therapeutic exercises, 97530- Therapeutic activity, [id number]- Neuromuscular re-education, 97535- Self Care, 57846- Manual therapy, [id number]- Gait training,  [id number]- Aquatic Therapy, 97014- Electrical stimulation (unattended) 97016- Vasopneumatic device,     See Planned Interventions listed in the Plan section of the Evaluation.

## 2023-07-09 ENCOUNTER — Ambulatory Visit: Payer: Medicare HMO | Admitting: Rehabilitative and Restorative Service Providers"

## 2023-07-09 ENCOUNTER — Encounter: Payer: Self-pay | Admitting: Rehabilitative and Restorative Service Providers"

## 2023-07-09 DIAGNOSIS — R600 Localized edema: Secondary | ICD-10-CM

## 2023-07-09 DIAGNOSIS — R262 Difficulty in walking, not elsewhere classified: Secondary | ICD-10-CM | POA: Diagnosis not present

## 2023-07-09 DIAGNOSIS — M6281 Muscle weakness (generalized): Secondary | ICD-10-CM | POA: Diagnosis not present

## 2023-07-09 DIAGNOSIS — M25562 Pain in left knee: Secondary | ICD-10-CM | POA: Diagnosis not present

## 2023-07-09 NOTE — Therapy (Signed)
OUTPATIENT PHYSICAL THERAPY LOWER EXTREMITY TREATMENT   Patient Name: James Robbins. MRN: [id number] DOB:November 22, 1950, 73 y.o., male Today's Date: 07/09/2023  END OF SESSION:  PT End of Session - 07/09/23 1229     Visit Number 3    Number of Visits 24    Date for PT Re-Evaluation 09/09/23    Authorization Type humana    Authorization - Number of Visits 12    Progress Note Due on Visit 10    PT Start Time 1147    PT Stop Time 1239    PT Time Calculation (min) 52 min    Equipment Utilized During Treatment Gait belt    Activity Tolerance Patient tolerated treatment well;No increased pain;Patient limited by fatigue;Patient limited by lethargy    Behavior During Therapy [facility] for tasks assessed/performed               Past Medical History:  Diagnosis Date   Atrial fibrillation (HCC)    Chronic kidney disease (CKD), stage I    proteinuria   Controlled type 2 diabetes mellitus with diabetic nephropathy (HCC)    Completed DSME 2017   Hematuria 2014   s/p normal urological workup ([place])   Hyperlipidemia    Hypertension    Obesity    Seasonal allergic rhinitis    Sleep apnea    has cpap machine; don't use   Past Surgical History:  Procedure Laterality Date   CARDIOVERSION N/A 11/27/2020   Procedure: CARDIOVERSION;  Surgeon: Elease Hashimoto Deloris Ping, MD;  Location: [facility] ENDOSCOPY;  Service: Cardiovascular;  Laterality: N/A;   CATARACT EXTRACTION Bilateral 08/2021   COLONOSCOPY  06/10/2004   per pt normal ([place])   TOTAL KNEE ARTHROPLASTY Left 06/16/2023   Procedure: LEFT TOTAL KNEE ARTHROPLASTY;  Surgeon: Tarry Kos, MD;  Location: [facility] OR;  Service: Orthopedics;  Laterality: Left;   VASECTOMY     VASECTOMY REVERSAL     Patient Active Problem List   Diagnosis Date Noted   Primary osteoarthritis of left knee 06/16/2023   Status post total left knee replacement 06/16/2023   Hypercoagulable state due to permanent atrial fibrillation (HCC) 01/01/2023   Mild nonproliferative  diabetic retinopathy of left eye (HCC) 01/01/2022   Hypertensive retinopathy of both eyes 01/01/2022   Vision loss of right eye 09/06/2021   Medicare annual wellness visit, subsequent 09/05/2021   Right retinal artery branch occlusion 04/05/2021   Obstructive sleep apnea hypopnea, severe 11/07/2020   Permanent atrial fibrillation (HCC) 11/07/2020   Primary osteoarthritis of knees, bilateral 04/23/2019   Vitamin D deficiency 09/23/2016   Advanced care planning/counseling discussion 07/20/2015   Health maintenance examination 02/21/2014   Severe obesity (BMI 35.0-39.9) with comorbidity (HCC)    Type 2 diabetes mellitus with microalbuminuria, without long-term current use of insulin (HCC)    Seasonal allergic rhinitis 12/24/2011   Hypertension 12/24/2011   Hyperlipidemia associated with type 2 diabetes mellitus (HCC) 12/24/2011    PCP: Eustaquio Boyden, MD   REFERRING PROVIDER: Tarry Kos, MD   REFERRING DIAG:  Diagnosis  [phone] (ICD-10-CM) - Status post total left knee replacement  M17.12 (ICD-10-CM) - Primary osteoarthritis of left knee    THERAPY DIAG:  Acute pain of left knee  Muscle weakness (generalized)  Difficulty in walking, not elsewhere classified  Localized edema  Rationale for Evaluation and Treatment: Rehabilitation  ONSET DATE: 06/16/23 surgery : left TKA  SUBJECTIVE:   SUBJECTIVE STATEMENT: Nicholis notes good HEP compliance over the past 5 days.  He is following-up  with Dr. Roda Shutters 07/29/2023.  Sleep is still about 2-3 hours uninterrupted.  He notes edema is a problem.  PERTINENT HISTORY: Left TKA on 06/16/23 Cataract bil 2023 Cardioversion 11/27/20  PAIN:  NPRS scale: 3-6/10 since his last PT visit, similar to last week Pain location: left knee Pain description: achy, throbbing Aggravating factors: trying to lift leg, straightening Relieving factors: 1250 mg tylenol in the AM, Oxy and muscle relaxer pain meds before bed mostly  PRECAUTIONS:  None  WEIGHT BEARING RESTRICTIONS: No  FALLS:  Has patient fallen in last 6 months? No  LIVING ENVIRONMENT: Lives with: lives with their family and lives with their spouse Lives in: House/apartment Stairs: Yes: Internal: 3 flights steps; on right going up Has following equipment at home: Dan Humphreys - 2 wheeled  OCCUPATION: retired  PLOF: Independent  PATIENT GOALS: walk without walker   Next MD visit:   OBJECTIVE:   DIAGNOSTIC FINDINGS: Interval total left knee arthroplasty. No perihardware lucency is seen to indicate hardware failure or loosening. There is a small joint effusion. Expected postoperative intra-articular and anterior subcutaneous air. Interval decrease in now mild mineralization at the superior aspect of the tibial tubercle at the patellar tendon insertion, presumably from partial resection of the prior ossicle that likely represented the sequela of chronic Osgood-Schlatter disease. No acute fracture or dislocation.   IMPRESSION: Interval total left knee arthroplasty without evidence of hardware failure.  PATIENT SURVEYS:  07/01/23: FOTO intake:    43%  COGNITION: Overall cognitive status: WFL    SENSATION: WFL  EDEMA:  07/01/23: Circumferential: Left:  50 centimeters         Rt: 44.5 centimeters   POSTURE:  rounded shoulders, forward head, and decreased lumbar lordosis   LOWER EXTREMITY ROM:   ROM Left 07/04/2023 Left 07/09/2023  Hip flexion    Hip extension    Hip abduction    Hip adduction    Hip internal rotation    Hip external rotation    Knee flexion 120 120  Knee extension -6 -5   (Blank rows = not tested)  LOWER EXTREMITY MMT:  MMT Right 07/01/23 Left 07/01/23  Hip flexion    Hip extension    Hip abduction    Hip adduction    Hip internal rotation    Hip external rotation    Knee flexion    Knee extension    Ankle dorsiflexion    Ankle plantarflexion     (Blank rows = not tested)    FUNCTIONAL TESTS:  07/01/23: 5  time sit to stand: 23.8 seconds c UE support  GAIT: Distance walked: clinic distances, level surfaces Assistive device utilized: Environmental consultant - 2 wheeled Level of assistance: Modified independence Comments: antalgic gait, decreased wt shifting to left, decreased heel strike on left, decreased foot clearance, decreased left knee extension  TODAY'S TREATMENT                                                                          DATE: 07/09/2023 Recumbent bike Seat 6 for 5 minutes full revolutions Resistance 4 Quadriceps sets with left heel prop 2 sets of 10 x 5 seconds  Functional Activities: Double Leg Press 75# slow eccentrics 10 x  Neuromuscular re-education: Tandem balance eyes open, head turning and on foam 3 x 20 seconds each Slow marching on foam (small steps) 10 x  Vaso Left knee 10 minutes Medium Pressure 36*   07/04/2023 Recumbent bike Seat 6 for 5 minutes full revolutions Resistance 3 Seated long arc quad 10 x 3 seconds Supine straight leg raises (Pause after quad set before lifting) 10 x 3 seconds Supine heel slide with strap and PT assist 10 x 5 seconds and quad set between reps 10 x 5 seconds Quadriceps sets with left heel prop 10 x 5 seconds  Functional Activities: Double Leg Press 50# slow eccentrics 10 x Sit to stand, no hands, slow eccentrics 5 x  Vaso Left knee 10 minutes Medium Pressure 36*   07/01/23 Therex: HEP instruction/performance c cues for techniques, handout provided.  Trial set performed of each for comprehension and symptom assessment.  See below for exercise list Modalities:  Vasopneumatic: 34 degrees, left LE elevated, medium compression x 10 minutes  PATIENT EDUCATION:  Education details: HEP, POC Person educated: Patient Education method: Explanation, Demonstration, Verbal cues,  and Handouts Education comprehension: verbalized understanding, returned demonstration, and verbal cues required  HOME EXERCISE PROGRAM: Access Code: [email or web address] URL: [email or web address] Date: 07/01/2023 Prepared by: Narda Amber  Exercises - Sit to Stand with Counter Support  - 3 x daily - 7 x weekly - 10 reps - Seated Long Arc Quad  - 3 x daily - 7 x weekly - 2 sets - 10 reps - 3-5 seconds hold - Supine Active Straight Leg Raise  - 3 x daily - 7 x weekly - 2 sets - 10 reps - Supine Heel Slide with Strap  - 3 x daily - 7 x weekly - 2 sets - 10 reps - 3-5 seconds hold - Supine Quad Set on Towel Roll  - 3 x daily - 7 x weekly - 2 sets - 10 reps - 5 seconds hold  ASSESSMENT:  CLINICAL IMPRESSION: Easten had AROM at 5 - 0 - 120 degrees today, excellent for just over 3 weeks post left TKA.  Quadriceps strength, extension AROM, gait quality, balance and edema control remain high priorities with his home and clinic program.  Jaelyn was less sleepy today without the oxycodone.  OBJECTIVE IMPAIRMENTS: decreased activity tolerance, decreased balance, decreased mobility, difficulty walking, decreased ROM, decreased strength, increased edema, impaired flexibility, and pain.   ACTIVITY LIMITATIONS: bending, sitting, standing, squatting, sleeping, stairs, transfers, bed mobility, bathing, toileting, and dressing  PARTICIPATION LIMITATIONS: meal prep, cleaning, driving, and community activity  PERSONAL FACTORS: 3+ comorbidities: see PMH above  are also affecting patient's functional outcome.   REHAB POTENTIAL: Good  CLINICAL DECISION MAKING: Stable/uncomplicated  EVALUATION COMPLEXITY: Low   GOALS: Goals reviewed with patient? Yes  SHORT TERM GOALS: (target date for Short term goals are 3 weeks 07/22/2023)  1.  Patient will demonstrate independent use of home exercise program to maintain progress from in clinic treatments.  Goal status: Partially Met (improve compliance)  07/09/2023  LONG TERM GOALS: (target dates for all long term goals are 10 weeks  09/09/2023 )   1. Patient will demonstrate/report pain at worst less than or equal to 2/10 to facilitate minimal limitation in daily activity secondary to pain symptoms.  Goal status: New   2. Patient will demonstrate independent use of home exercise program to facilitate ability to maintain/progress functional gains from skilled physical therapy services.  Goal status: New   3. Patient will demonstrate FOTO outcome > or = 55 % to indicate reduced disability due to condition.  Goal status: New   4.  Patient will demonstrate left  LE MMT >/= 4/5 throughout to faciltiate usual transfers, stairs, squatting at [facility] for daily life.   Goal status: New   5.  Patient will demonstrate up and down a flight of stairs with single hand rail with reciprocal gait pattern.  Goal status: New   6.  Pt will improve his left knee active ROM to  from 5 to 120 degrees for improved functional mobility and gait.  Goal status: New   7.  Pt will be able to stand without the use of his UE 5 times in </= 15 seconds to improve functional mobility.  Goal Status: New   PLAN:  PT FREQUENCY: 2-3x/week  PT DURATION: 10 weeks  PLANNED INTERVENTIONS: Can include 16109- PT Re-evaluation, 97110-Therapeutic exercises, 97530- Therapeutic activity, 97112- Neuromuscular re-education, 97535- Self Care, 97140- Manual therapy, [phone]- Gait training,  [phone]- Aquatic Therapy, 97014- Electrical stimulation (unattended) 91478- Vasopneumatic device,    Patient/Family education, Balance training, Stair training, Taping, Dry Needling, Joint mobilization, Joint manipulation, Spinal manipulation, Spinal mobilization, Scar mobilization, Vestibular training, Visual/preceptual remediation/compensation, DME instructions, Cryotherapy, and Moist heat.  All performed as medically necessary.  All included unless contraindicated  PLAN FOR NEXT SESSION: Review  HEP knowledge/results, extension ROM, quadriceps strengthening, balance, gait, functional activities, bike, vaso    Ollen Bowl [place], PT, MPT 07/09/2023, 12:36 PM   Referring diagnosis?  G95.621 (ICD-10-CM) - Status post total left knee replacement  M17.12 (ICD-10-CM) - Primary osteoarthritis of left knee   Treatment diagnosis? (if different than referring diagnosis) M25.562, M62.81, R26.2, R60.0  What was this (referring dx) caused by? [x]  Surgery []  Fall []  Ongoing issue []  Arthritis []  Other: ____________  Laterality: []  Rt [x]  Lt []  Both  Check all possible CPT codes: *CHOOSE 10 OR LESS* 97146- PT Re-evaluation, 97110-Therapeutic exercises, 97530- Therapeutic activity, [id number]- Neuromuscular re-education, 97535- Self Care, 30865- Manual therapy, [id number]- Gait training,  [id number]- Aquatic Therapy, 97014- Electrical stimulation (unattended) 97016- Vasopneumatic device,     See Planned Interventions listed in the Plan section of the Evaluation.

## 2023-07-10 ENCOUNTER — Encounter: Payer: Self-pay | Admitting: Rehabilitative and Restorative Service Providers"

## 2023-07-10 ENCOUNTER — Ambulatory Visit: Payer: Medicare HMO | Admitting: Rehabilitative and Restorative Service Providers"

## 2023-07-10 DIAGNOSIS — M25562 Pain in left knee: Secondary | ICD-10-CM

## 2023-07-10 DIAGNOSIS — M6281 Muscle weakness (generalized): Secondary | ICD-10-CM

## 2023-07-10 DIAGNOSIS — R262 Difficulty in walking, not elsewhere classified: Secondary | ICD-10-CM | POA: Diagnosis not present

## 2023-07-10 DIAGNOSIS — R600 Localized edema: Secondary | ICD-10-CM

## 2023-07-10 NOTE — Therapy (Signed)
OUTPATIENT PHYSICAL THERAPY LOWER EXTREMITY TREATMENT   Patient Name: James Robbins. MRN: [id number] DOB:Oct 28, 1950, 73 y.o., male Today's Date: 07/10/2023  END OF SESSION:  PT End of Session - 07/10/23 1439     Visit Number 4    Number of Visits 24    Date for PT Re-Evaluation 09/09/23    Authorization Type humana    Authorization - Number of Visits 12    Progress Note Due on Visit 10    PT Start Time 1431    PT Stop Time 1512    PT Time Calculation (min) 41 min    Equipment Utilized During Treatment Gait belt    Activity Tolerance Patient tolerated treatment well;No increased pain    Behavior During Therapy WFL for tasks assessed/performed                Past Medical History:  Diagnosis Date   Atrial fibrillation (HCC)    Chronic kidney disease (CKD), stage I    proteinuria   Controlled type 2 diabetes mellitus with diabetic nephropathy (HCC)    Completed DSME 2017   Hematuria 2014   s/p normal urological workup ([place])   Hyperlipidemia    Hypertension    Obesity    Seasonal allergic rhinitis    Sleep apnea    has cpap machine; don't use   Past Surgical History:  Procedure Laterality Date   CARDIOVERSION N/A 11/27/2020   Procedure: CARDIOVERSION;  Surgeon: Nahser, Deloris Ping, MD;  Location: [facility] ENDOSCOPY;  Service: Cardiovascular;  Laterality: N/A;   CATARACT EXTRACTION Bilateral 08/2021   COLONOSCOPY  06/10/2004   per pt normal ([place])   TOTAL KNEE ARTHROPLASTY Left 06/16/2023   Procedure: LEFT TOTAL KNEE ARTHROPLASTY;  Surgeon: Tarry Kos, MD;  Location: [facility] OR;  Service: Orthopedics;  Laterality: Left;   VASECTOMY     VASECTOMY REVERSAL     Patient Active Problem List   Diagnosis Date Noted   Primary osteoarthritis of left knee 06/16/2023   Status post total left knee replacement 06/16/2023   Hypercoagulable state due to permanent atrial fibrillation (HCC) 01/01/2023   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) 01/01/2022    Hypertensive retinopathy of both eyes 01/01/2022   Vision loss of right eye 09/06/2021   Medicare annual wellness visit, subsequent 09/05/2021   Right retinal artery branch occlusion 04/05/2021   Obstructive sleep apnea hypopnea, severe 11/07/2020   Permanent atrial fibrillation (HCC) 11/07/2020   Primary osteoarthritis of knees, bilateral 04/23/2019   Vitamin D deficiency 09/23/2016   Advanced care planning/counseling discussion 07/20/2015   Health maintenance examination 02/21/2014   Severe obesity (BMI 35.0-39.9) with comorbidity (HCC)    Type 2 diabetes mellitus with microalbuminuria, without long-term current use of insulin (HCC)    Seasonal allergic rhinitis 12/24/2011   Hypertension 12/24/2011   Hyperlipidemia associated with type 2 diabetes mellitus (HCC) 12/24/2011    PCP: Eustaquio Boyden, MD   REFERRING PROVIDER: Tarry Kos, MD   REFERRING DIAG:  Diagnosis  [phone] (ICD-10-CM) - Status post total left knee replacement  M17.12 (ICD-10-CM) - Primary osteoarthritis of left knee    THERAPY DIAG:  Acute pain of left knee  Muscle weakness (generalized)  Localized edema  Difficulty in walking, not elsewhere classified  Rationale for Evaluation and Treatment: Rehabilitation  ONSET DATE: 06/16/23 surgery : left TKA  SUBJECTIVE:   SUBJECTIVE STATEMENT: Doing well, pain has not been an issue and states he could take a nap no issue. Has been  doing HEP states that they are going "ok".  PERTINENT HISTORY: Left TKA on 06/16/23 Cataract bil 2023 Cardioversion 11/27/20  PAIN:  NPRS scale: 4/10, does not get worse than that.  Pain location: left knee Pain description: achy, throbbing Aggravating factors: trying to lift leg, straightening Relieving factors: 1250 mg tylenol in the AM, Oxy and muscle relaxer pain meds before bed mostly  PRECAUTIONS: None  WEIGHT BEARING RESTRICTIONS: No  FALLS:  Has patient fallen in last 6 months? No  LIVING ENVIRONMENT: Lives  with: lives with their family and lives with their spouse Lives in: House/apartment Stairs: Yes: Internal: 3 flights steps; on right going up Has following equipment at home: Dan Humphreys - 2 wheeled  OCCUPATION: retired  PLOF: Independent  PATIENT GOALS: walk without walker   Next MD visit:   OBJECTIVE:   DIAGNOSTIC FINDINGS: Interval total left knee arthroplasty. No perihardware lucency is seen to indicate hardware failure or loosening. There is a small joint effusion. Expected postoperative intra-articular and anterior subcutaneous air. Interval decrease in now mild mineralization at the superior aspect of the tibial tubercle at the patellar tendon insertion, presumably from partial resection of the prior ossicle that likely represented the sequela of chronic Osgood-Schlatter disease. No acute fracture or dislocation.   IMPRESSION: Interval total left knee arthroplasty without evidence of hardware failure.  PATIENT SURVEYS:  07/01/23: FOTO intake:    43%  COGNITION: Overall cognitive status: WFL    SENSATION: WFL  EDEMA:  07/01/23: Circumferential: Left:  50 centimeters         Rt: 44.5 centimeters   POSTURE:  rounded shoulders, forward head, and decreased lumbar lordosis   LOWER EXTREMITY ROM:   ROM Left 07/04/2023 Left 07/09/2023  Hip flexion    Hip extension    Hip abduction    Hip adduction    Hip internal rotation    Hip external rotation    Knee flexion 120 120  Knee extension -6 -5   (Blank rows = not tested)  LOWER EXTREMITY MMT:  MMT Right 07/01/23 Left 07/01/23  Hip flexion    Hip extension    Hip abduction    Hip adduction    Hip internal rotation    Hip external rotation    Knee flexion    Knee extension    Ankle dorsiflexion    Ankle plantarflexion     (Blank rows = not tested)    FUNCTIONAL TESTS:  07/01/23: 5 time sit to stand: 23.8 seconds c UE support  GAIT: Distance walked: clinic distances, level surfaces Assistive device  utilized: Environmental consultant - 2 wheeled Level of assistance: Modified independence Comments: antalgic gait, decreased wt shifting to left, decreased heel strike on left, decreased foot clearance, decreased left knee extension  TODAY'S TREATMENT                                                                          DATE: 07/09/2022 Therex: Seated long arc quad 10 x 3 seconds Supine straight leg raises x10 with 3-5sec hold; VC for quad set before lifting leg Supine heel slide with strap and PT assist 10x5 sec hold Supine quad sets 10x5sec hold with heel prop BLE leg press 50# slow eccentrics x10 Sit to stand x10; no UE, short break after 6 due to fatigue  Manual: Knee extension with overpressure; patient guarding knee and requires breaks for knee flexion due to sensitivity to extension. Patella glides all directions; tolerated well   Declined vaso  07/09/2023 Recumbent bike Seat 6 for 5 minutes full revolutions Resistance 4 Quadriceps sets with left heel prop 2 sets of 10 x 5 seconds  Functional Activities: Double Leg Press 75# slow eccentrics 10 x  Neuromuscular re-education: Tandem balance eyes open, head turning and on foam 3 x 20 seconds each Slow marching on foam (small steps) 10 x  Vaso Left knee 10 minutes Medium Pressure 36*   07/04/2023 Recumbent bike Seat 6 for 5 minutes full revolutions Resistance 3 Seated long arc quad 10 x 3 seconds Supine straight leg raises (Pause after quad set before lifting) 10 x 3 seconds Supine heel slide with strap and PT assist 10 x 5 seconds and quad set between reps 10 x 5 seconds Quadriceps sets with left heel prop 10 x 5 seconds  Functional Activities: Double Leg Press 50# slow eccentrics 10 x Sit to stand, no hands, slow eccentrics 5 x  Vaso Left knee 10 minutes Medium Pressure  36*   07/01/23 Therex: HEP instruction/performance c cues for techniques, handout provided.  Trial set performed of each for comprehension and symptom assessment.  See below for exercise list Modalities:  Vasopneumatic: 34 degrees, left LE elevated, medium compression x 10 minutes  PATIENT EDUCATION:  Education details: HEP, POC Person educated: Patient Education method: Explanation, Demonstration, Verbal cues, and Handouts Education comprehension: verbalized understanding, returned demonstration, and verbal cues required  HOME EXERCISE PROGRAM: Access Code: [email or web address] URL: [email or web address] Date: 07/01/2023 Prepared by: Narda Amber  Exercises - Sit to Stand with Counter Support  - 3 x daily - 7 x weekly - 10 reps - Seated Long Arc Quad  - 3 x daily - 7 x weekly - 2 sets - 10 reps - 3-5 seconds hold - Supine Active Straight Leg Raise  - 3 x daily - 7 x weekly - 2 sets - 10 reps - Supine Heel Slide with Strap  - 3 x daily - 7 x weekly - 2 sets - 10 reps - 3-5 seconds hold - Supine Quad Set on Towel Roll  - 3 x daily - 7 x weekly - 2 sets - 10 reps - 5 seconds hold  ASSESSMENT:  CLINICAL IMPRESSION:  Patient continues good flexion ROM however extension is still a challenge as he is unable to actively extend his knee completely and is sensitive to overpressure applied manually by therapist. Patient deficits reflected in gait mechanics as he ambulates with flexed knee throughout. Patient continues to make progress and will benefit from continued  skilled physical therapy to address strength, ROM, and mobility deficits.   OBJECTIVE IMPAIRMENTS: decreased activity tolerance, decreased balance, decreased mobility, difficulty walking, decreased ROM, decreased strength, increased edema, impaired flexibility, and pain.   ACTIVITY LIMITATIONS: bending, sitting, standing, squatting, sleeping, stairs, transfers, bed mobility, bathing, toileting, and dressing  PARTICIPATION  LIMITATIONS: meal prep, cleaning, driving, and community activity  PERSONAL FACTORS: 3+ comorbidities: see PMH above  are also affecting patient's functional outcome.   REHAB POTENTIAL: Good  CLINICAL DECISION MAKING: Stable/uncomplicated  EVALUATION COMPLEXITY: Low   GOALS: Goals reviewed with patient? Yes  SHORT TERM GOALS: (target date for Short term goals are 3 weeks 07/22/2023)   1.  Patient will demonstrate independent use of home exercise program to maintain progress from in clinic treatments.  Goal status: Partially Met (improve compliance) 07/09/2023  LONG TERM GOALS: (target dates for all long term goals are 10 weeks  09/09/2023 )   1. Patient will demonstrate/report pain at worst less than or equal to 2/10 to facilitate minimal limitation in daily activity secondary to pain symptoms.  Goal status: New   2. Patient will demonstrate independent use of home exercise program to facilitate ability to maintain/progress functional gains from skilled physical therapy services.  Goal status: New   3. Patient will demonstrate FOTO outcome > or = 55 % to indicate reduced disability due to condition.  Goal status: New   4.  Patient will demonstrate left  LE MMT >/= 4/5 throughout to faciltiate usual transfers, stairs, squatting at [facility] for daily life.   Goal status: New   5.  Patient will demonstrate up and down a flight of stairs with single hand rail with reciprocal gait pattern.  Goal status: New   6.  Pt will improve his left knee active ROM to  from 5 to 120 degrees for improved functional mobility and gait.  Goal status: New   7.  Pt will be able to stand without the use of his UE 5 times in </= 15 seconds to improve functional mobility.  Goal Status: New   PLAN:  PT FREQUENCY: 2-3x/week  PT DURATION: 10 weeks  PLANNED INTERVENTIONS: Can include 10272- PT Re-evaluation, 97110-Therapeutic exercises, 97530- Therapeutic activity, 97112- Neuromuscular re-education,  97535- Self Care, 97140- Manual therapy, [phone]- Gait training,  [phone]- Aquatic Therapy, 97014- Electrical stimulation (unattended) 42595- Vasopneumatic device,   Patient/Family education, Balance training, Stair training, Taping, Dry Needling, Joint mobilization, Joint manipulation, Spinal manipulation, Spinal mobilization, Scar mobilization, Vestibular training, Visual/preceptual remediation/compensation, DME instructions, Cryotherapy, and Moist heat.  All performed as medically necessary.  All included unless contraindicated  PLAN FOR NEXT SESSION: emphasize extension ROM and activity to encourage/promote extension, gait- progress to cane if able otherwise encourage heel strike when ambulating with RW  Jaelyn Bourgoin, Mariell Nester, Student-PT 07/10/2023, 4:24 PM   Referring diagnosis?  G38.756 (ICD-10-CM) - Status post total left knee replacement  M17.12 (ICD-10-CM) - Primary osteoarthritis of left knee   Treatment diagnosis? (if different than referring diagnosis) M25.562, M62.81, R26.2, R60.0  What was this (referring dx) caused by? [x]  Surgery []  Fall []  Ongoing issue []  Arthritis []  Other: ____________  Laterality: []  Rt [x]  Lt []  Both  Check all possible CPT codes: *CHOOSE 10 OR LESS* 97146- PT Re-evaluation, 97110-Therapeutic exercises, 97530- Therapeutic activity, [id number]- Neuromuscular re-education, 97535- Self Care, 43329- Manual therapy, [id number]- Gait training,  [id number]- Aquatic Therapy, 97014- Electrical stimulation (unattended) 97016- Vasopneumatic device,     See Planned Interventions listed in the Plan section of the Evaluation.

## 2023-07-15 ENCOUNTER — Ambulatory Visit: Payer: Medicare HMO | Admitting: Rehabilitative and Restorative Service Providers"

## 2023-07-15 ENCOUNTER — Encounter: Payer: Self-pay | Admitting: Rehabilitative and Restorative Service Providers"

## 2023-07-15 DIAGNOSIS — M25562 Pain in left knee: Secondary | ICD-10-CM | POA: Diagnosis not present

## 2023-07-15 DIAGNOSIS — R600 Localized edema: Secondary | ICD-10-CM | POA: Diagnosis not present

## 2023-07-15 DIAGNOSIS — R262 Difficulty in walking, not elsewhere classified: Secondary | ICD-10-CM

## 2023-07-15 DIAGNOSIS — M6281 Muscle weakness (generalized): Secondary | ICD-10-CM | POA: Diagnosis not present

## 2023-07-15 NOTE — Therapy (Signed)
 OUTPATIENT PHYSICAL THERAPY LOWER EXTREMITY TREATMENT   Patient Name: James Robbins. MRN: [id number] DOB:1951/02/03, 73 y.o., male Today's Date: 07/15/2023  END OF SESSION:  PT End of Session - 07/15/23 1648     Visit Number 5    Number of Visits 24    Date for PT Re-Evaluation 09/09/23    Authorization Type humana    Authorization - Number of Visits 12    Progress Note Due on Visit 10    PT Start Time 1600    PT Stop Time 1643    PT Time Calculation (min) 43 min    Equipment Utilized During Treatment Gait belt    Activity Tolerance Patient tolerated treatment well;No increased pain    Behavior During Therapy WFL for tasks assessed/performed                 Past Medical History:  Diagnosis Date   Atrial fibrillation (HCC)    Chronic kidney disease (CKD), stage I    proteinuria   Controlled type 2 diabetes mellitus with diabetic nephropathy (HCC)    Completed DSME 2017   Hematuria 2014   s/p normal urological workup ([place])   Hyperlipidemia    Hypertension    Obesity    Seasonal allergic rhinitis    Sleep apnea    has cpap machine; don't use   Past Surgical History:  Procedure Laterality Date   CARDIOVERSION N/A 11/27/2020   Procedure: CARDIOVERSION;  Surgeon: Nahser, Aleene PARAS, MD;  Location: [facility] END[facility]COPY;  Service: Cardiovascular;  Laterality: N/A;   CATARACT EXTRACTION Bilateral 08/2021   COLONOSCOPY  06/10/2004   per pt normal ([place])   TOTAL KNEE ARTHROPLASTY Left 06/16/2023   Procedure: LEFT TOTAL KNEE ARTHROPLASTY;  Surgeon: Jerri Kay HERO, MD;  Location: [facility] OR;  Service: Orthopedics;  Laterality: Left;   VASECTOMY     VASECTOMY REVERSAL     Patient Active Problem List   Diagnosis Date Noted   Primary osteoarthritis of left knee 06/16/2023   Status post total left knee replacement 06/16/2023   Hypercoagulable state due to permanent atrial fibrillation (HCC) 01/01/2023   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) 01/01/2022    Hypertensive retinopathy of both eyes 01/01/2022   Vision loss of right eye 09/06/2021   Medicare annual wellness visit, subsequent 09/05/2021   Right retinal artery branch occlusion 04/05/2021   Obstructive sleep apnea hypopnea, severe 11/07/2020   Permanent atrial fibrillation (HCC) 11/07/2020   Primary osteoarthritis of knees, bilateral 04/23/2019   Vitamin D  deficiency 09/23/2016   Advanced care planning/counseling discussion 07/20/2015   Health maintenance examination 02/21/2014   Severe obesity (BMI 35.0-39.9) with comorbidity (HCC)    Type 2 diabetes mellitus with microalbuminuria, without long-term current use of insulin  (HCC)    Seasonal allergic rhinitis 12/24/2011   Hypertension 12/24/2011   Hyperlipidemia associated with type 2 diabetes mellitus (HCC) 12/24/2011    PCP: Rilla Baller, MD   REFERRING PROVIDER: Jerri Kay HERO, MD   REFERRING DIAG:  Diagnosis  [phone] (ICD-10-CM) - Status post total left knee replacement  M17.12 (ICD-10-CM) - Primary osteoarthritis of left knee    THERAPY DIAG:  Acute pain of left knee  Muscle weakness (generalized)  Localized edema  Difficulty in walking, not elsewhere classified  Rationale for Evaluation and Treatment: Rehabilitation  ONSET DATE: 06/16/23 surgery : left TKA  SUBJECTIVE:   SUBJECTIVE STATEMENT: States he feels pretty good because his knee feels better since last time. Has been doing his exercises  without issue. Has been taking 2 tylenol  to reduce pain so he is able to sleep; has been trying to reduce the instances of taking oxy. Arrived using stand alone cane, no RW.  PERTINENT HISTORY: Left TKA on 06/16/23 Cataract bil 2023 Cardioversion 11/27/20  PAIN:  NPRS scale: 2/10, does not get worse than that.  Pain location: left knee Pain description: achy, throbbing Aggravating factors: trying to lift leg, straightening Relieving factors: 1250 mg tylenol  in the AM, Oxy and muscle relaxer pain meds before  bed mostly  PRECAUTIONS: None  WEIGHT BEARING RESTRICTIONS: No  FALLS:  Has patient fallen in last 6 months? No  LIVING ENVIRONMENT: Lives with: lives with their family and lives with their spouse Lives in: House/apartment Stairs: Yes: Internal: 3 flights steps; on right going up Has following equipment at home: Vannie - 2 wheeled  OCCUPATION: retired  PLOF: Independent  PATIENT GOALS: walk without walker   Next MD visit:   OBJECTIVE:   DIAGNOSTIC FINDINGS: Interval total left knee arthroplasty. No perihardware lucency is seen to indicate hardware failure or loosening. There is a small joint effusion. Expected postoperative intra-articular and anterior subcutaneous air. Interval decrease in now mild mineralization at the superior aspect of the tibial tubercle at the patellar tendon insertion, presumably from partial resection of the prior ossicle that likely represented the sequela of chronic Osgood-Schlatter disease. No acute fracture or dislocation.   IMPRESSION: Interval total left knee arthroplasty without evidence of hardware failure.  PATIENT SURVEYS:  07/01/23: FOTO intake:    43%  COGNITION: Overall cognitive status: WFL    SENSATION: WFL  EDEMA:  07/01/23: Circumferential: Left:  50 centimeters         Rt: 44.5 centimeters   POSTURE:  rounded shoulders, forward head, and decreased lumbar lordosis   LOWER EXTREMITY ROM:   ROM Left 07/04/2023 Left 07/09/2023  Hip flexion    Hip extension    Hip abduction    Hip adduction    Hip internal rotation    Hip external rotation    Knee flexion 120 120  Knee extension -6 -5   (Blank rows = not tested)  LOWER EXTREMITY MMT:  MMT Right 07/01/23 Left 07/01/23  Hip flexion    Hip extension    Hip abduction    Hip adduction    Hip internal rotation    Hip external rotation    Knee flexion    Knee extension    Ankle dorsiflexion    Ankle plantarflexion     (Blank rows = not  tested)    FUNCTIONAL TESTS:  07/01/23: 5 time sit to stand: 23.8 seconds c UE support  GAIT: Distance walked: clinic distances, level surfaces Assistive device utilized: [employer] - 2 wheeled Level of assistance: Modified independence Comments: antalgic gait, decreased wt shifting to left, decreased heel strike on left, decreased foot clearance, decreased left knee extension  TODAY'S TREATMENT                                                                          DATE: 07/15/2023 Therex: Recumbent bike Seat 6 for 5 minutes full revolutions Resistance 5 BLE leg press 50# slow eccentrics 2x10; vc for proper LE placement SL leg press 2x10 ea 25#; VC for proper positioning  Prone knee hangs for improving knee extension passively  Theract: Sit to stand 18in chair x10; demonstrated shift to RLE when rising and lowering to chair, educated on proper weight shift to prevent overuse of RLE.  Gait: -Fwd walking 48ft with SPC CGA, emphasis on heel contact and small step length; use of gait belt due to unsteadiness on feet 1 LOB with increased therapist assist for patient to steady/self right -Bwd walking 69ft with SPC CGA. Emphasis on toe to heel sequence for increased knee extension.  Manual: Seated knee flexion with overpressure; tolerated well Supine knee extension with overpressure; patient guarding knee preventing overpressure from being applied fully, VC and TC for relaxation of hamstrings  Patella glides all directions; tolerated well   Declined vaso  07/09/2022 Therex: Seated long arc quad 10 x 3 seconds Supine straight leg raises x10 with 3-5sec hold; VC for quad set before lifting leg Supine heel slide with strap and PT assist 10x5 sec hold Supine quad sets 10x5sec hold with heel prop BLE leg press 50# slow  eccentrics x10 Sit to stand x10; no UE, short break after 6 due to fatigue  Manual: Knee extension with overpressure; patient guarding knee and requires breaks for knee flexion due to sensitivity to extension. Patella glides all directions; tolerated well   Declined vaso  07/09/2023 Recumbent bike Seat 6 for 5 minutes full revolutions Resistance 4 Quadriceps sets with left heel prop 2 sets of 10 x 5 seconds  Functional Activities: Double Leg Press 75# slow eccentrics 10 x  Neuromuscular re-education: Tandem balance eyes open, head turning and on foam 3 x 20 seconds each Slow marching on foam (small steps) 10 x  Vaso Left knee 10 minutes Medium Pressure 36*   07/04/2023 Recumbent bike Seat 6 for 5 minutes full revolutions Resistance 3 Seated long arc quad 10 x 3 seconds Supine straight leg raises (Pause after quad set before lifting) 10 x 3 seconds Supine heel slide with strap and PT assist 10 x 5 seconds and quad set between reps 10 x 5 seconds Quadriceps sets with left heel prop 10 x 5 seconds  Functional Activities: Double Leg Press 50# slow eccentrics 10 x Sit to stand, no hands, slow eccentrics 5 x  Vaso Left knee 10 minutes Medium Pressure 36*   07/01/23 Therex: HEP instruction/performance c cues for techniques, handout provided.  Trial set performed of each for comprehension and symptom assessment.  See below for exercise list Modalities:  Vasopneumatic: 34 degrees, left LE elevated, medium compression x 10 minutes  PATIENT EDUCATION:  Education details: HEP, POC Person educated: Patient Education method: Explanation, Demonstration, Verbal cues, and Handouts Education comprehension: verbalized understanding, returned demonstration, and verbal cues required  HOME EXERCISE PROGRAM: Access Code: [email or web address] URL: [email or web address] Date: 07/01/2023 Prepared by: Delon Lunger  Exercises - Sit to Stand with Counter Support  -  3 x daily - 7 x weekly  - 10 reps - Seated Long Arc Quad  - 3 x daily - 7 x weekly - 2 sets - 10 reps - 3-5 seconds hold - Supine Active Straight Leg Raise  - 3 x daily - 7 x weekly - 2 sets - 10 reps - Supine Heel Slide with Strap  - 3 x daily - 7 x weekly - 2 sets - 10 reps - 3-5 seconds hold - Supine Quad Set on Towel Roll  - 3 x daily - 7 x weekly - 2 sets - 10 reps - 5 seconds hold  ASSESSMENT:  CLINICAL IMPRESSION:  Patient arrived to session with cane use however demonstrated decreased safety when ambulating at one point requiring therapist assist to remain upright. Patient educated on use of SPC in home and RW for community distances. To target patient's lack of knee extension, prone knee hangs were done with patient able to tolerate activity more than attempts at manual overpressure. Patient will require additional education and practice with SPC use as gait mechanics are poor due to lack of extension. Patient will continue to benefit from skilled physical therapy to address deficits.   OBJECTIVE IMPAIRMENTS: decreased activity tolerance, decreased balance, decreased mobility, difficulty walking, decreased ROM, decreased strength, increased edema, impaired flexibility, and pain.   ACTIVITY LIMITATIONS: bending, sitting, standing, squatting, sleeping, stairs, transfers, bed mobility, bathing, toileting, and dressing  PARTICIPATION LIMITATIONS: meal prep, cleaning, driving, and community activity  PERSONAL FACTORS: 3+ comorbidities: see PMH above  are also affecting patient's functional outcome.   REHAB POTENTIAL: Good  CLINICAL DECISION MAKING: Stable/uncomplicated  EVALUATION COMPLEXITY: Low   GOALS: Goals reviewed with patient? Yes  SHORT TERM GOALS: (target date for Short term goals are 3 weeks 07/22/2023)   1.  Patient will demonstrate independent use of home exercise program to maintain progress from in clinic treatments.  Goal status: Partially Met (improve compliance) 07/09/2023  LONG TERM  GOALS: (target dates for all long term goals are 10 weeks  09/09/2023 )   1. Patient will demonstrate/report pain at worst less than or equal to 2/10 to facilitate minimal limitation in daily activity secondary to pain symptoms.  Goal status: New   2. Patient will demonstrate independent use of home exercise program to facilitate ability to maintain/progress functional gains from skilled physical therapy services.  Goal status: New   3. Patient will demonstrate FOTO outcome > or = 55 % to indicate reduced disability due to condition.  Goal status: New   4.  Patient will demonstrate left  LE MMT >/= 4/5 throughout to faciltiate usual transfers, stairs, squatting at [facility] for daily life.   Goal status: New   5.  Patient will demonstrate up and down a flight of stairs with single hand rail with reciprocal gait pattern.  Goal status: New   6.  Pt will improve his left knee active ROM to  from 5 to 120 degrees for improved functional mobility and gait.  Goal status: New   7.  Pt will be able to stand without the use of his UE 5 times in </= 15 seconds to improve functional mobility.  Goal Status: New   PLAN:  PT FREQUENCY: 2-3x/week  PT DURATION: 10 weeks  PLANNED INTERVENTIONS: Can include 02853- PT Re-evaluation, 97110-Therapeutic exercises, 97530- Therapeutic activity, [id number]- Neuromuscular re-education, 97535- Self Care, 97140- Manual therapy, [id number]- Gait training,  [phone]- Aquatic Therapy, 97014- Electrical stimulation (unattended) 02983- Vasopneumatic device,   Patient/Family education,  Balance training, Stair training, Taping, Dry Needling, Joint mobilization, Joint manipulation, Spinal manipulation, Spinal mobilization, Scar mobilization, Vestibular training, Visual/preceptual remediation/compensation, DME instructions, Cryotherapy, and Moist heat.  All performed as medically necessary.  All included unless contraindicated  PLAN FOR NEXT SESSION: emphasize extension ROM and  activity to encourage/promote extension, gait- progress to cane if able otherwise encourage heel strike when ambulating with RW  Isaiah Cianci, Lenwood Balsam, Student-PT 07/15/2023, 4:49 PM   Referring diagnosis?  S03.347 (ICD-10-CM) - Status post total left knee replacement  M17.12 (ICD-10-CM) - Primary osteoarthritis of left knee   Treatment diagnosis? (if different than referring diagnosis) M25.562, M62.81, R26.2, R60.0  What was this (referring dx) caused by? [x]  Surgery []  Fall []  Ongoing issue []  Arthritis []  Other: ____________  Laterality: []  Rt [x]  Lt []  Both  Check all possible CPT codes: *CHOOSE 10 OR LESS* 97146- PT Re-evaluation, 97110-Therapeutic exercises, 97530- Therapeutic activity, [id number]- Neuromuscular re-education, 97535- Self Care, 02859- Manual therapy, [id number]- Gait training,  [id number]- Aquatic Therapy, 97014- Electrical stimulation (unattended) 97016- Vasopneumatic device,     See Planned Interventions listed in the Plan section of the Evaluation.

## 2023-07-16 ENCOUNTER — Ambulatory Visit: Payer: Medicare HMO | Admitting: Rehabilitative and Restorative Service Providers"

## 2023-07-16 ENCOUNTER — Encounter: Payer: Self-pay | Admitting: Rehabilitative and Restorative Service Providers"

## 2023-07-16 DIAGNOSIS — R600 Localized edema: Secondary | ICD-10-CM | POA: Diagnosis not present

## 2023-07-16 DIAGNOSIS — M6281 Muscle weakness (generalized): Secondary | ICD-10-CM

## 2023-07-16 DIAGNOSIS — R262 Difficulty in walking, not elsewhere classified: Secondary | ICD-10-CM

## 2023-07-16 DIAGNOSIS — M25562 Pain in left knee: Secondary | ICD-10-CM

## 2023-07-16 NOTE — Therapy (Signed)
 OUTPATIENT PHYSICAL THERAPY  TREATMENT   Patient Name: James Robbins. MRN: [id number] DOB:[birth year], 73 y.o., male Today's Date: 07/16/2023  END OF SESSION:  PT End of Session - 07/16/23 1521     Visit Number 6    Number of Visits 24    Date for PT Re-Evaluation 09/09/23    Authorization Type humana    Authorization - Visit Number 6    Authorization - Number of Visits 12    Progress Note Due on Visit 10    PT Start Time 1503    PT Stop Time 1600    PT Time Calculation (min) 57 min    Equipment Utilized During Treatment --    Activity Tolerance Patient tolerated treatment well    Behavior During Therapy WFL for tasks assessed/performed                  Past Medical History:  Diagnosis Date   Atrial fibrillation (HCC)    Chronic kidney disease (CKD), stage I    proteinuria   Controlled type 2 diabetes mellitus with diabetic nephropathy (HCC)    Completed DSME 2017   Hematuria 2014   s/p normal urological workup ([place])   Hyperlipidemia    Hypertension    Obesity    Seasonal allergic rhinitis    Sleep apnea    has cpap machine; don't use   Past Surgical History:  Procedure Laterality Date   CARDIOVERSION N/A 11/27/2020   Procedure: CARDIOVERSION;  Surgeon: Nahser, Aleene PARAS, MD;  Location: [facility] ENDOSCOPY;  Service: Cardiovascular;  Laterality: N/A;   CATARACT EXTRACTION Bilateral 08/2021   COLONOSCOPY  06/10/2004   per pt normal ([place])   TOTAL KNEE ARTHROPLASTY Left 06/16/2023   Procedure: LEFT TOTAL KNEE ARTHROPLASTY;  Surgeon: Jerri Kay HERO, MD;  Location: [facility] OR;  Service: Orthopedics;  Laterality: Left;   VASECTOMY     VASECTOMY REVERSAL     Patient Active Problem List   Diagnosis Date Noted   Primary osteoarthritis of left knee 06/16/2023   Status post total left knee replacement 06/16/2023   Hypercoagulable state due to permanent atrial fibrillation (HCC) 01/01/2023   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) 01/01/2022    Hypertensive retinopathy of both eyes 01/01/2022   Vision loss of right eye 09/06/2021   Medicare annual wellness visit, subsequent 09/05/2021   Right retinal artery branch occlusion 04/05/2021   Obstructive sleep apnea hypopnea, severe 11/07/2020   Permanent atrial fibrillation (HCC) 11/07/2020   Primary osteoarthritis of knees, bilateral 04/23/2019   Vitamin D  deficiency 09/23/2016   Advanced care planning/counseling discussion 07/20/2015   Health maintenance examination 02/21/2014   Severe obesity (BMI 35.0-39.9) with comorbidity (HCC)    Type 2 diabetes mellitus with microalbuminuria, without long-term current use of insulin  (HCC)    Seasonal allergic rhinitis 12/24/2011   Hypertension 12/24/2011   Hyperlipidemia associated with type 2 diabetes mellitus (HCC) 12/24/2011    PCP: Rilla Baller, MD   REFERRING PROVIDER: Jerri Kay HERO, MD   REFERRING DIAG:  Diagnosis  [phone] (ICD-10-CM) - Status post total left knee replacement  M17.12 (ICD-10-CM) - Primary osteoarthritis of left knee    THERAPY DIAG:  Acute pain of left knee  Muscle weakness (generalized)  Localized edema  Difficulty in walking, not elsewhere classified  Rationale for Evaluation and Treatment: Rehabilitation  ONSET DATE: 06/16/23 surgery : left TKA  SUBJECTIVE:   SUBJECTIVE STATEMENT: Pt indicated having some pain 2/10 earlier when doing bike and exercise.  Icing/rest took care of it. Pt indicated trying some walking at home without the cane (despite talk in clinic last time about using assistive device).   PERTINENT HISTORY: Left TKA on 06/16/23 Cataract bil 2023 Cardioversion 11/27/20  PAIN:  NPRS scale: 2/10, does not get worse than that.  Pain location: left knee Pain description: achy, throbbing Aggravating factors: trying to lift leg, straightening Relieving factors: 1250 mg tylenol  in the AM, Oxy and muscle relaxer pain meds before bed mostly  PRECAUTIONS: None  WEIGHT BEARING  RESTRICTIONS: No  FALLS:  Has patient fallen in last 6 months? No  LIVING ENVIRONMENT: Lives with: lives with their family and lives with their spouse Lives in: House/apartment Stairs: Yes: Internal: 3 flights steps; on right going up Has following equipment at home: Vannie - 2 wheeled  OCCUPATION: retired  PLOF: Independent  PATIENT GOALS: walk without walker   Next MD visit:   OBJECTIVE:   DIAGNOSTIC FINDINGS: Interval total left knee arthroplasty. No perihardware lucency is seen to indicate hardware failure or loosening. There is a small joint effusion. Expected postoperative intra-articular and anterior subcutaneous air. Interval decrease in now mild mineralization at the superior aspect of the tibial tubercle at the patellar tendon insertion, presumably from partial resection of the prior ossicle that likely represented the sequela of chronic Osgood-Schlatter disease. No acute fracture or dislocation.   IMPRESSION: Interval total left knee arthroplasty without evidence of hardware failure.  PATIENT SURVEYS:  07/01/23: FOTO intake:    43%  COGNITION: 07/04/2023 Overall cognitive status: WFL    SENSATION: 07/04/2023 WFL  EDEMA:  07/01/23: Circumferential: Left:  50 centimeters         Rt: 44.5 centimeters   POSTURE:  07/04/2023: rounded shoulders, forward head, and decreased lumbar lordosis   LOWER EXTREMITY ROM:   ROM Left 07/04/2023 Left 07/09/2023  Hip flexion    Hip extension    Hip abduction    Hip adduction    Hip internal rotation    Hip external rotation    Knee flexion 120 120  Knee extension -6 -5   (Blank rows = not tested)  LOWER EXTREMITY MMT:  MMT Right 07/01/23 Left 07/01/23  Hip flexion    Hip extension    Hip abduction    Hip adduction    Hip internal rotation    Hip external rotation    Knee flexion    Knee extension    Ankle dorsiflexion    Ankle plantarflexion     (Blank rows = not tested)  FUNCTIONAL TESTS:   07/01/23: 5 time sit to stand: 23.8 seconds c UE support  GAIT: 07/16/2023:  SPC use in clinic, using Rt hand.  Reduced stance on Lt leg with reduced heel contact and toe off progression. Impacts Rt leg step length negatively   Eval: Distance walked: clinic distances, level surfaces Assistive device utilized: Walker - 2 wheeled Level of assistance: Modified independence Comments: antalgic gait, decreased wt shifting to left, decreased heel strike on left, decreased foot clearance, decreased left knee extension  TODAY'S TREATMENT                                                                          DATE: 07/16/2023 Therex: UBE Lvl 3.0 for ROM 8 mins LE only Incilne gastroc 30 sec x 5 bilateral  Seated Lt leg LAQ 3 lbs 2 x 15   Education and review of TKE stretch in supine or sitting to promote TKE extension. Performed trial with start of vaso time.  Verbal education provided during vaso treatment time to improve knowledge.   Stretch performed to tolerance with vaso  Neuro Re-ed Tandem stance 30 sec x 4 performed bilaterally in // bars with moderate HHA at times Seated quad set 5 sec hold x 15 for neuro muscular recruitment.  Additional time spent for cues verbally and visually.    TherActivity Step up forward Lt leg with Rt leg posterior step off and Rt hand on bar  2 x 10  Cues for review of navigation of stairs in community or home with good leg up and surgery leg down to continue to improve safety.    Education given on continued use of SPC in home environment to promote safety and stability in ambulation.   Vaso 10 mins Lt leg 40 deg medium compression in elevation with TKE exercise at start.     TODAY'S TREATMENT                                                                          DATE:  07/15/2023 Therex: Recumbent bike Seat 6 for 5 minutes full revolutions Resistance 5 BLE leg press 50# slow eccentrics 2x10; vc for proper LE placement SL leg press 2x10 ea 25#; VC for proper positioning  Prone knee hangs for improving knee extension passively  Theract: Sit to stand 18in chair x10; demonstrated shift to RLE when rising and lowering to chair, educated on proper weight shift to prevent overuse of RLE.  Gait: -Fwd walking 52ft with SPC CGA, emphasis on heel contact and small step length; use of gait belt due to unsteadiness on feet 1 LOB with increased therapist assist for patient to steady/self right -Bwd walking 2ft with SPC CGA. Emphasis on toe to heel sequence for increased knee extension.  Manual: Seated knee flexion with overpressure; tolerated well Supine knee extension with overpressure; patient guarding knee preventing overpressure from being applied fully, VC and TC for relaxation of hamstrings  Patella glides all directions; tolerated well   Declined vaso  07/09/2022 Therex: Seated long arc quad 10 x 3 seconds Supine straight leg raises x10 with 3-5sec hold; VC for quad set before lifting leg Supine heel slide with strap and PT assist 10x5 sec hold Supine quad sets 10x5sec hold with heel prop BLE leg press 50# slow eccentrics x10 Sit to stand x10; no UE, short break after 6 due to fatigue  Manual: Knee extension with overpressure; patient guarding knee and requires breaks for knee flexion  due to sensitivity to extension. Patella glides all directions; tolerated well   Declined vaso    PATIENT EDUCATION:  Education details: HEP, POC Person educated: Patient Education method: Explanation, Demonstration, Verbal cues, and Handouts Education comprehension: verbalized understanding, returned demonstration, and verbal cues required  HOME EXERCISE PROGRAM: Access Code: [email or web address] URL: [email or web address] Date: 07/01/2023 Prepared by:  Delon Lunger  Exercises - Sit to Stand with Counter Support  - 3 x daily - 7 x weekly - 10 reps - Seated Long Arc Quad  - 3 x daily - 7 x weekly - 2 sets - 10 reps - 3-5 seconds hold - Supine Active Straight Leg Raise  - 3 x daily - 7 x weekly - 2 sets - 10 reps - Supine Heel Slide with Strap  - 3 x daily - 7 x weekly - 2 sets - 10 reps - 3-5 seconds hold - Supine Quad Set on Towel Roll  - 3 x daily - 7 x weekly - 2 sets - 10 reps - 5 seconds hold  ASSESSMENT:  CLINICAL IMPRESSION:  Continual cues required in intervention for improved techniques and procedures and safety.  Continued emphasis on use of SPC for ambulation even in house for safety.  Continued skilled PT services indicated to progress extension mobility, strength and balance improvements.   OBJECTIVE IMPAIRMENTS: decreased activity tolerance, decreased balance, decreased mobility, difficulty walking, decreased ROM, decreased strength, increased edema, impaired flexibility, and pain.   ACTIVITY LIMITATIONS: bending, sitting, standing, squatting, sleeping, stairs, transfers, bed mobility, bathing, toileting, and dressing  PARTICIPATION LIMITATIONS: meal prep, cleaning, driving, and community activity  PERSONAL FACTORS: 3+ comorbidities: see PMH above  are also affecting patient's functional outcome.   REHAB POTENTIAL: Good  CLINICAL DECISION MAKING: Stable/uncomplicated  EVALUATION COMPLEXITY: Low   GOALS: Goals reviewed with patient? Yes  SHORT TERM GOALS: (target date for Short term goals are 3 weeks 07/22/2023)   1.  Patient will demonstrate independent use of home exercise program to maintain progress from in clinic treatments.  Goal status: Partially Met (improve compliance) 07/09/2023  LONG TERM GOALS: (target dates for all long term goals are 10 weeks  09/09/2023 )   1. Patient will demonstrate/report pain at worst less than or equal to 2/10 to facilitate minimal limitation in daily activity secondary to pain  symptoms.  Goal status: New   2. Patient will demonstrate independent use of home exercise program to facilitate ability to maintain/progress functional gains from skilled physical therapy services.  Goal status: New   3. Patient will demonstrate FOTO outcome > or = 55 % to indicate reduced disability due to condition.  Goal status: New   4.  Patient will demonstrate left  LE MMT >/= 4/5 throughout to faciltiate usual transfers, stairs, squatting at [facility] [place] for daily life.   Goal status: New   5.  Patient will demonstrate up and down a flight of stairs with single hand rail with reciprocal gait pattern.  Goal status: New   6.  Pt will improve his left knee active ROM to  from 5 to 120 degrees for improved functional mobility and gait.  Goal status: New   7.  Pt will be able to stand without the use of his UE 5 times in </= 15 seconds to improve functional mobility.  Goal Status: New   PLAN:  PT FREQUENCY: 2-3x/week  PT DURATION: 10 weeks  PLANNED INTERVENTIONS: Can include 02853- PT Re-evaluation, 97110-Therapeutic exercises, 97530- Therapeutic activity, [id number]- Neuromuscular re-education, 97535- Self Care,  02859- Manual therapy, [id number]- Gait training,  [phone]- Aquatic Therapy, [phone]- Electrical stimulation (unattended) 02983- Vasopneumatic device,   Patient/Family education, Balance training, Stair training, Taping, Dry Needling, Joint mobilization, Joint manipulation, Spinal manipulation, Spinal mobilization, Scar mobilization, Vestibular training, Visual/preceptual remediation/compensation, DME instructions, Cryotherapy, and Moist heat.  All performed as medically necessary.  All included unless contraindicated  PLAN FOR NEXT SESSION: Recheck of knee ROM/ MMT   Ozell Silvan, PT, DPT, OCS, ATC 07/16/23  3:56 PM     Referring diagnosis?  S03.347 (ICD-10-CM) - Status post total left knee replacement  M17.12 (ICD-10-CM) - Primary osteoarthritis of left knee   Treatment  diagnosis? (if different than referring diagnosis) M25.562, M62.81, R26.2, R60.0  What was this (referring dx) caused by? [x]  Surgery []  Fall []  Ongoing issue []  Arthritis []  Other: ____________  Laterality: []  Rt [x]  Lt []  Both  Check all possible CPT codes: *CHOOSE 10 OR LESS* 97146- PT Re-evaluation, 97110-Therapeutic exercises, 97530- Therapeutic activity, [id number]- Neuromuscular re-education, 97535- Self Care, 02859- Manual therapy, [id number]- Gait training,  [id number]- Aquatic Therapy, 97014- Electrical stimulation (unattended) 97016- Vasopneumatic device,     See Planned Interventions listed in the Plan section of the Evaluation.

## 2023-07-17 ENCOUNTER — Encounter: Payer: Self-pay | Admitting: Rehabilitative and Restorative Service Providers"

## 2023-07-17 ENCOUNTER — Ambulatory Visit: Payer: Medicare HMO | Admitting: Rehabilitative and Restorative Service Providers"

## 2023-07-17 DIAGNOSIS — M6281 Muscle weakness (generalized): Secondary | ICD-10-CM | POA: Diagnosis not present

## 2023-07-17 DIAGNOSIS — R262 Difficulty in walking, not elsewhere classified: Secondary | ICD-10-CM | POA: Diagnosis not present

## 2023-07-17 DIAGNOSIS — R600 Localized edema: Secondary | ICD-10-CM

## 2023-07-17 DIAGNOSIS — M25562 Pain in left knee: Secondary | ICD-10-CM

## 2023-07-17 NOTE — Therapy (Signed)
 OUTPATIENT PHYSICAL THERAPY  TREATMENT   Patient Name: James Robbins. MRN: [id number] DOB:Jan 29, 1951, 73 y.o., male Today's Date: 07/17/2023  END OF SESSION:  PT End of Session - 07/17/23 1702     Visit Number 7    Number of Visits 24    Date for PT Re-Evaluation 09/09/23    Authorization Type humana    Authorization - Visit Number 7    Authorization - Number of Visits 12    Progress Note Due on Visit 10    PT Start Time 1600    PT Stop Time 1644    PT Time Calculation (min) 44 min    Activity Tolerance Patient tolerated treatment well    Behavior During Therapy WFL for tasks assessed/performed                   Past Medical History:  Diagnosis Date   Atrial fibrillation (HCC)    Chronic kidney disease (CKD), stage I    proteinuria   Controlled type 2 diabetes mellitus with diabetic nephropathy (HCC)    Completed DSME 2017   Hematuria 2014   s/p normal urological workup ([place])   Hyperlipidemia    Hypertension    Obesity    Seasonal allergic rhinitis    Sleep apnea    has cpap machine; don't use   Past Surgical History:  Procedure Laterality Date   CARDIOVERSION N/A 11/27/2020   Procedure: CARDIOVERSION;  Surgeon: Nahser, Aleene PARAS, MD;  Location: [facility] ENDOSCOPY;  Service: Cardiovascular;  Laterality: N/A;   CATARACT EXTRACTION Bilateral 08/2021   COLONOSCOPY  06/10/2004   per pt normal ([place])   TOTAL KNEE ARTHROPLASTY Left 06/16/2023   Procedure: LEFT TOTAL KNEE ARTHROPLASTY;  Surgeon: Jerri Kay HERO, MD;  Location: [facility] OR;  Service: Orthopedics;  Laterality: Left;   VASECTOMY     VASECTOMY REVERSAL     Patient Active Problem List   Diagnosis Date Noted   Primary osteoarthritis of left knee 06/16/2023   Status post total left knee replacement 06/16/2023   Hypercoagulable state due to permanent atrial fibrillation (HCC) 01/01/2023   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) 01/01/2022   Hypertensive retinopathy of both eyes  01/01/2022   Vision loss of right eye 09/06/2021   Medicare annual wellness visit, subsequent 09/05/2021   Right retinal artery branch occlusion 04/05/2021   Obstructive sleep apnea hypopnea, severe 11/07/2020   Permanent atrial fibrillation (HCC) 11/07/2020   Primary osteoarthritis of knees, bilateral 04/23/2019   Vitamin D  deficiency 09/23/2016   Advanced care planning/counseling discussion 07/20/2015   Health maintenance examination 02/21/2014   Severe obesity (BMI 35.0-39.9) with comorbidity (HCC)    Type 2 diabetes mellitus with microalbuminuria, without long-term current use of insulin  (HCC)    Seasonal allergic rhinitis 12/24/2011   Hypertension 12/24/2011   Hyperlipidemia associated with type 2 diabetes mellitus (HCC) 12/24/2011    PCP: Rilla Baller, MD   REFERRING PROVIDER: Jerri Kay HERO, MD   REFERRING DIAG:  Diagnosis  [id number] (ICD-10-CM) - Status post total left knee replacement  M17.12 (ICD-10-CM) - Primary osteoarthritis of left knee    THERAPY DIAG:  Acute pain of left knee  Localized edema  Muscle weakness (generalized)  Difficulty in walking, not elsewhere classified  Rationale for Evaluation and Treatment: Rehabilitation  ONSET DATE: 06/16/23 surgery : left TKA  SUBJECTIVE:   SUBJECTIVE STATEMENT: Feels good form yesterday and woke up with his knee looking a whole lot better too. Might have some increased  knee pain from sitting in the car driving back from [facility]. States he has done some of the knee extension activities at home but still doesn't like to keep it out straight for very long.   PERTINENT HISTORY: Left TKA on 06/16/23 Cataract bil 2023 Cardioversion 11/27/20  PAIN:  NPRS scale: 3/10, does not get worse than that.  Pain location: left knee Pain description: achy, throbbing Aggravating factors: trying to lift leg, straightening Relieving factors: 1250 mg tylenol  in the AM, Oxy and muscle relaxer pain meds before bed  mostly  PRECAUTIONS: None  WEIGHT BEARING RESTRICTIONS: No  FALLS:  Has patient fallen in last 6 months? No  LIVING ENVIRONMENT: Lives with: lives with their family and lives with their spouse Lives in: House/apartment Stairs: Yes: Internal: 3 flights steps; on right going up Has following equipment at home: Vannie - 2 wheeled  OCCUPATION: retired  PLOF: Independent  PATIENT GOALS: walk without walker   Next MD visit:   OBJECTIVE:   DIAGNOSTIC FINDINGS: Interval total left knee arthroplasty. No perihardware lucency is seen to indicate hardware failure or loosening. There is a small joint effusion. Expected postoperative intra-articular and anterior subcutaneous air. Interval decrease in now mild mineralization at the superior aspect of the tibial tubercle at the patellar tendon insertion, presumably from partial resection of the prior ossicle that likely represented the sequela of chronic Osgood-Schlatter disease. No acute fracture or dislocation.   IMPRESSION: Interval total left knee arthroplasty without evidence of hardware failure.  PATIENT SURVEYS:  07/01/23: FOTO intake:    43%  COGNITION: 07/04/2023 Overall cognitive status: WFL    SENSATION: 07/04/2023 WFL  EDEMA:  07/01/23: Circumferential: Left:  50 centimeters         Rt: 44.5 centimeters   POSTURE:  07/04/2023: rounded shoulders, forward head, and decreased lumbar lordosis   LOWER EXTREMITY ROM:   ROM Left 07/04/2023 Left 07/09/2023  Hip flexion    Hip extension    Hip abduction    Hip adduction    Hip internal rotation    Hip external rotation    Knee flexion 120 120  Knee extension -6 -5   (Blank rows = not tested)  LOWER EXTREMITY MMT:  MMT Right 07/01/23 Left 07/01/23  Hip flexion    Hip extension    Hip abduction    Hip adduction    Hip internal rotation    Hip external rotation    Knee flexion    Knee extension    Ankle dorsiflexion    Ankle plantarflexion     (Blank  rows = not tested)  FUNCTIONAL TESTS:  07/01/23: 5 time sit to stand: 23.8 seconds c UE support  GAIT: 07/16/2023:  SPC use in clinic, using Rt hand.  Reduced stance on Lt leg with reduced heel contact and toe off progression. Impacts Rt leg step length negatively   Eval: Distance walked: clinic distances, level surfaces Assistive device utilized: Walker - 2 wheeled Level of assistance: Modified independence Comments: antalgic gait, decreased wt shifting to left, decreased heel strike on left, decreased foot clearance, decreased left knee extension  TODAY'S TREATMENT                                                                          DATE: 07/17/2023 Therex: Recumbent bike for ROM 6 mins  Incilne gastroc 3x30sec bilateral  BLE leg press 50# slow eccentrics 2x10; vc for proper LE placement SL leg press 2x10 ea 25#; VC for proper positioning  Seated L LAQ 3 lbs x15  Neuro Re-ed Tandem stance 30 sec x 2 ea leg in // bars; multiple instances of UE use to prevent LOB especially on L leg Side stepping in //bar x2; intermittent UE assist due to LOB Backwards stepping in //bar x3 with RUE assist only; VC and demo for proper mechanics and terminal knee extension Quad set 15x3sec hold; vc for posture tactile cues for quad activation   Declined vaso  TREATMENT                                                                          DATE: 07/16/2023 Therex: UBE Lvl 3.0 for ROM 8 mins LE only Incilne gastroc 30 sec x 5 bilateral  Seated Lt leg LAQ 3 lbs 2 x 15   Education and review of TKE stretch in supine or sitting to promote TKE extension. Performed trial with start of vaso time.  Verbal education provided during vaso treatment time to improve knowledge.   Stretch performed to tolerance with vaso  Neuro Re-ed Tandem stance  30 sec x 4 performed bilaterally in // bars with moderate HHA at times Seated quad set 5 sec hold x 15 for neuro muscular recruitment.  Additional time spent for cues verbally and visually.    TherActivity Step up forward Lt leg with Rt leg posterior step off and Rt hand on bar  2 x 10  Cues for review of navigation of stairs in community or home with good leg up and surgery leg down to continue to improve safety.    Education given on continued use of SPC in home environment to promote safety and stability in ambulation.   Vaso 10 mins Lt leg 40 deg medium compression in elevation with TKE exercise at start.     TREATMENT                                                                          DATE: 07/15/2023 Therex: Recumbent bike Seat 6 for 5 minutes full revolutions Resistance 5 BLE leg press 50# slow eccentrics 2x10; vc for proper LE placement SL leg press 2x10 ea 25#; VC for proper positioning  Prone knee hangs for improving knee extension passively  Theract: Sit to stand 18in chair x10; demonstrated shift to  RLE when rising and lowering to chair, educated on proper weight shift to prevent overuse of RLE.  Gait: -Fwd walking 15ft with SPC CGA, emphasis on heel contact and small step length; use of gait belt due to unsteadiness on feet 1 LOB with increased therapist assist for patient to steady/self right -Bwd walking 69ft with SPC CGA. Emphasis on toe to heel sequence for increased knee extension.  Manual: Seated knee flexion with overpressure; tolerated well Supine knee extension with overpressure; patient guarding knee preventing overpressure from being applied fully, VC and TC for relaxation of hamstrings  Patella glides all directions; tolerated well   Declined vaso  07/09/2022 Therex: Seated long arc quad 10 x 3 seconds Supine straight leg raises x10 with 3-5sec hold; VC for quad set before lifting leg Supine heel slide with strap and PT assist 10x5 sec  hold Supine quad sets 10x5sec hold with heel prop BLE leg press 50# slow eccentrics x10 Sit to stand x10; no UE, short break after 6 due to fatigue  Manual: Knee extension with overpressure; patient guarding knee and requires breaks for knee flexion due to sensitivity to extension. Patella glides all directions; tolerated well   Declined vaso    PATIENT EDUCATION:  Education details: HEP, POC Person educated: Patient Education method: Explanation, Demonstration, Verbal cues, and Handouts Education comprehension: verbalized understanding, returned demonstration, and verbal cues required  HOME EXERCISE PROGRAM: Access Code: [email or web address] URL: [email or web address] Date: 07/01/2023 Prepared by: Delon Lunger  Exercises - Sit to Stand with Counter Support  - 3 x daily - 7 x weekly - 10 reps - Seated Long Arc Quad  - 3 x daily - 7 x weekly - 2 sets - 10 reps - 3-5 seconds hold - Supine Active Straight Leg Raise  - 3 x daily - 7 x weekly - 2 sets - 10 reps - Supine Heel Slide with Strap  - 3 x daily - 7 x weekly - 2 sets - 10 reps - 3-5 seconds hold - Supine Quad Set on Towel Roll  - 3 x daily - 7 x weekly - 2 sets - 10 reps - 5 seconds hold  ASSESSMENT:  CLINICAL IMPRESSION:  Patient continues to demonstrate deficits in balance which transfer into gait as patient continues to demonstrate deviations due to weight shift and LE positioning while ambulating with cane. Continued interventions to target extension deficit are working well and will continue to benefit the patient.   OBJECTIVE IMPAIRMENTS: decreased activity tolerance, decreased balance, decreased mobility, difficulty walking, decreased ROM, decreased strength, increased edema, impaired flexibility, and pain.   ACTIVITY LIMITATIONS: bending, sitting, standing, squatting, sleeping, stairs, transfers, bed mobility, bathing, toileting, and dressing  PARTICIPATION LIMITATIONS: meal prep, cleaning, driving, and  community activity  PERSONAL FACTORS: 3+ comorbidities: see PMH above  are also affecting patient's functional outcome.   REHAB POTENTIAL: Good  CLINICAL DECISION MAKING: Stable/uncomplicated  EVALUATION COMPLEXITY: Low   GOALS: Goals reviewed with patient? Yes  SHORT TERM GOALS: (target date for Short term goals are 3 weeks 07/22/2023)   1.  Patient will demonstrate independent use of home exercise program to maintain progress from in clinic treatments.  Goal status: Partially Met (improve compliance) 07/09/2023  LONG TERM GOALS: (target dates for all long term goals are 10 weeks  09/09/2023 )   1. Patient will demonstrate/report pain at worst less than or equal to 2/10 to facilitate minimal limitation in daily activity secondary to pain symptoms.  Goal status: New   2. Patient  will demonstrate independent use of home exercise program to facilitate ability to maintain/progress functional gains from skilled physical therapy services.  Goal status: New   3. Patient will demonstrate FOTO outcome > or = 55 % to indicate reduced disability due to condition.  Goal status: New   4.  Patient will demonstrate left  LE MMT >/= 4/5 throughout to faciltiate usual transfers, stairs, squatting at [facility] for daily life.   Goal status: New   5.  Patient will demonstrate up and down a flight of stairs with single hand rail with reciprocal gait pattern.  Goal status: New   6.  Pt will improve his left knee active ROM to  from 5 to 120 degrees for improved functional mobility and gait.  Goal status: New   7.  Pt will be able to stand without the use of his UE 5 times in </= 15 seconds to improve functional mobility.  Goal Status: New   PLAN:  PT FREQUENCY: 2-3x/week  PT DURATION: 10 weeks  PLANNED INTERVENTIONS: Can include 02853- PT Re-evaluation, 97110-Therapeutic exercises, 97530- Therapeutic activity, 97112- Neuromuscular re-education, 97535- Self Care, 97140- Manual therapy, [phone]-  Gait training,  [phone]- Aquatic Therapy, 97014- Electrical stimulation (unattended) 02983- Vasopneumatic device,   Patient/Family education, Balance training, Stair training, Taping, Dry Needling, Joint mobilization, Joint manipulation, Spinal manipulation, Spinal mobilization, Scar mobilization, Vestibular training, Visual/preceptual remediation/compensation, DME instructions, Cryotherapy, and Moist heat.  All performed as medically necessary.  All included unless contraindicated  PLAN FOR NEXT SESSION: continue activity for knee extension, address deviations in gait, reeducate on safety with SPC (home vs community ambulation with assistive devices)    Admir Candelas, Xayne Brumbaugh, Student-PT 07/17/2023, 5:03 PM   Referring diagnosis?  S03.347 (ICD-10-CM) - Status post total left knee replacement  M17.12 (ICD-10-CM) - Primary osteoarthritis of left knee   Treatment diagnosis? (if different than referring diagnosis) M25.562, M62.81, R26.2, R60.0  What was this (referring dx) caused by? [x]  Surgery []  Fall []  Ongoing issue []  Arthritis []  Other: ____________  Laterality: []  Rt [x]  Lt []  Both  Check all possible CPT codes: *CHOOSE 10 OR LESS* 97146- PT Re-evaluation, 97110-Therapeutic exercises, 97530- Therapeutic activity, [id number]- Neuromuscular re-education, 97535- Self Care, 02859- Manual therapy, [id number]- Gait training,  [id number]- Aquatic Therapy, 97014- Electrical stimulation (unattended) 97016- Vasopneumatic device,     See Planned Interventions listed in the Plan section of the Evaluation.

## 2023-07-20 DIAGNOSIS — E1121 Type 2 diabetes mellitus with diabetic nephropathy: Secondary | ICD-10-CM | POA: Diagnosis not present

## 2023-07-22 ENCOUNTER — Ambulatory Visit: Payer: Medicare HMO | Admitting: Rehabilitative and Restorative Service Providers"

## 2023-07-22 ENCOUNTER — Encounter: Payer: Self-pay | Admitting: Rehabilitative and Restorative Service Providers"

## 2023-07-22 DIAGNOSIS — R262 Difficulty in walking, not elsewhere classified: Secondary | ICD-10-CM

## 2023-07-22 DIAGNOSIS — R600 Localized edema: Secondary | ICD-10-CM

## 2023-07-22 DIAGNOSIS — M6281 Muscle weakness (generalized): Secondary | ICD-10-CM

## 2023-07-22 DIAGNOSIS — M25562 Pain in left knee: Secondary | ICD-10-CM

## 2023-07-22 NOTE — Therapy (Signed)
OUTPATIENT PHYSICAL THERAPY  TREATMENT   Patient Name: James Robbins. MRN: [id number] DOB:10-15-1950, 73 y.o., male Today's Date: 07/22/2023  END OF SESSION:  PT End of Session - 07/22/23 1601     Visit Number 8    Number of Visits 24    Date for PT Re-Evaluation 09/09/23    Authorization Type humana    Authorization - Visit Number 8    Authorization - Number of Visits 12    Progress Note Due on Visit 10    PT Start Time 1559    PT Stop Time 1642    PT Time Calculation (min) 43 min    Activity Tolerance Patient tolerated treatment well    Behavior During Therapy WFL for tasks assessed/performed                   Past Medical History:  Diagnosis Date   Atrial fibrillation (HCC)    Chronic kidney disease (CKD), stage I    proteinuria   Controlled type 2 diabetes mellitus with diabetic nephropathy (HCC)    Completed DSME 2017   Hematuria 2014   s/p normal urological workup ([place])   Hyperlipidemia    Hypertension    Obesity    Seasonal allergic rhinitis    Sleep apnea    has cpap machine; don't use   Past Surgical History:  Procedure Laterality Date   CARDIOVERSION N/A 11/27/2020   Procedure: CARDIOVERSION;  Surgeon: Nahser, Deloris Ping, MD;  Location: [facility] ENDOSCOPY;  Service: Cardiovascular;  Laterality: N/A;   CATARACT EXTRACTION Bilateral 08/2021   COLONOSCOPY  06/10/2004   per pt normal ([place])   TOTAL KNEE ARTHROPLASTY Left 06/16/2023   Procedure: LEFT TOTAL KNEE ARTHROPLASTY;  Surgeon: Tarry Kos, MD;  Location: [facility] OR;  Service: Orthopedics;  Laterality: Left;   VASECTOMY     VASECTOMY REVERSAL     Patient Active Problem List   Diagnosis Date Noted   Primary osteoarthritis of left knee 06/16/2023   Status post total left knee replacement 06/16/2023   Hypercoagulable state due to permanent atrial fibrillation (HCC) 01/01/2023   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) 01/01/2022   Hypertensive retinopathy of both eyes  01/01/2022   Vision loss of right eye 09/06/2021   Medicare annual wellness visit, subsequent 09/05/2021   Right retinal artery branch occlusion 04/05/2021   Obstructive sleep apnea hypopnea, severe 11/07/2020   Permanent atrial fibrillation (HCC) 11/07/2020   Primary osteoarthritis of knees, bilateral 04/23/2019   Vitamin D deficiency 09/23/2016   Advanced care planning/counseling discussion 07/20/2015   Health maintenance examination 02/21/2014   Severe obesity (BMI 35.0-39.9) with comorbidity (HCC)    Type 2 diabetes mellitus with microalbuminuria, without long-term current use of insulin (HCC)    Seasonal allergic rhinitis 12/24/2011   Hypertension 12/24/2011   Hyperlipidemia associated with type 2 diabetes mellitus (HCC) 12/24/2011    PCP: Eustaquio Boyden, MD   REFERRING PROVIDER: Tarry Kos, MD   REFERRING DIAG:  Diagnosis  [phone] (ICD-10-CM) - Status post total left knee replacement  M17.12 (ICD-10-CM) - Primary osteoarthritis of left knee    THERAPY DIAG:  Acute pain of left knee  Localized edema  Muscle weakness (generalized)  Difficulty in walking, not elsewhere classified  Rationale for Evaluation and Treatment: Rehabilitation  ONSET DATE: 06/16/23 surgery : left TKA  SUBJECTIVE:   SUBJECTIVE STATEMENT: Has had some soreness but nothing too bad that prevents him from continuing on. Took tylonol for pain about 2hrs  ago with some relief  PERTINENT HISTORY: Left TKA on 06/16/23 Cataract bil 2023 Cardioversion 11/27/20  PAIN:  NPRS scale: 2/10  Pain location: left knee Pain description: achy, throbbing Aggravating factors: trying to lift leg, straightening Relieving factors: 1250 mg tylenol in the AM, Oxy and muscle relaxer pain meds before bed mostly  PRECAUTIONS: None  WEIGHT BEARING RESTRICTIONS: No  FALLS:  Has patient fallen in last 6 months? No  LIVING ENVIRONMENT: Lives with: lives with their family and lives with their spouse Lives  in: House/apartment Stairs: Yes: Internal: 3 flights steps; on right going up Has following equipment at home: Dan Humphreys - 2 wheeled  OCCUPATION: retired  PLOF: Independent  PATIENT GOALS: walk without walker   Next MD visit:   OBJECTIVE:   DIAGNOSTIC FINDINGS: Interval total left knee arthroplasty. No perihardware lucency is seen to indicate hardware failure or loosening. There is a small joint effusion. Expected postoperative intra-articular and anterior subcutaneous air. Interval decrease in now mild mineralization at the superior aspect of the tibial tubercle at the patellar tendon insertion, presumably from partial resection of the prior ossicle that likely represented the sequela of chronic Osgood-Schlatter disease. No acute fracture or dislocation.   IMPRESSION: Interval total left knee arthroplasty without evidence of hardware failure.  PATIENT SURVEYS:  07/01/23: FOTO intake:    43%  COGNITION: 07/04/2023 Overall cognitive status: WFL    SENSATION: 07/04/2023 WFL  EDEMA:  07/01/23: Circumferential: Left:  50 centimeters         Rt: 44.5 centimeters   POSTURE:  07/04/2023: rounded shoulders, forward head, and decreased lumbar lordosis   LOWER EXTREMITY ROM:   ROM Left 07/04/2023 Left 07/09/2023  Hip flexion    Hip extension    Hip abduction    Hip adduction    Hip internal rotation    Hip external rotation    Knee flexion 120 120  Knee extension -6 -5   (Blank rows = not tested)  LOWER EXTREMITY MMT:  MMT Right 07/01/23 Left 07/01/23  Hip flexion    Hip extension    Hip abduction    Hip adduction    Hip internal rotation    Hip external rotation    Knee flexion    Knee extension    Ankle dorsiflexion    Ankle plantarflexion     (Blank rows = not tested)  FUNCTIONAL TESTS:  07/01/23: 5 time sit to stand: 23.8 seconds c UE support  GAIT: 07/16/2023:  SPC use in clinic, using Rt hand.  Reduced stance on Lt leg with reduced heel contact and toe  off progression. Impacts Rt leg step length negatively   Eval: Distance walked: clinic distances, level surfaces Assistive device utilized: Walker - 2 wheeled Level of assistance: Modified independence Comments: antalgic gait, decreased wt shifting to left, decreased heel strike on left, decreased foot clearance, decreased left knee extension  TODAY'STREATMENT                                                                          DATE: 07/22/2023 Therex: Recumbent bike for ROM 6 mins level 4  Incilne gastroc 3x30sec bilateral  BLE leg press 56# slow eccentrics x15 then x10 due to fatigue; vc for proper LE placement LLE only leg press 2x10 30#; VC for proper positioning  Seated L LAQ 3 lbs x15; vc for decreasing lean to the side for compensatory strategy Step ups 6in step 2x10; vc for increased knee flexion and prevention of circumduction; required seated break at end    Neuro: //bar tandem walking with UE hover x4 lengths; demo and vc, required seated rest at end //bar side stepping progressed to on airex beam x4 lengths; demo and vc, required seated rest at end Backwards stepping in //bar x3 with RUE assist only; VC and demo for proper mechanics and terminal knee extension; required seated rest at end   TREATMENT                                                                          DATE: 07/17/2023 Therex: Recumbent bike for ROM 6 mins  Incilne gastroc 3x30sec bilateral  BLE leg press 50# slow eccentrics 2x10; vc for proper LE placement SL leg press 2x10 ea 25#; VC for proper positioning  Seated L LAQ 3 lbs x15  Neuro Re-ed Tandem stance 30 sec x 2 ea leg in // bars; multiple instances of UE use to prevent LOB especially on L leg Side stepping in //bar x2; intermittent UE assist due to LOB Backwards stepping in  //bar x3 with RUE assist only; VC and demo for proper mechanics and terminal knee extension Quad set 15x3sec hold; vc for posture tactile cues for quad activation   Declined vaso  TREATMENT                                                                          DATE: 07/16/2023 Therex: UBE Lvl 3.0 for ROM 8 mins LE only Incilne gastroc 30 sec x 5 bilateral  Seated Lt leg LAQ 3 lbs 2 x 15   Education and review of TKE stretch in supine or sitting to promote TKE extension. Performed trial with start of vaso time.  Verbal education provided during vaso treatment time to improve knowledge.   Stretch performed to tolerance with vaso  Neuro Re-ed Tandem stance 30 sec x 4 performed bilaterally in // bars with moderate HHA at times Seated quad set 5 sec hold x 15 for neuro muscular recruitment.  Additional time spent for cues verbally and visually.    TherActivity Step  up forward Lt leg with Rt leg posterior step off and Rt hand on bar  2 x 10  Cues for review of navigation of stairs in community or home with good leg up and surgery leg down to continue to improve safety.    Education given on continued use of SPC in home environment to promote safety and stability in ambulation.   Vaso 10 mins Lt leg 40 deg medium compression in elevation with TKE exercise at start.     TREATMENT                                                                          DATE: 07/15/2023 Therex: Recumbent bike Seat 6 for 5 minutes full revolutions Resistance 5 BLE leg press 50# slow eccentrics 2x10; vc for proper LE placement SL leg press 2x10 ea 25#; VC for proper positioning  Prone knee hangs for improving knee extension passively  Theract: Sit to stand 18in chair x10; demonstrated shift to RLE when rising and lowering to chair, educated on proper weight shift to prevent overuse of RLE.  Gait: -Fwd walking 33ft with SPC CGA, emphasis on heel contact and small step length; use of gait belt due to  unsteadiness on feet 1 LOB with increased therapist assist for patient to steady/self right -Bwd walking 69ft with SPC CGA. Emphasis on toe to heel sequence for increased knee extension.  Manual: Seated knee flexion with overpressure; tolerated well Supine knee extension with overpressure; patient guarding knee preventing overpressure from being applied fully, VC and TC for relaxation of hamstrings  Patella glides all directions; tolerated well   Declined vaso  07/09/2022 Therex: Seated long arc quad 10 x 3 seconds Supine straight leg raises x10 with 3-5sec hold; VC for quad set before lifting leg Supine heel slide with strap and PT assist 10x5 sec hold Supine quad sets 10x5sec hold with heel prop BLE leg press 50# slow eccentrics x10 Sit to stand x10; no UE, short break after 6 due to fatigue  Manual: Knee extension with overpressure; patient guarding knee and requires breaks for knee flexion due to sensitivity to extension. Patella glides all directions; tolerated well   Declined vaso    PATIENT EDUCATION:  Education details: HEP, POC Person educated: Patient Education method: Explanation, Demonstration, Verbal cues, and Handouts Education comprehension: verbalized understanding, returned demonstration, and verbal cues required  HOME EXERCISE PROGRAM: Access Code: [email or web address] URL: [email or web address] Date: 07/01/2023 Prepared by: Narda Amber  Exercises - Sit to Stand with Counter Support  - 3 x daily - 7 x weekly - 10 reps - Seated Long Arc Quad  - 3 x daily - 7 x weekly - 2 sets - 10 reps - 3-5 seconds hold - Supine Active Straight Leg Raise  - 3 x daily - 7 x weekly - 2 sets - 10 reps - Supine Heel Slide with Strap  - 3 x daily - 7 x weekly - 2 sets - 10 reps - 3-5 seconds hold - Supine Quad Set on Towel Roll  - 3 x daily - 7 x weekly - 2 sets - 10 reps - 5 seconds hold  ASSESSMENT:  CLINICAL IMPRESSION:  Patient did well with activity however  required  long seated rest breaks between activity. Continues to demonstrate deviations in gait as well as deficits in strength and coordination which hinder daily activities. Patient will benefit from continued skilled physical therapy to address deficits.    OBJECTIVE IMPAIRMENTS: decreased activity tolerance, decreased balance, decreased mobility, difficulty walking, decreased ROM, decreased strength, increased edema, impaired flexibility, and pain.   ACTIVITY LIMITATIONS: bending, sitting, standing, squatting, sleeping, stairs, transfers, bed mobility, bathing, toileting, and dressing  PARTICIPATION LIMITATIONS: meal prep, cleaning, driving, and community activity  PERSONAL FACTORS: 3+ comorbidities: see PMH above  are also affecting patient's functional outcome.   REHAB POTENTIAL: Good  CLINICAL DECISION MAKING: Stable/uncomplicated  EVALUATION COMPLEXITY: Low   GOALS: Goals reviewed with patient? Yes  SHORT TERM GOALS: (target date for Short term goals are 3 weeks 07/22/2023)   1.  Patient will demonstrate independent use of home exercise program to maintain progress from in clinic treatments.  Goal status: Partially Met (improve compliance) 07/09/2023  LONG TERM GOALS: (target dates for all long term goals are 10 weeks  09/09/2023 )   1. Patient will demonstrate/report pain at worst less than or equal to 2/10 to facilitate minimal limitation in daily activity secondary to pain symptoms.  Goal status: New   2. Patient will demonstrate independent use of home exercise program to facilitate ability to maintain/progress functional gains from skilled physical therapy services.  Goal status: New   3. Patient will demonstrate FOTO outcome > or = 55 % to indicate reduced disability due to condition.  Goal status: New   4.  Patient will demonstrate left  LE MMT >/= 4/5 throughout to faciltiate usual transfers, stairs, squatting at [facility] for daily life.   Goal status: New   5.   Patient will demonstrate up and down a flight of stairs with single hand rail with reciprocal gait pattern.  Goal status: New   6.  Pt will improve his left knee active ROM to  from 5 to 120 degrees for improved functional mobility and gait.  Goal status: New   7.  Pt will be able to stand without the use of his UE 5 times in </= 15 seconds to improve functional mobility.  Goal Status: New   PLAN:  PT FREQUENCY: 2-3x/week  PT DURATION: 10 weeks  PLANNED INTERVENTIONS: Can include 16109- PT Re-evaluation, 97110-Therapeutic exercises, 97530- Therapeutic activity, 97112- Neuromuscular re-education, 97535- Self Care, 97140- Manual therapy, [phone]- Gait training,  [phone]- Aquatic Therapy, 97014- Electrical stimulation (unattended) 91478- Vasopneumatic device,   Patient/Family education, Balance training, Stair training, Taping, Dry Needling, Joint mobilization, Joint manipulation, Spinal manipulation, Spinal mobilization, Scar mobilization, Vestibular training, Visual/preceptual remediation/compensation, DME instructions, Cryotherapy, and Moist heat.  All performed as medically necessary.  All included unless contraindicated  PLAN FOR NEXT SESSION: continue activity for knee extension, address deviations in gait, reeducate on safety with SPC (home vs community ambulation with assistive devices)    Jakia Kennebrew, Haedyn Ancrum, Student-PT 07/22/2023, 4:43 PM   Referring diagnosis?  G95.621 (ICD-10-CM) - Status post total left knee replacement  M17.12 (ICD-10-CM) - Primary osteoarthritis of left knee   Treatment diagnosis? (if different than referring diagnosis) M25.562, M62.81, R26.2, R60.0  What was this (referring dx) caused by? [x]  Surgery []  Fall []  Ongoing issue []  Arthritis []  Other: ____________  Laterality: []  Rt [x]  Lt []  Both  Check all possible CPT codes: *CHOOSE 10 OR LESS* 97146- PT Re-evaluation, 97110-Therapeutic exercises, 97530- Therapeutic activity, [id number]- Neuromuscular  re-education, 97535- Self Care, 30865- Manual therapy, [id number]- Gait training,  40981- Aquatic Therapy, 97014- Electrical stimulation (unattended) 19147- Vasopneumatic device,     See Planned Interventions listed in the Plan section of the Evaluation.

## 2023-07-24 ENCOUNTER — Encounter: Payer: Self-pay | Admitting: Rehabilitative and Restorative Service Providers"

## 2023-07-24 ENCOUNTER — Ambulatory Visit: Payer: Medicare HMO | Admitting: Rehabilitative and Restorative Service Providers"

## 2023-07-24 DIAGNOSIS — M6281 Muscle weakness (generalized): Secondary | ICD-10-CM

## 2023-07-24 DIAGNOSIS — R600 Localized edema: Secondary | ICD-10-CM | POA: Diagnosis not present

## 2023-07-24 DIAGNOSIS — R262 Difficulty in walking, not elsewhere classified: Secondary | ICD-10-CM | POA: Diagnosis not present

## 2023-07-24 DIAGNOSIS — M25562 Pain in left knee: Secondary | ICD-10-CM | POA: Diagnosis not present

## 2023-07-24 NOTE — Therapy (Cosign Needed Addendum)
OUTPATIENT PHYSICAL THERAPY  TREATMENT   Patient Name: James Robbins. MRN: [id number] DOB:1950-11-28, 73 y.o., male Today's Date: 07/24/2023  END OF SESSION:  PT End of Session - 07/24/23 1602     Visit Number 9    Number of Visits 24    Date for PT Re-Evaluation 09/09/23    Authorization Type humana    Authorization - Visit Number 9    Authorization - Number of Visits 12    Progress Note Due on Visit 10    PT Start Time 1457    Activity Tolerance Patient tolerated treatment well    Behavior During Therapy [facility] for tasks assessed/performed                   Past Medical History:  Diagnosis Date   Atrial fibrillation (HCC)    Chronic kidney disease (CKD), stage I    proteinuria   Controlled type 2 diabetes mellitus with diabetic nephropathy (HCC)    Completed DSME 2017   Hematuria 2014   s/p normal urological workup ([place])   Hyperlipidemia    Hypertension    Obesity    Seasonal allergic rhinitis    Sleep apnea    has cpap machine; don't use   Past Surgical History:  Procedure Laterality Date   CARDIOVERSION N/A 11/27/2020   Procedure: CARDIOVERSION;  Surgeon: Nahser, Deloris Ping, MD;  Location: [facility] ENDOSCOPY;  Service: Cardiovascular;  Laterality: N/A;   CATARACT EXTRACTION Bilateral 08/2021   COLONOSCOPY  06/10/2004   per pt normal ([place])   TOTAL KNEE ARTHROPLASTY Left 06/16/2023   Procedure: LEFT TOTAL KNEE ARTHROPLASTY;  Surgeon: Tarry Kos, MD;  Location: [facility] OR;  Service: Orthopedics;  Laterality: Left;   VASECTOMY     VASECTOMY REVERSAL     Patient Active Problem List   Diagnosis Date Noted   Primary osteoarthritis of left knee 06/16/2023   Status post total left knee replacement 06/16/2023   Hypercoagulable state due to permanent atrial fibrillation (HCC) 01/01/2023   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) 01/01/2022   Hypertensive retinopathy of both eyes 01/01/2022   Vision loss of right eye 09/06/2021   Medicare  annual wellness visit, subsequent 09/05/2021   Right retinal artery branch occlusion 04/05/2021   Obstructive sleep apnea hypopnea, severe 11/07/2020   Permanent atrial fibrillation (HCC) 11/07/2020   Primary osteoarthritis of knees, bilateral 04/23/2019   Vitamin D deficiency 09/23/2016   Advanced care planning/counseling discussion 07/20/2015   Health maintenance examination 02/21/2014   Severe obesity (BMI 35.0-39.9) with comorbidity (HCC)    Type 2 diabetes mellitus with microalbuminuria, without long-term current use of insulin (HCC)    Seasonal allergic rhinitis 12/24/2011   Hypertension 12/24/2011   Hyperlipidemia associated with type 2 diabetes mellitus (HCC) 12/24/2011    PCP: Eustaquio Boyden, MD   REFERRING PROVIDER: Tarry Kos, MD   REFERRING DIAG:  Diagnosis  [phone] (ICD-10-CM) - Status post total left knee replacement  M17.12 (ICD-10-CM) - Primary osteoarthritis of left knee    THERAPY DIAG:  Acute pain of left knee  Localized edema  Muscle weakness (generalized)  Difficulty in walking, not elsewhere classified  Rationale for Evaluation and Treatment: Rehabilitation  ONSET DATE: 06/16/23 surgery : left TKA  SUBJECTIVE:   SUBJECTIVE STATEMENT: States this is the best that his knee has felt in a while. Saying that he forgot that his knee was supposed to be hurting.   PERTINENT HISTORY: Left TKA on 06/16/23 Cataract bil 2023  Cardioversion 11/27/20  PAIN:  NPRS scale: 0/10  Pain location: left knee Pain description: achy, throbbing Aggravating factors: trying to lift leg, straightening Relieving factors: 1250 mg tylenol in the AM, Oxy and muscle relaxer pain meds before bed mostly  PRECAUTIONS: None  WEIGHT BEARING RESTRICTIONS: No  FALLS:  Has patient fallen in last 6 months? No  LIVING ENVIRONMENT: Lives with: lives with their family and lives with their spouse Lives in: House/apartment Stairs: Yes: Internal: 3 flights steps; on right  going up Has following equipment at home: Dan Humphreys - 2 wheeled  OCCUPATION: retired  PLOF: Independent  PATIENT GOALS: walk without walker   Next MD visit:   OBJECTIVE:   DIAGNOSTIC FINDINGS: Interval total left knee arthroplasty. No perihardware lucency is seen to indicate hardware failure or loosening. There is a small joint effusion. Expected postoperative intra-articular and anterior subcutaneous air. Interval decrease in now mild mineralization at the superior aspect of the tibial tubercle at the patellar tendon insertion, presumably from partial resection of the prior ossicle that likely represented the sequela of chronic Osgood-Schlatter disease. No acute fracture or dislocation.   IMPRESSION: Interval total left knee arthroplasty without evidence of hardware failure.  PATIENT SURVEYS:  07/01/23: FOTO intake:    43%  COGNITION: 07/04/2023 Overall cognitive status: WFL    SENSATION: 07/04/2023 WFL  EDEMA:  07/01/23: Circumferential: Left:  50 centimeters         Rt: 44.5 centimeters   POSTURE:  07/04/2023: rounded shoulders, forward head, and decreased lumbar lordosis   LOWER EXTREMITY ROM:   ROM Left 07/04/2023 Left 07/09/2023  Hip flexion    Hip extension    Hip abduction    Hip adduction    Hip internal rotation    Hip external rotation    Knee flexion 120 120  Knee extension -6 -5   (Blank rows = not tested)  LOWER EXTREMITY MMT:  MMT Right 07/01/23 Left 07/01/23 Left  07/24/2023 Right 07/24/2023  Hip flexion   3+   Hip extension      Hip abduction      Hip adduction      Hip internal rotation      Hip external rotation      Knee flexion   5   Knee extension   Dynamometer:  33lb and 25lb; pain limiting Dynamometer: 60lb  Ankle dorsiflexion      Ankle plantarflexion       (Blank rows = not tested)  FUNCTIONAL TESTS:  07/01/23: 5 time sit to stand: 23.8 seconds c UE support  GAIT: 07/16/2023:  SPC use in clinic, using Rt hand.  Reduced  stance on Lt leg with reduced heel contact and toe off progression. Impacts Rt leg step length negatively   Eval: Distance walked: clinic distances, level surfaces Assistive device utilized: Walker - 2 wheeled Level of assistance: Modified independence Comments: antalgic gait, decreased wt shifting to left, decreased heel strike on left, decreased foot clearance, decreased left knee extension  TODAY'STREATMENT                                                                          DATE: 07/24/2023 Therex: Recumbent bike for ROM 7 mins level 4  BLE leg press 62# slow eccentrics 2x10; vc for proper LE placement LLE only leg press 2x10 37#; VC for proper positioning  LAQ machine attempted however terminated due to patient pain and weakness   Neuro: //bar tandem walking with UE hover x2 lengths;   Backwards stepping in //bar x3 with RUE assist only; increased time needed Seated SLR x10; vc for proper form and tc for quad set; tactile feedback of pillows behind back to reduce instances of compensatory lean back to lift leg  TREATMENT                                                                          DATE: 07/22/2023 Therex: Recumbent bike for ROM 6 mins level 4  Incilne gastroc 3x30sec bilateral  BLE leg press 56# slow eccentrics x15 then x10 due to fatigue; vc for proper LE placement LLE only leg press 2x10 30#; VC for proper positioning  Seated L LAQ 3 lbs x15; vc for decreasing lean to the side for compensatory strategy Step ups 6in step 2x10; vc for increased knee flexion and prevention of circumduction; required seated break at end    Neuro: //bar tandem walking with UE hover x4 lengths; demo and vc, required seated rest at end //bar side stepping progressed to on airex beam x4 lengths; demo and vc, required seated  rest at end Backwards stepping in //bar x3 with RUE assist only; VC and demo for proper mechanics and terminal knee extension; required seated rest at end   TREATMENT                                                                          DATE: 07/17/2023 Therex: Recumbent bike for ROM 6 mins  Incilne gastroc 3x30sec bilateral  BLE leg press 50# slow eccentrics 2x10; vc for proper LE placement SL leg press 2x10 ea 25#; VC for proper positioning  Seated L LAQ 3 lbs x15  Neuro Re-ed Tandem stance 30 sec x 2 ea leg in // bars; multiple instances of UE use to prevent LOB especially on L leg Side stepping in //bar x2; intermittent UE assist due to LOB Backwards stepping in //bar x3 with RUE assist only; VC and demo for proper mechanics and terminal knee extension Quad set 15x3sec hold; vc for posture tactile cues for quad activation   Declined vaso  TREATMENT  DATE: 07/16/2023 Therex: UBE Lvl 3.0 for ROM 8 mins LE only Incilne gastroc 30 sec x 5 bilateral  Seated Lt leg LAQ 3 lbs 2 x 15   Education and review of TKE stretch in supine or sitting to promote TKE extension. Performed trial with start of vaso time.  Verbal education provided during vaso treatment time to improve knowledge.   Stretch performed to tolerance with vaso  Neuro Re-ed Tandem stance 30 sec x 4 performed bilaterally in // bars with moderate HHA at times Seated quad set 5 sec hold x 15 for neuro muscular recruitment.  Additional time spent for cues verbally and visually.    TherActivity Step up forward Lt leg with Rt leg posterior step off and Rt hand on bar  2 x 10  Cues for review of navigation of stairs in community or home with good leg up and surgery leg down to continue to improve safety.    Education given on continued use of SPC in home environment to promote safety and stability in ambulation.   Vaso 10 mins Lt leg 40 deg medium  compression in elevation with TKE exercise at start.     PATIENT EDUCATION:  Education details: HEP, POC Person educated: Patient Education method: Programmer, multimedia, Demonstration, Verbal cues, and Handouts Education comprehension: verbalized understanding, returned demonstration, and verbal cues required  HOME EXERCISE PROGRAM: Access Code: [email or web address] URL: [email or web address] Date: 07/01/2023 Prepared by: Narda Amber  Exercises - Sit to Stand with Counter Support  - 3 x daily - 7 x weekly - 10 reps - Seated Long Arc Quad  - 3 x daily - 7 x weekly - 2 sets - 10 reps - 3-5 seconds hold - Supine Active Straight Leg Raise  - 3 x daily - 7 x weekly - 2 sets - 10 reps - Supine Heel Slide with Strap  - 3 x daily - 7 x weekly - 2 sets - 10 reps - 3-5 seconds hold - Supine Quad Set on Towel Roll  - 3 x daily - 7 x weekly - 2 sets - 10 reps - 5 seconds hold  ASSESSMENT:  CLINICAL IMPRESSION:  Patient did well with activity this session however quad control and strength remains as a deficit for the patient. To assess quad strength dynamometry was performed showing difference in strength Lt<Rt. Patient demonstrates quad lag with SLR and required reeducation on quad activation. Balance issues remain. Patient will benefit from continued skilled physical therapy to address deficits.    OBJECTIVE IMPAIRMENTS: decreased activity tolerance, decreased balance, decreased mobility, difficulty walking, decreased ROM, decreased strength, increased edema, impaired flexibility, and pain.   ACTIVITY LIMITATIONS: bending, sitting, standing, squatting, sleeping, stairs, transfers, bed mobility, bathing, toileting, and dressing  PARTICIPATION LIMITATIONS: meal prep, cleaning, driving, and community activity  PERSONAL FACTORS: 3+ comorbidities: see PMH above  are also affecting patient's functional outcome.   REHAB POTENTIAL: Good  CLINICAL DECISION MAKING: Stable/uncomplicated  EVALUATION  COMPLEXITY: Low   GOALS: Goals reviewed with patient? Yes  SHORT TERM GOALS: (target date for Short term goals are 3 weeks 07/22/2023)   1.  Patient will demonstrate independent use of home exercise program to maintain progress from in clinic treatments.  Goal status: Partially Met (improve compliance) 07/09/2023  LONG TERM GOALS: (target dates for all long term goals are 10 weeks  09/09/2023 )   1. Patient will demonstrate/report pain at worst less than or equal to 2/10 to facilitate minimal limitation in daily activity secondary to pain symptoms.  Goal status: MET 07/24/2023   2. Patient will demonstrate independent use of home exercise program to facilitate ability to maintain/progress functional gains from skilled physical therapy services.  Goal status: on going 07/24/2023   3. Patient will demonstrate FOTO outcome > or = 55 % to indicate reduced disability due to condition.  Goal status: on going 07/24/2023   4.  Patient will demonstrate left  LE MMT >/= 4/5 throughout to faciltiate usual transfers, stairs, squatting at [facility] for daily life.   Goal status: on going 07/24/2023   5.  Patient will demonstrate up and down a flight of stairs with single hand rail with reciprocal gait pattern.  Goal status: on going 07/24/2023   6.  Pt will improve his left knee active ROM to  from 5 to 120 degrees for improved functional mobility and gait.  Goal status: on going 07/24/2023   7.  Pt will be able to stand without the use of his UE 5 times in </= 15 seconds to improve functional mobility.  Goal Status: on going 07/24/2023   PLAN:  PT FREQUENCY: 2-3x/week  PT DURATION: 10 weeks  PLANNED INTERVENTIONS: Can include 16109- PT Re-evaluation, 97110-Therapeutic exercises, 97530- Therapeutic activity, 97112- Neuromuscular re-education, 97535- Self Care, 97140- Manual therapy, [phone]- Gait training,  [phone]- Aquatic Therapy, 97014- Electrical stimulation (unattended) 91478- Vasopneumatic  device,   Patient/Family education, Balance training, Stair training, Taping, Dry Needling, Joint mobilization, Joint manipulation, Spinal manipulation, Spinal mobilization, Scar mobilization, Vestibular training, Visual/preceptual remediation/compensation, DME instructions, Cryotherapy, and Moist heat.  All performed as medically necessary.  All included unless contraindicated  PLAN FOR NEXT SESSION: quad strengthening and neuro reed  Baldomero Mirarchi, Luciano Cinquemani, Student-PT 07/24/2023, 4:34 PM   Referring diagnosis?  G95.621 (ICD-10-CM) - Status post total left knee replacement  M17.12 (ICD-10-CM) - Primary osteoarthritis of left knee   Treatment diagnosis? (if different than referring diagnosis) M25.562, M62.81, R26.2, R60.0  What was this (referring dx) caused by? [x]  Surgery []  Fall []  Ongoing issue []  Arthritis []  Other: ____________  Laterality: []  Rt [x]  Lt []  Both  Check all possible CPT codes: *CHOOSE 10 OR LESS* 97146- PT Re-evaluation, 97110-Therapeutic exercises, 97530- Therapeutic activity, [id number]- Neuromuscular re-education, 97535- Self Care, 30865- Manual therapy, [id number]- Gait training,  [id number]- Aquatic Therapy, 97014- Electrical stimulation (unattended) 97016- Vasopneumatic device,     See Planned Interventions listed in the Plan section of the Evaluation.

## 2023-07-29 ENCOUNTER — Ambulatory Visit (INDEPENDENT_AMBULATORY_CARE_PROVIDER_SITE_OTHER): Payer: Self-pay

## 2023-07-29 ENCOUNTER — Ambulatory Visit (INDEPENDENT_AMBULATORY_CARE_PROVIDER_SITE_OTHER): Payer: Medicare HMO | Admitting: Orthopaedic Surgery

## 2023-07-29 ENCOUNTER — Encounter: Payer: Self-pay | Admitting: Rehabilitative and Restorative Service Providers"

## 2023-07-29 ENCOUNTER — Ambulatory Visit: Payer: Medicare HMO | Admitting: Rehabilitative and Restorative Service Providers"

## 2023-07-29 DIAGNOSIS — Z96652 Presence of left artificial knee joint: Secondary | ICD-10-CM

## 2023-07-29 DIAGNOSIS — R262 Difficulty in walking, not elsewhere classified: Secondary | ICD-10-CM

## 2023-07-29 DIAGNOSIS — R600 Localized edema: Secondary | ICD-10-CM | POA: Diagnosis not present

## 2023-07-29 DIAGNOSIS — M25562 Pain in left knee: Secondary | ICD-10-CM | POA: Diagnosis not present

## 2023-07-29 DIAGNOSIS — M6281 Muscle weakness (generalized): Secondary | ICD-10-CM

## 2023-07-29 NOTE — Progress Notes (Signed)
 Post-Op Visit Note   Patient: James Robbins.           Date of Birth: [birth year]           MRN: [id number] Visit Date: 07/29/2023 PCP: James Boyden, MD   Assessment & Plan:  Chief Complaint:  Chief Complaint  Patient presents with   Left Knee - Routine Post Op   Visit Diagnoses:  1. Status post total left knee replacement     Plan: James Robbins is here for 6-week postop check.  He is doing well.  He is progressing really well and physical therapy.  Just reports aching to the leg.  Exam of the left knee shows a 2+ pitting edema.  His range of motion is progressing nicely.  Fully healed surgical scar.  No signs of infection.  Implant looks good on x-rays.  Continue physical therapy.  Ace bandages provided for compression.  Recheck in 6 weeks.  Follow-Up Instructions: Return in about 6 weeks (around 09/09/2023) for with James Robbins.   Orders:  Orders Placed This Encounter  Procedures   XR Knee 1-2 Views Left   No orders of the defined types were placed in this encounter.   Imaging: XR Knee 1-2 Views Left Result Date: 07/29/2023 X-rays of the left knee show a stable left total knee replacement with press fit components in good alignment.    PMFS History: Patient Active Problem List   Diagnosis Date Noted   Primary osteoarthritis of left knee 06/16/2023   Status post total left knee replacement 06/16/2023   Hypercoagulable state due to permanent atrial fibrillation (HCC) 01/01/2023   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) 01/01/2022   Hypertensive retinopathy of both eyes 01/01/2022   Vision loss of right eye 09/06/2021   Medicare annual wellness visit, subsequent 09/05/2021   Right retinal artery branch occlusion 04/05/2021   Obstructive sleep apnea hypopnea, severe 11/07/2020   Permanent atrial fibrillation (HCC) 11/07/2020   Primary osteoarthritis of knees, bilateral 04/23/2019   Vitamin D deficiency 09/23/2016   Advanced care  planning/counseling discussion 07/20/2015   Health maintenance examination 02/21/2014   Severe obesity (BMI 35.0-39.9) with comorbidity (HCC)    Type 2 diabetes mellitus with microalbuminuria, without long-term current use of insulin (HCC)    Seasonal allergic rhinitis 12/24/2011   Hypertension 12/24/2011   Hyperlipidemia associated with type 2 diabetes mellitus (HCC) 12/24/2011   Past Medical History:  Diagnosis Date   Atrial fibrillation (HCC)    Chronic kidney disease (CKD), stage I    proteinuria   Controlled type 2 diabetes mellitus with diabetic nephropathy (HCC)    Completed DSME 2017   Hematuria 2014   s/p normal urological workup ([place])   Hyperlipidemia    Hypertension    Obesity    Seasonal allergic rhinitis    Sleep apnea    has cpap machine; don't use    Family History  Problem Relation Age of Onset   Diabetes Mother    Hypertension Mother    Hyperlipidemia Mother    CAD Father        26v CABG, smoker   Osteoporosis Sister    Cancer Neg Hx     Past Surgical History:  Procedure Laterality Date   CARDIOVERSION N/A 11/27/2020   Procedure: CARDIOVERSION;  Surgeon: Nahser, Deloris Ping, MD;  Location[facility] ENDOSCOPY;  Service: Cardiovascular;  Laterality: N/A;   CATARACT EXTRACTION Bilateral 08/2021   COLONOSCOPY  06/10/2004   per pt normal ([place])   TOTAL  KNEE ARTHROPLASTY Left 06/16/2023   Procedure: LEFT TOTAL KNEE ARTHROPLASTY;  Surgeon: Tarry Kos, MD;  Location: [facility] OR;  Service: Orthopedics;  Laterality: Left;   VASECTOMY     VASECTOMY REVERSAL     Social History   Occupational History   Occupation: retired    Associate Professor: UNEMPLOYED    Comment: 2006  Tobacco Use   Smoking status: Former    Current packs/day: 0.00    Types: Cigarettes    Quit date: 06/09/1986    Years since quitting: 37.1   Smokeless tobacco: Never  Vaping Use   Vaping status: Never Used  Substance and Sexual Activity   Alcohol use: No    Alcohol/week: 0.0 standard drinks of  alcohol   Drug use: No   Sexual activity: Not Currently

## 2023-07-29 NOTE — Therapy (Signed)
 OUTPATIENT PHYSICAL THERAPY  TREATMENT/ PROGRESS NOTE   Patient Name: James Robbins. MRN: [id number] DOB:Jul 06, 1950, 73 y.o., male Today's Date: 07/29/2023  Progress Note Reporting Period 07/01/2023 to 07/29/2023  See note below for Objective Data and Assessment of Progress/Goals.   END OF SESSION:  PT End of Session - 07/29/23 1527     Visit Number 10    Number of Visits 24    Date for PT Re-Evaluation 09/09/23    Authorization Type humana    Authorization - Visit Number 10    Authorization - Number of Visits 12    Progress Note Due on Visit 20   Recert for human[employer] at visit 13   PT Start Time 1523    PT Stop Time 1601    PT Time Calculation (min) 38 min    Activity Tolerance Patient tolerated treatment well    Behavior During Therapy WFL for tasks assessed/performed               Past Medical History:  Diagnosis Date   Atrial fibrillation (HCC)    Chronic kidney disease (CKD), stage I    proteinuria   Controlled type 2 diabetes mellitus with diabetic nephropathy (HCC)    Completed DSME 2017   Hematuria 2014   s/p normal urological workup ([place])   Hyperlipidemia    Hypertension    Obesity    Seasonal allergic rhinitis    Sleep apnea    has cpap machine; don't use   Past Surgical History:  Procedure Laterality Date   CARDIOVERSION N/A 11/27/2020   Procedure: CARDIOVERSION;  Surgeon: Nahser, Deloris Ping, MD;  Location: [facility] ENDOSCOPY;  Service: Cardiovascular;  Laterality: N/A;   CATARACT EXTRACTION Bilateral 08/2021   COLONOSCOPY  06/10/2004   per pt normal ([place])   TOTAL KNEE ARTHROPLASTY Left 06/16/2023   Procedure: LEFT TOTAL KNEE ARTHROPLASTY;  Surgeon: Tarry Kos, MD;  Location: [facility] OR;  Service: Orthopedics;  Laterality: Left;   VASECTOMY     VASECTOMY REVERSAL     Patient Active Problem List   Diagnosis Date Noted   Primary osteoarthritis of left knee 06/16/2023   Status post total left knee replacement 06/16/2023   Hypercoagulable state due to  permanent atrial fibrillation (HCC) 01/01/2023   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) 01/01/2022   Hypertensive retinopathy of both eyes 01/01/2022   Vision loss of right eye 09/06/2021   Medicare annual wellness visit, subsequent 09/05/2021   Right retinal artery branch occlusion 04/05/2021   Obstructive sleep apnea hypopnea, severe 11/07/2020   Permanent atrial fibrillation (HCC) 11/07/2020   Primary osteoarthritis of knees, bilateral 04/23/2019   Vitamin D deficiency 09/23/2016   Advanced care planning/counseling discussion 07/20/2015   Health maintenance examination 02/21/2014   Severe obesity (BMI 35.0-39.9) with comorbidity (HCC)    Type 2 diabetes mellitus with microalbuminuria, without long-term current use of insulin (HCC)    Seasonal allergic rhinitis 12/24/2011   Hypertension 12/24/2011   Hyperlipidemia associated with type 2 diabetes mellitus (HCC) 12/24/2011    PCP: Eustaquio Boyden, MD   REFERRING PROVIDER: Tarry Kos, MD   REFERRING DIAG:  Diagnosis  [phone] (ICD-10-CM) - Status post total left knee replacement  M17.12 (ICD-10-CM) - Primary osteoarthritis of left knee    THERAPY DIAG:  Acute pain of left knee  Localized edema  Muscle weakness (generalized)  Difficulty in walking, not elsewhere classified  Rationale for Evaluation and Treatment: Rehabilitation  ONSET DATE: 06/16/23 surgery : left TKA  SUBJECTIVE:   SUBJECTIVE STATEMENT: Pt indicated having some pain and talked with MD about it today prior to todays visit.  Reported some wrap given to put on knee.   No pain upon arrival today.  Pt indicated doing some walking at home without cane.  Pt indicated overall improvement to normal about 75% at this time.   Return to MD/PA in 6 weeks per note.   PERTINENT HISTORY: Left TKA on 06/16/23 Cataract bil 2023 Cardioversion 11/27/20  PAIN:  NPRS scale: 0/10  Pain location: left knee Pain description: achy,  throbbing Aggravating factors: trying to lift leg, straightening Relieving factors: 1250 mg tylenol in the AM, Oxy and muscle relaxer pain meds before bed mostly  PRECAUTIONS: None  WEIGHT BEARING RESTRICTIONS: No  FALLS:  Has patient fallen in last 6 months? No  LIVING ENVIRONMENT: Lives with: lives with their family and lives with their spouse Lives in: House/apartment Stairs: Yes: Internal: 3 flights steps; on right going up Has following equipment at home: Dan Humphreys - 2 wheeled  OCCUPATION: retired  PLOF: Independent  PATIENT GOALS: walk without walker   Next MD visit:   OBJECTIVE:   DIAGNOSTIC FINDINGS: Interval total left knee arthroplasty. No perihardware lucency is seen to indicate hardware failure or loosening. There is a small joint effusion. Expected postoperative intra-articular and anterior subcutaneous air. Interval decrease in now mild mineralization at the superior aspect of the tibial tubercle at the patellar tendon insertion, presumably from partial resection of the prior ossicle that likely represented the sequela of chronic Osgood-Schlatter disease. No acute fracture or dislocation.   IMPRESSION: Interval total left knee arthroplasty without evidence of hardware failure.  PATIENT SURVEYS:  07/01/23: FOTO intake:    43%  COGNITION: 07/04/2023 Overall cognitive status: WFL    SENSATION: 07/04/2023 WFL  EDEMA:  07/01/23: Circumferential: Left:  50 centimeters         Rt: 44.5 centimeters   POSTURE:  07/04/2023: rounded shoulders, forward head, and decreased lumbar lordosis   LOWER EXTREMITY ROM:   ROM Left 07/04/2023 Left 07/09/2023 Left 07/29/2023  Hip flexion     Hip extension     Hip abduction     Hip adduction     Hip internal rotation     Hip external rotation     Knee flexion 120 120 122 AROM in supine heel slide  Knee extension -6 -5 -3 in seated LAQ AROM   (Blank rows = not tested)  LOWER EXTREMITY MMT:  MMT Right 07/01/23  Left 07/01/23 Left  07/24/2023 Right 07/24/2023  Hip flexion   3+   Hip extension      Hip abduction      Hip adduction      Hip internal rotation      Hip external rotation      Knee flexion   5   Knee extension   Dynamometer:  33lb and 25lb; pain limiting Dynamometer: 60lb  Ankle dorsiflexion      Ankle plantarflexion       (Blank rows = not tested)  FUNCTIONAL TESTS:  07/29/2023:  5x sit to stand: 16.85 seconds no UE assist  Lt SLS: < 3 seconds Rt SLS: < 3 seconds TUG with SPC:  13.96 seconds TUG independent:  13.9 seconds  07/01/23: 5 time sit to stand: 23.8 seconds c UE support  GAIT: 07/16/2023:  SPC use in clinic, using Rt hand.  Reduced stance on Lt leg with reduced heel contact and toe off progression. Impacts Rt leg  step length negatively   Eval: Distance walked: clinic distances, level surfaces Assistive device utilized: Environmental consultant - 2 wheeled Level of assistance: Modified independence Comments: antalgic gait, decreased wt shifting to left, decreased heel strike on left, decreased foot clearance, decreased left knee extension                                                                                                                                                                        TODAY'STREATMENT                                                                          DATE: 07/29/2023 Therex: Recumbent bike Lvl 3 8 mins for ROM Seated LAQ Lt leg 4 lbs 2 x 15  Verbal review of continued use of HEP to promote improved mobility quality , strength and balance improvements.    Neuro Re-ed (to improve neural recruitment, balance control/stability) SLS attempt x 3 bilaterally  Tandem stance 1 min x 2 bilateral in // bars with occasional to moderate HHA at times   TherActivity (to improve stairs, ambulation, progressive mobility and transfers of daily life) 18 inch chair 5 x sit to stand no UE assist Standing, walking 10 feet, turning and return 10 ft to sit in  18 inch chair x 1 with SPC, x 1 independent with SBA Step on over and down 6 inch step WB on Lt leg x 2 , dropped to 4 inch x 15 due to difficulty on 6 inch step.  Performed with hand on bar (Rt primarily)   TODAY'STREATMENT                                                                          DATE: 07/24/2023 Therex: Recumbent bike for ROM 7 mins level 4  BLE leg press 62# slow eccentrics 2x10; vc for proper LE placement LLE only leg press 2x10 37#; VC for proper positioning  LAQ machine attempted however terminated due to patient pain and weakness   Neuro: //bar tandem walking with UE hover x2 lengths;   Backwards stepping in //bar x3 with RUE assist only; increased time needed Seated SLR x10; vc for proper form and tc for  quad set; tactile feedback of pillows behind back to reduce instances of compensatory lean back to lift leg  TREATMENT                                                                          DATE: 07/22/2023 Therex: Recumbent bike for ROM 6 mins level 4  Incilne gastroc 3x30sec bilateral  BLE leg press 56# slow eccentrics x15 then x10 due to fatigue; vc for proper LE placement LLE only leg press 2x10 30#; VC for proper positioning  Seated L LAQ 3 lbs x15; vc for decreasing lean to the side for compensatory strategy Step ups 6in step 2x10; vc for increased knee flexion and prevention of circumduction; required seated break at end    Neuro: //bar tandem walking with UE hover x4 lengths; demo and vc, required seated rest at end //bar side stepping progressed to on airex beam x4 lengths; demo and vc, required seated rest at end Backwards stepping in //bar x3 with RUE assist only; VC and demo for proper mechanics and terminal knee extension; required seated rest at end   TREATMENT                                                                          DATE: 07/17/2023 Therex: Recumbent bike for ROM 6 mins  Incilne gastroc 3x30sec bilateral  BLE leg press 50# slow  eccentrics 2x10; vc for proper LE placement SL leg press 2x10 ea 25#; VC for proper positioning  Seated L LAQ 3 lbs x15  Neuro Re-ed Tandem stance 30 sec x 2 ea leg in // bars; multiple instances of UE use to prevent LOB especially on L leg Side stepping in //bar x2; intermittent UE assist due to LOB Backwards stepping in //bar x3 with RUE assist only; VC and demo for proper mechanics and terminal knee extension Quad set 15x3sec hold; vc for posture tactile cues for quad activation   Declined vaso   PATIENT EDUCATION:  Education details: HEP, POC Person educated: Patient Education method: Programmer, multimedia, Demonstration, Verbal cues, and Handouts Education comprehension: verbalized understanding, returned demonstration, and verbal cues required  HOME EXERCISE PROGRAM: Access Code: [email or web address] URL: [email or web address] Date: 07/01/2023 Prepared by: Narda Amber  Exercises - Sit to Stand with Counter Support  - 3 x daily - 7 x weekly - 10 reps - Seated Long Arc Quad  - 3 x daily - 7 x weekly - 2 sets - 10 reps - 3-5 seconds hold - Supine Active Straight Leg Raise  - 3 x daily - 7 x weekly - 2 sets - 10 reps - Supine Heel Slide with Strap  - 3 x daily - 7 x weekly - 2 sets - 10 reps - 3-5 seconds hold - Supine Quad Set on Towel Roll  - 3 x daily - 7 x weekly - 2 sets - 10 reps - 5 seconds hold  ASSESSMENT:  CLINICAL IMPRESSION:  Pt has attended 10  visits overall during course of treatment. Pt has reported reduced symptoms overall (still present though).  Improvement rate indicated around 75% overall. See objective data for updated information regarding current presentation.  Of note, Pt may continue to benefit from strength and balance improvements to continue to make gains towards established goals.   OBJECTIVE IMPAIRMENTS: decreased activity tolerance, decreased balance, decreased mobility, difficulty walking, decreased ROM, decreased strength, increased edema, impaired  flexibility, and pain.   ACTIVITY LIMITATIONS: bending, sitting, standing, squatting, sleeping, stairs, transfers, bed mobility, bathing, toileting, and dressing  PARTICIPATION LIMITATIONS: meal prep, cleaning, driving, and community activity  PERSONAL FACTORS: 3+ comorbidities: see PMH above  are also affecting patient's functional outcome.   REHAB POTENTIAL: Good  CLINICAL DECISION MAKING: Stable/uncomplicated  EVALUATION COMPLEXITY: Low   GOALS: Goals reviewed with patient? Yes  SHORT TERM GOALS: (target date for Short term goals are 3 weeks 07/22/2023)   1.  Patient will demonstrate independent use of home exercise program to maintain progress from in clinic treatments.  Goal status: Partially Met (improve compliance) 07/09/2023  LONG TERM GOALS: (target dates for all long term goals are 10 weeks  09/09/2023 )   1. Patient will demonstrate/report pain at worst less than or equal to 2/10 to facilitate minimal limitation in daily activity secondary to pain symptoms.  Goal status: MET 07/24/2023   2. Patient will demonstrate independent use of home exercise program to facilitate ability to maintain/progress functional gains from skilled physical therapy services.  Goal status: on going 07/29/2023   3. Patient will demonstrate FOTO outcome > or = 55 % to indicate reduced disability due to condition.  Goal status: on going 07/29/2023   4.  Patient will demonstrate left  LE MMT >/= 4/5 throughout to faciltiate usual transfers, stairs, squatting at [facility] for daily life.   Goal status: on going 07/29/2023   5.  Patient will demonstrate up and down a flight of stairs with single hand rail with reciprocal gait pattern.  Goal status: on going 07/29/2023   6.  Pt will improve his left knee active ROM to  from 5 to 120 degrees for improved functional mobility and gait.  Goal status: on going 07/29/2023   7.  Pt will be able to stand without the use of his UE 5 times in </= 15 seconds to  improve functional mobility.  Goal Status: on going 07/29/2023   PLAN:  PT FREQUENCY: 2-3x/week  PT DURATION: 10 weeks  PLANNED INTERVENTIONS: Can include 72536- PT Re-evaluation, 97110-Therapeutic exercises, 97530- Therapeutic activity, 97112- Neuromuscular re-education, 97535- Self Care, 97140- Manual therapy, [phone]- Gait training,  [phone]- Aquatic Therapy, 97014- Electrical stimulation (unattended) 95638- Vasopneumatic device,   Patient/Family education, Balance training, Stair training, Taping, Dry Needling, Joint mobilization, Joint manipulation, Spinal manipulation, Spinal mobilization, Scar mobilization, Vestibular training, Visual/preceptual remediation/compensation, DME instructions, Cryotherapy, and Moist heat.  All performed as medically necessary.  All included unless contraindicated  PLAN FOR NEXT SESSION: Leg strengthening, balance improvements.  [employer] recert on 13th visit (plan to add performance testing to charges and drop aquatic)   Chyrel Masson, PT, DPT, OCS, ATC 07/29/23  4:05 PM     Referring diagnosis?  V56.433 (ICD-10-CM) - Status post total left knee replacement  M17.12 (ICD-10-CM) - Primary osteoarthritis of left knee   Treatment diagnosis? (if different than referring diagnosis) M25.562, M62.81, R26.2, R60.0  What was this (referring dx) caused by? [x]  Surgery []  Fall []  Ongoing issue []  Arthritis []  Other: ____________  Laterality: []  Rt [x]   Lt []  Both  Check all possible CPT codes: *CHOOSE 10 OR LESS* 97146- PT Re-evaluation, 97110-Therapeutic exercises, 97530- Therapeutic activity, [id number]- Neuromuscular re-education, 97535- Self Care, 40981- Manual therapy, [id number]- Gait training,  [phone]- Aquatic Therapy, 97014- Electrical stimulation (unattended) 97016- Vasopneumatic device,     See Planned Interventions listed in the Plan section of the Evaluation.

## 2023-07-30 ENCOUNTER — Encounter: Payer: Medicare HMO | Admitting: Rehabilitative and Restorative Service Providers"

## 2023-07-31 ENCOUNTER — Encounter: Payer: Medicare HMO | Admitting: Rehabilitative and Restorative Service Providers"

## 2023-08-04 ENCOUNTER — Encounter: Payer: Medicare HMO | Admitting: Rehabilitative and Restorative Service Providers"

## 2023-08-06 ENCOUNTER — Encounter: Payer: Self-pay | Admitting: Rehabilitative and Restorative Service Providers"

## 2023-08-06 ENCOUNTER — Ambulatory Visit: Payer: Medicare HMO | Admitting: Rehabilitative and Restorative Service Providers"

## 2023-08-06 DIAGNOSIS — M6281 Muscle weakness (generalized): Secondary | ICD-10-CM | POA: Diagnosis not present

## 2023-08-06 DIAGNOSIS — M25562 Pain in left knee: Secondary | ICD-10-CM

## 2023-08-06 DIAGNOSIS — R600 Localized edema: Secondary | ICD-10-CM

## 2023-08-06 DIAGNOSIS — R262 Difficulty in walking, not elsewhere classified: Secondary | ICD-10-CM | POA: Diagnosis not present

## 2023-08-06 NOTE — Therapy (Signed)
 OUTPATIENT PHYSICAL THERAPY  TREATMENT   Patient Name: James Robbins. MRN: [id number] DOB:07-31-1950, 73 y.o., male Today's Date: 08/06/2023   END OF SESSION:  PT End of Session - 08/06/23 1607     Visit Number 11    Number of Visits 24    Date for PT Re-Evaluation 09/09/23    Authorization Type humana    Authorization - Visit Number 11    Authorization - Number of Visits 12    Progress Note Due on Visit 20   Recert for humana at visit 13   PT Start Time 1555    PT Stop Time 1635    PT Time Calculation (min) 40 min    Activity Tolerance Patient tolerated treatment well    Behavior During Therapy WFL for tasks assessed/performed                Past Medical History:  Diagnosis Date   Atrial fibrillation (HCC)    Chronic kidney disease (CKD), stage I    proteinuria   Controlled type 2 diabetes mellitus with diabetic nephropathy (HCC)    Completed DSME 2017   Hematuria 2014   s/p normal urological workup ([place])   Hyperlipidemia    Hypertension    Obesity    Seasonal allergic rhinitis    Sleep apnea    has cpap machine; don't use   Past Surgical History:  Procedure Laterality Date   CARDIOVERSION N/A 11/27/2020   Procedure: CARDIOVERSION;  Surgeon: Nahser, Deloris Ping, MD;  Location: [facility] ENDOSCOPY;  Service: Cardiovascular;  Laterality: N/A;   CATARACT EXTRACTION Bilateral 08/2021   COLONOSCOPY  06/10/2004   per pt normal ([place])   TOTAL KNEE ARTHROPLASTY Left 06/16/2023   Procedure: LEFT TOTAL KNEE ARTHROPLASTY;  Surgeon: Tarry Kos, MD;  Location: [facility] OR;  Service: Orthopedics;  Laterality: Left;   VASECTOMY     VASECTOMY REVERSAL     Patient Active Problem List   Diagnosis Date Noted   Primary osteoarthritis of left knee 06/16/2023   Status post total left knee replacement 06/16/2023   Hypercoagulable state due to permanent atrial fibrillation (HCC) 01/01/2023   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) 01/01/2022   Hypertensive  retinopathy of both eyes 01/01/2022   Vision loss of right eye 09/06/2021   Medicare annual wellness visit, subsequent 09/05/2021   Right retinal artery branch occlusion 04/05/2021   Obstructive sleep apnea hypopnea, severe 11/07/2020   Permanent atrial fibrillation (HCC) 11/07/2020   Primary osteoarthritis of knees, bilateral 04/23/2019   Vitamin D deficiency 09/23/2016   Advanced care planning/counseling discussion 07/20/2015   Health maintenance examination 02/21/2014   Severe obesity (BMI 35.0-39.9) with comorbidity (HCC)    Type 2 diabetes mellitus with microalbuminuria, without long-term current use of insulin (HCC)    Seasonal allergic rhinitis 12/24/2011   Hypertension 12/24/2011   Hyperlipidemia associated with type 2 diabetes mellitus (HCC) 12/24/2011    PCP: Eustaquio Boyden, MD   REFERRING PROVIDER: Cristie Hem, PA-C   REFERRING DIAG:  Diagnosis  [phone] (ICD-10-CM) - Status post total left knee replacement  M17.12 (ICD-10-CM) - Primary osteoarthritis of left knee    THERAPY DIAG:  Acute pain of left knee  Localized edema  Muscle weakness (generalized)  Difficulty in walking, not elsewhere classified  Rationale for Evaluation and Treatment: Rehabilitation  ONSET DATE: 06/16/23 surgery : left TKA  SUBJECTIVE:   SUBJECTIVE STATEMENT: Pt indicated no specific pain upon arrival today.  Pt asked whether this was going  to be his last visit or not.  Wasn't sure.   PERTINENT HISTORY: Left TKA on 06/16/23 Cataract bil 2023 Cardioversion 11/27/20  PAIN:  NPRS scale: 0/10  Pain location: left knee Pain description: achy, throbbing Aggravating factors: trying to lift leg, straightening Relieving factors: 1250 mg tylenol in the AM, Oxy and muscle relaxer pain meds before bed mostly  PRECAUTIONS: None  WEIGHT BEARING RESTRICTIONS: No  FALLS:  Has patient fallen in last 6 months? No  LIVING ENVIRONMENT: Lives with: lives with their family and lives  with their spouse Lives in: House/apartment Stairs: Yes: Internal: 3 flights steps; on right going up Has following equipment at home: Dan Humphreys - 2 wheeled  OCCUPATION: retired  PLOF: Independent  PATIENT GOALS: walk without walker   Next MD visit:   OBJECTIVE:   DIAGNOSTIC FINDINGS: Interval total left knee arthroplasty. No perihardware lucency is seen to indicate hardware failure or loosening. There is a small joint effusion. Expected postoperative intra-articular and anterior subcutaneous air. Interval decrease in now mild mineralization at the superior aspect of the tibial tubercle at the patellar tendon insertion, presumably from partial resection of the prior ossicle that likely represented the sequela of chronic Osgood-Schlatter disease. No acute fracture or dislocation.   IMPRESSION: Interval total left knee arthroplasty without evidence of hardware failure.  PATIENT SURVEYS:  07/01/23: FOTO intake:    43%  COGNITION: 07/04/2023 Overall cognitive status: WFL    SENSATION: 07/04/2023 WFL  EDEMA:  07/01/23: Circumferential: Left:  50 centimeters         Rt: 44.5 centimeters   POSTURE:  07/04/2023: rounded shoulders, forward head, and decreased lumbar lordosis   LOWER EXTREMITY ROM:   ROM Left 07/04/2023 Left 07/09/2023 Left 07/29/2023  Hip flexion     Hip extension     Hip abduction     Hip adduction     Hip internal rotation     Hip external rotation     Knee flexion 120 120 122 AROM in supine heel slide  Knee extension -6 -5 -3 in seated LAQ AROM   (Blank rows = not tested)  LOWER EXTREMITY MMT:  MMT Right 07/01/23 Left 07/01/23 Left  07/24/2023 Right 07/24/2023  Hip flexion   3+   Hip extension      Hip abduction      Hip adduction      Hip internal rotation      Hip external rotation      Knee flexion   5   Knee extension   Dynamometer:  33lb and 25lb; pain limiting Dynamometer: 60lb  Ankle dorsiflexion      Ankle plantarflexion        (Blank rows = not tested)  FUNCTIONAL TESTS:  07/29/2023:  5x sit to stand: 16.85 seconds no UE assist  Lt SLS: < 3 seconds Rt SLS: < 3 seconds TUG with SPC:  13.96 seconds TUG independent:  13.9 seconds  07/01/23: 5 time sit to stand: 23.8 seconds c UE support  GAIT: 07/16/2023:  SPC use in clinic, using Rt hand.  Reduced stance on Lt leg with reduced heel contact and toe off progression. Impacts Rt leg step length negatively   Eval: Distance walked: clinic distances, level surfaces Assistive device utilized: Walker - 2 wheeled Level of assistance: Modified independence Comments: antalgic gait, decreased wt shifting to left, decreased heel strike on left, decreased foot clearance, decreased left knee extension  TODAY'STREATMENT                                                                          DATE: 08/06/2023 Therex: Recumbent bike Lvl 3 9 mins for ROM Seated Lt SLR 2 x 10   Continued verbal review of HEP and verbal cues for gym based equipment use.   Neuro Re-ed (to improve neural recruitment, balance control/stability) SLS with contralateral leg corner touching on black mat with occasional HHA on bars with SBA x 6 each bilaterally  Tandem stance 1 min x 1 bilateral with occasional HHA on bar  TherActivity (to improve stairs, ambulation, progressive mobility and transfers of daily life) Leg press double leg 100 lbs x 15, single leg Lt 2 x 15 50 lbs  Flight of stairs with single hand rail up/down c reciprocal gait pattern with SBA  TODAY'STREATMENT                                                                          DATE: 07/29/2023 Therex: Recumbent bike Lvl 3 8 mins for ROM Seated LAQ Lt leg 4 lbs 2 x 15  Verbal review of continued use of HEP to promote improved mobility quality , strength and balance  improvements.    Neuro Re-ed (to improve neural recruitment, balance control/stability) SLS attempt x 3 bilaterally  Tandem stance 1 min x 2 bilateral in // bars with occasional to moderate HHA at times   TherActivity (to improve stairs, ambulation, progressive mobility and transfers of daily life) 18 inch chair 5 x sit to stand no UE assist Standing, walking 10 feet, turning and return 10 ft to sit in 18 inch chair x 1 with SPC, x 1 independent with SBA Step on over and down 6 inch step WB on Lt leg x 2 , dropped to 4 inch x 15 due to difficulty on 6 inch step.  Performed with hand on bar (Rt primarily)   TODAY'STREATMENT                                                                          DATE: 07/24/2023 Therex: Recumbent bike for ROM 7 mins level 4  BLE leg press 62# slow eccentrics 2x10; vc for proper LE placement LLE only leg press 2x10 37#; VC for proper positioning  LAQ machine attempted however terminated due to patient pain and weakness   Neuro: //bar tandem walking with UE hover x2 lengths;   Backwards stepping in //bar x3 with RUE assist only; increased time needed Seated SLR x10; vc for proper form and tc for quad set; tactile feedback of pillows behind back to reduce instances of compensatory lean back to lift  leg   PATIENT EDUCATION:  Education details: HEP, POC Person educated: Patient Education method: Explanation, Demonstration, Verbal cues, and Handouts Education comprehension: verbalized understanding, returned demonstration, and verbal cues required  HOME EXERCISE PROGRAM: Access Code: [email or web address] URL: [email or web address] Date: 07/01/2023 Prepared by: Narda Amber  Exercises - Sit to Stand with Counter Support  - 3 x daily - 7 x weekly - 10 reps - Seated Long Arc Quad  - 3 x daily - 7 x weekly - 2 sets - 10 reps - 3-5 seconds hold - Supine Active Straight Leg Raise  - 3 x daily - 7 x weekly - 2 sets - 10 reps - Supine Heel Slide with  Strap  - 3 x daily - 7 x weekly - 2 sets - 10 reps - 3-5 seconds hold - Supine Quad Set on Towel Roll  - 3 x daily - 7 x weekly - 2 sets - 10 reps - 5 seconds hold  ASSESSMENT:  CLINICAL IMPRESSION:  Discussed with patient importance of certain objective measurement gains and goal achievement and how that impacts future functional ability.  At this time, Pt would benefit most from progressive strengthening and balance improvements to help improve progressive mobility.  Pt in agreement with scheduling 2 more visits to complete 12 approved humana visits.   OBJECTIVE IMPAIRMENTS: decreased activity tolerance, decreased balance, decreased mobility, difficulty walking, decreased ROM, decreased strength, increased edema, impaired flexibility, and pain.   ACTIVITY LIMITATIONS: bending, sitting, standing, squatting, sleeping, stairs, transfers, bed mobility, bathing, toileting, and dressing  PARTICIPATION LIMITATIONS: meal prep, cleaning, driving, and community activity  PERSONAL FACTORS: 3+ comorbidities: see PMH above  are also affecting patient's functional outcome.   REHAB POTENTIAL: Good  CLINICAL DECISION MAKING: Stable/uncomplicated  EVALUATION COMPLEXITY: Low   GOALS: Goals reviewed with patient? Yes  SHORT TERM GOALS: (target date for Short term goals are 3 weeks 07/22/2023)   1.  Patient will demonstrate independent use of home exercise program to maintain progress from in clinic treatments.  Goal status: Partially Met (improve compliance) 07/09/2023  LONG TERM GOALS: (target dates for all long term goals are 10 weeks  09/09/2023 )   1. Patient will demonstrate/report pain at worst less than or equal to 2/10 to facilitate minimal limitation in daily activity secondary to pain symptoms.  Goal status: MET 07/24/2023   2. Patient will demonstrate independent use of home exercise program to facilitate ability to maintain/progress functional gains from skilled physical therapy  services.  Goal status: on going 07/29/2023   3. Patient will demonstrate FOTO outcome > or = 55 % to indicate reduced disability due to condition.  Goal status: on going 07/29/2023   4.  Patient will demonstrate left  LE MMT >/= 4/5 throughout to faciltiate usual transfers, stairs, squatting at [facility] for daily life.   Goal status: on going 07/29/2023   5.  Patient will demonstrate up and down a flight of stairs with single hand rail with reciprocal gait pattern.  Goal status: on going 07/29/2023   6.  Pt will improve his left knee active ROM to  from 5 to 120 degrees for improved functional mobility and gait.  Goal status: on going 07/29/2023   7.  Pt will be able to stand without the use of his UE 5 times in </= 15 seconds to improve functional mobility.  Goal Status: on going 07/29/2023   PLAN:  PT FREQUENCY: 2-3x/week  PT DURATION: 10 weeks  PLANNED INTERVENTIONS: Can include 53664- PT Re-evaluation,  97110-Therapeutic exercises, 97530- Therapeutic activity, [id number]- Neuromuscular re-education, [phone]- Self Care, 60454- Manual therapy, [phone]- Gait training,  [phone]- Aquatic Therapy, 97014- Electrical stimulation (unattended) 97016- Vasopneumatic device,   Patient/Family education, Balance training, Stair training, Taping, Dry Needling, Joint mobilization, Joint manipulation, Spinal manipulation, Spinal mobilization, Scar mobilization, Vestibular training, Visual/preceptual remediation/compensation, DME instructions, Cryotherapy, and Moist heat.  All performed as medically necessary.  All included unless contraindicated  PLAN FOR NEXT SESSION: Leg strengthening, balance improvements.  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  G95.621 (ICD-10-CM) - Status post total left knee replacement  M17.12 (ICD-10-CM) - Primary osteoarthritis of left knee    Treatment diagnosis? (if different than referring diagnosis) M25.562, M62.81, R26.2, R60.0  What was this (referring dx) caused by? [x]  Surgery []  Fall []  Ongoing issue []  Arthritis []  Other: ____________  Laterality: []  Rt [x]  Lt []  Both  Check all possible CPT codes: *CHOOSE 10 OR LESS* 97146- PT Re-evaluation, 97110-Therapeutic exercises, 97530- Therapeutic activity, [id number]- Neuromuscular re-education, 97535- Self Care, 30865- Manual therapy, [id number]- Gait training,  [id number]- Aquatic Therapy, 97014- Electrical stimulation (unattended) 97016- Vasopneumatic device,     See Planned Interventions listed in the Plan section of the Evaluation.

## 2023-08-12 ENCOUNTER — Telehealth: Payer: Self-pay

## 2023-08-12 NOTE — Telephone Encounter (Signed)
 Copied from CRM [id number]. Topic: Clinical - Medication Refill >> Aug 12, 2023  4:14 PM Isabelle Course C wrote: Medication Not on List- ([employer]) Meter is not working... Patient may need a new brand. Patient never like this brand because he was having a difficult time using it

## 2023-08-12 NOTE — Telephone Encounter (Signed)
 Spoke with pt's wife, Ivor Messier (on dpr) about current glucometer. States it will cut on , then shuts down shortly afterward. Will not stay on long enough to use it. Says pt has had it for awhile.  I recommended they contact insurance co to see what brand and model glucometer is covered, then let us know. Then we'll send in a new rx. She verbalizes understanding and will let us know what she finds out.

## 2023-08-22 NOTE — Telephone Encounter (Unsigned)
 Copied from CRM [id number]. Topic: General - Other >> Aug 21, 2023  5:09 PM Eunice Blase wrote: Reason for CRM: Received call from pt's wife stated pt needs Glucose meter insurance will pay for [employer]. [employer] ask to please  fax# [phone].Marland Kitchen

## 2023-08-27 ENCOUNTER — Encounter: Payer: Self-pay | Admitting: Rehabilitative and Restorative Service Providers"

## 2023-08-27 ENCOUNTER — Ambulatory Visit: Payer: Medicare HMO | Admitting: Rehabilitative and Restorative Service Providers"

## 2023-08-27 DIAGNOSIS — M25562 Pain in left knee: Secondary | ICD-10-CM | POA: Diagnosis not present

## 2023-08-27 DIAGNOSIS — R262 Difficulty in walking, not elsewhere classified: Secondary | ICD-10-CM

## 2023-08-27 DIAGNOSIS — R600 Localized edema: Secondary | ICD-10-CM | POA: Diagnosis not present

## 2023-08-27 DIAGNOSIS — M6281 Muscle weakness (generalized): Secondary | ICD-10-CM | POA: Diagnosis not present

## 2023-08-27 NOTE — Therapy (Signed)
 OUTPATIENT PHYSICAL THERAPY  TREATMENT / DISCHARGE   Patient Name: James Robbins. MRN: [id number] DOB:12/14/1950, 73 y.o., male Today's Date: 08/27/2023  PHYSICAL THERAPY DISCHARGE SUMMARY  Visits from Start of Care: 12  Current functional level related to goals / functional outcomes: See note   Remaining deficits: See note   Education / Equipment: HEP  Patient goals were  mostly met . Patient is being discharged due to being pleased with the current functional level.   END OF SESSION:  PT End of Session - 08/27/23 1353     Visit Number 12    Number of Visits 24    Date for PT Re-Evaluation 09/09/23    Authorization Type humana    Authorization - Visit Number 12    Authorization - Number of Visits 12    Progress Note Due on Visit 20   Recert for humana at visit 13   PT Start Time 1347    PT Stop Time 1426    PT Time Calculation (min) 39 min    Activity Tolerance Patient tolerated treatment well    Behavior During Therapy WFL for tasks assessed/performed                 Past Medical History:  Diagnosis Date   Atrial fibrillation (HCC)    Chronic kidney disease (CKD), stage I    proteinuria   Controlled type 2 diabetes mellitus with diabetic nephropathy (HCC)    Completed DSME 2017   Hematuria 2014   s/p normal urological workup ([place])   Hyperlipidemia    Hypertension    Obesity    Seasonal allergic rhinitis    Sleep apnea    has cpap machine; don't use   Past Surgical History:  Procedure Laterality Date   CARDIOVERSION N/A 11/27/2020   Procedure: CARDIOVERSION;  Surgeon: Nahser, Deloris Ping, MD;  Location: [facility] ENDOSCOPY;  Service: Cardiovascular;  Laterality: N/A;   CATARACT EXTRACTION Bilateral 08/2021   COLONOSCOPY  06/10/2004   per pt normal ([place])   TOTAL KNEE ARTHROPLASTY Left 06/16/2023   Procedure: LEFT TOTAL KNEE ARTHROPLASTY;  Surgeon: Tarry Kos, MD;  Location: [facility] OR;  Service: Orthopedics;  Laterality: Left;   VASECTOMY      VASECTOMY REVERSAL     Patient Active Problem List   Diagnosis Date Noted   Primary osteoarthritis of left knee 06/16/2023   Status post total left knee replacement 06/16/2023   Hypercoagulable state due to permanent atrial fibrillation (HCC) 01/01/2023   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) 01/01/2022   Hypertensive retinopathy of both eyes 01/01/2022   Vision loss of right eye 09/06/2021   Medicare annual wellness visit, subsequent 09/05/2021   Right retinal artery branch occlusion 04/05/2021   Obstructive sleep apnea hypopnea, severe 11/07/2020   Permanent atrial fibrillation (HCC) 11/07/2020   Primary osteoarthritis of knees, bilateral 04/23/2019   Vitamin D deficiency 09/23/2016   Advanced care planning/counseling discussion 07/20/2015   Health maintenance examination 02/21/2014   Severe obesity (BMI 35.0-39.9) with comorbidity (HCC)    Type 2 diabetes mellitus with microalbuminuria, without long-term current use of insulin (HCC)    Seasonal allergic rhinitis 12/24/2011   Hypertension 12/24/2011   Hyperlipidemia associated with type 2 diabetes mellitus (HCC) 12/24/2011    PCP: Eustaquio Boyden, MD   REFERRING PROVIDER: Tarry Kos, MD   REFERRING DIAG:  Diagnosis  [phone] (ICD-10-CM) - Status post total left knee replacement  M17.12 (ICD-10-CM) - Primary osteoarthritis of left knee  THERAPY DIAG:  Acute pain of left knee  Localized edema  Muscle weakness (generalized)  Difficulty in walking, not elsewhere classified  Rationale for Evaluation and Treatment: Rehabilitation  ONSET DATE: 06/16/23 surgery : left TKA  SUBJECTIVE:   SUBJECTIVE STATEMENT: Pt indicated he has been doing silver sneaker.  Pt indicated having soreness from exercise.  Taking tylenol 1 pill 2x per day.  Pt indicated using the cane at times but less and less.  Pt indicated doing silver sneakers every other day or so.  Reported overall improvement to normal around  75%.   PERTINENT HISTORY: Left TKA on 06/16/23 Cataract bil 2023 Cardioversion 11/27/20  PAIN:  NPRS scale: 0/10  Pain location: left knee Pain description: achy, throbbing Aggravating factors: trying to lift leg, straightening Relieving factors: 1250 mg tylenol in the AM, Oxy and muscle relaxer pain meds before bed mostly  PRECAUTIONS: None  WEIGHT BEARING RESTRICTIONS: No  FALLS:  Has patient fallen in last 6 months? No  LIVING ENVIRONMENT: Lives with: lives with their family and lives with their spouse Lives in: House/apartment Stairs: Yes: Internal: 3 flights steps; on right going up Has following equipment at home: Dan Humphreys - 2 wheeled  OCCUPATION: retired  PLOF: Independent  PATIENT GOALS: walk without walker   Next MD visit:   OBJECTIVE:   DIAGNOSTIC FINDINGS: Interval total left knee arthroplasty. No perihardware lucency is seen to indicate hardware failure or loosening. There is a small joint effusion. Expected postoperative intra-articular and anterior subcutaneous air. Interval decrease in now mild mineralization at the superior aspect of the tibial tubercle at the patellar tendon insertion, presumably from partial resection of the prior ossicle that likely represented the sequela of chronic Osgood-Schlatter disease. No acute fracture or dislocation.   IMPRESSION: Interval total left knee arthroplasty without evidence of hardware failure.  PATIENT SURVEYS:  08/27/2023:  FOTO update:  53 %  07/01/23: FOTO intake:    43%  COGNITION: 07/04/2023 Overall cognitive status: WFL    SENSATION: 07/04/2023 WFL  EDEMA:  07/01/23: Circumferential: Left:  50 centimeters         Rt: 44.5 centimeters   POSTURE:  07/04/2023: rounded shoulders, forward head, and decreased lumbar lordosis   LOWER EXTREMITY ROM:   ROM Left 07/04/2023 Left 07/09/2023 Left 07/29/2023  Hip flexion     Hip extension     Hip abduction     Hip adduction     Hip internal rotation      Hip external rotation     Knee flexion 120 120 122 AROM in supine heel slide  Knee extension -6 -5 -3 in seated LAQ AROM   (Blank rows = not tested)  LOWER EXTREMITY MMT:  MMT Right 07/01/23 Left 07/01/23 Left  07/24/2023 Right 07/24/2023  Hip flexion   3+   Hip extension      Hip abduction      Hip adduction      Hip internal rotation      Hip external rotation      Knee flexion   5   Knee extension   Dynamometer:  33lb and 25lb; pain limiting Dynamometer: 60lb  Ankle dorsiflexion      Ankle plantarflexion       (Blank rows = not tested)  FUNCTIONAL TESTS:  07/29/2023:  5x sit to stand: 16.85 seconds no UE assist  Lt SLS: < 3 seconds Rt SLS: < 3 seconds TUG with SPC:  13.96 seconds TUG independent:  13.9 seconds  07/01/23: 5 time  sit to stand: 23.8 seconds c UE support  GAIT: 07/16/2023:  SPC use in clinic, using Rt hand.  Reduced stance on Lt leg with reduced heel contact and toe off progression. Impacts Rt leg step length negatively   Eval: Distance walked: clinic distances, level surfaces Assistive device utilized: Environmental consultant - 2 wheeled Level of assistance: Modified independence Comments: antalgic gait, decreased wt shifting to left, decreased heel strike on left, decreased foot clearance, decreased left knee extension                                                                                                                                                                        TODAY'STREATMENT                                                                          DATE: 08/27/2023 Therex: Recumbent bike Lvl 3 10 mins for ROM Knee extension machine double leg up, single leg lowering slowly 5 lbs x 15, performed bilaterally  Knee flexion hamstring curl 2 x 10 bilateral single leg 10 lbs   Review of gym equipment use and setup and cues for higher reps with lesser resistance.  Encouraged strength training every other day not every day.  Bike whenever based off  fatigue. Additional time spent in review of techniques and setup.    TherActivity (to improve stairs, ambulation, progressive mobility and transfers of daily life) Leg press double leg 100 lbs x 20, single leg Lt 2 x 15 50 lbs .  Additional time in review of exercise setup at gym    TODAY'STREATMENT                                                                          DATE: 08/06/2023 Therex: Recumbent bike Lvl 3 9 mins for ROM Seated Lt SLR 2 x 10   Continued verbal review of HEP and verbal cues for gym based equipment use.   Neuro Re-ed (to improve neural recruitment, balance control/stability) SLS with contralateral leg corner touching on black mat with occasional HHA on bars with SBA x 6 each bilaterally  Tandem stance 1 min x 1 bilateral with occasional HHA on bar  TherActivity (  to improve stairs, ambulation, progressive mobility and transfers of daily life) Leg press double leg 100 lbs x 15, single leg Lt 2 x 15 50 lbs  Flight of stairs with single hand rail up/down c reciprocal gait pattern with SBA  TODAY'STREATMENT                                                                          DATE: 07/29/2023 Therex: Recumbent bike Lvl 3 8 mins for ROM Seated LAQ Lt leg 4 lbs 2 x 15  Verbal review of continued use of HEP to promote improved mobility quality , strength and balance improvements.    Neuro Re-ed (to improve neural recruitment, balance control/stability) SLS attempt x 3 bilaterally  Tandem stance 1 min x 2 bilateral in // bars with occasional to moderate HHA at times   TherActivity (to improve stairs, ambulation, progressive mobility and transfers of daily life) 18 inch chair 5 x sit to stand no UE assist Standing, walking 10 feet, turning and return 10 ft to sit in 18 inch chair x 1 with SPC, x 1 independent with SBA Step on over and down 6 inch step WB on Lt leg x 2 , dropped to 4 inch x 15 due to difficulty on 6 inch step.  Performed with hand on bar (Rt  primarily)   TODAY'STREATMENT                                                                          DATE: 07/24/2023 Therex: Recumbent bike for ROM 7 mins level 4  BLE leg press 62# slow eccentrics 2x10; vc for proper LE placement LLE only leg press 2x10 37#; VC for proper positioning  LAQ machine attempted however terminated due to patient pain and weakness   Neuro: //bar tandem walking with UE hover x2 lengths;   Backwards stepping in //bar x3 with RUE assist only; increased time needed Seated SLR x10; vc for proper form and tc for quad set; tactile feedback of pillows behind back to reduce instances of compensatory lean back to lift leg   PATIENT EDUCATION:  Education details: HEP, POC Person educated: Patient Education method: Programmer, multimedia, Demonstration, Verbal cues, and Handouts Education comprehension: verbalized understanding, returned demonstration, and verbal cues required  HOME EXERCISE PROGRAM: Access Code: [email or web address] URL: [email or web address] Date: 07/01/2023 Prepared by: Narda Amber  Exercises - Sit to Stand with Counter Support  - 3 x daily - 7 x weekly - 10 reps - Seated Long Arc Quad  - 3 x daily - 7 x weekly - 2 sets - 10 reps - 3-5 seconds hold - Supine Active Straight Leg Raise  - 3 x daily - 7 x weekly - 2 sets - 10 reps - Supine Heel Slide with Strap  - 3 x daily - 7 x weekly - 2 sets - 10 reps - 3-5 seconds hold - Supine Quad Set on Towel Roll  - 3 x daily - 7  x weekly - 2 sets - 10 reps - 5 seconds hold  ASSESSMENT:  CLINICAL IMPRESSION:  The patient has attended 12 visits over the course of treatment cycle.  Patient has reported overall improvement at 75%.   See objective data above for updated information regarding current presentation. Pt has reported gym based attendance for continued work in last 3 weeks.  Recommend discharge to HEP at this time with gym attendance.  Pt in agreement.   OBJECTIVE IMPAIRMENTS: decreased activity  tolerance, decreased balance, decreased mobility, difficulty walking, decreased ROM, decreased strength, increased edema, impaired flexibility, and pain.   ACTIVITY LIMITATIONS: bending, sitting, standing, squatting, sleeping, stairs, transfers, bed mobility, bathing, toileting, and dressing  PARTICIPATION LIMITATIONS: meal prep, cleaning, driving, and community activity  PERSONAL FACTORS: 3+ comorbidities: see PMH above  are also affecting patient's functional outcome.   REHAB POTENTIAL: Good  CLINICAL DECISION MAKING: Stable/uncomplicated  EVALUATION COMPLEXITY: Low   GOALS: Goals reviewed with patient? Yes  SHORT TERM GOALS: (target date for Short term goals are 3 weeks 07/22/2023)   1.  Patient will demonstrate independent use of home exercise program to maintain progress from in clinic treatments.  Goal status: Partially Met (improve compliance) 07/09/2023  LONG TERM GOALS: (target dates for all long term goals are 10 weeks  09/09/2023 )   1. Patient will demonstrate/report pain at worst less than or equal to 2/10 to facilitate minimal limitation in daily activity secondary to pain symptoms.  Goal status: MET 07/24/2023   2. Patient will demonstrate independent use of home exercise program to facilitate ability to maintain/progress functional gains from skilled physical therapy services.  Goal status: Met 08/27/2023   3. Patient will demonstrate FOTO outcome > or = 55 % to indicate reduced disability due to condition.  Goal status: Met 08/27/2023   4.  Patient will demonstrate left  LE MMT >/= 4/5 throughout to faciltiate usual transfers, stairs, squatting at [facility] for daily life.   Goal status: Met 08/27/2023   5.  Patient will demonstrate up and down a flight of stairs with single hand rail with reciprocal gait pattern.  Goal status: mostly Met 08/27/2023   6.  Pt will improve his left knee active ROM to  from 5 to 120 degrees for improved functional mobility and gait.   Goal status: Met 08/27/2023   7.  Pt will be able to stand without the use of his UE 5 times in </= 15 seconds to improve functional mobility.  Goal Status: on going 07/29/2023   PLAN:  PT FREQUENCY: 2-3x/week  PT DURATION: 10 weeks  PLANNED INTERVENTIONS: Can include 10272- PT Re-evaluation, 97110-Therapeutic exercises, 97530- Therapeutic activity, 97112- Neuromuscular re-education, 97535- Self Care, 97140- Manual therapy, [phone]- Gait training,  [phone]- Aquatic Therapy, 97014- Electrical stimulation (unattended) 42595- Vasopneumatic device,   Patient/Family education, Balance training, Stair training, Taping, Dry Needling, Joint mobilization, Joint manipulation, Spinal manipulation, Spinal mobilization, Scar mobilization, Vestibular training, Visual/preceptual remediation/compensation, DME instructions, Cryotherapy, and Moist heat.  All performed as medically necessary.  All included unless contraindicated  PLAN FOR NEXT SESSION: Discharge to HEP   Chyrel Masson, PT, DPT, OCS, ATC 08/27/23  2:28 PM      Referring diagnosis?  G38.756 (ICD-10-CM) - Status post total left knee replacement  M17.12 (ICD-10-CM) - Primary osteoarthritis of left knee   Treatment diagnosis? (if different than referring diagnosis) M25.562, M62.81, R26.2, R60.0  What was this (referring dx) caused by? [x]  Surgery []  Fall []  Ongoing issue []  Arthritis []  Other:  ____________  Laterality: []  Rt [x]  Lt []  Both  Check all possible CPT codes: *CHOOSE 10 OR LESS* 97146- PT Re-evaluation, 97110-Therapeutic exercises, 97530- Therapeutic activity, [id number]- Neuromuscular re-education, 97535- Self Care, 40981- Manual therapy, [id number]- Gait training,  [phone]- Aquatic Therapy, 97014- Electrical stimulation (unattended) 97016- Vasopneumatic device,     See Planned Interventions listed in the Plan section of the Evaluation.

## 2023-09-04 ENCOUNTER — Encounter: Payer: Self-pay | Admitting: Family Medicine

## 2023-09-04 DIAGNOSIS — E1129 Type 2 diabetes mellitus with other diabetic kidney complication: Secondary | ICD-10-CM

## 2023-09-05 NOTE — Telephone Encounter (Signed)
 Attached the following info from 08/18/23 pt msg:  Lorel Monaco, [facility]     08/22/23  8:23 AM Unsigned Note Copied from CRM [id number]. Topic: General - Other >> Aug 21, 2023  5:09 PM Eunice Blase wrote: Reason for CRM: Received call from pt's wife stated pt needs Glucose meter insurance will pay for [employer]. [employer] ask to please  fax# [phone].Marland Kitchen

## 2023-09-09 ENCOUNTER — Ambulatory Visit (INDEPENDENT_AMBULATORY_CARE_PROVIDER_SITE_OTHER): Payer: Medicare HMO | Admitting: Physician Assistant

## 2023-09-09 ENCOUNTER — Encounter: Payer: Self-pay | Admitting: Physician Assistant

## 2023-09-09 DIAGNOSIS — Z96652 Presence of left artificial knee joint: Secondary | ICD-10-CM

## 2023-09-09 MED ORDER — TRAMADOL HCL 50 MG PO TABS: 50.0000 mg | ORAL_TABLET | Freq: Two times a day (BID) | ORAL | 1 refills | Status: DC | PRN

## 2023-09-09 NOTE — Progress Notes (Signed)
 Post-Op Visit Note   Patient: James Robbins.           Date of Birth: 19-Jul-1950           MRN: [id number] Visit Date: 09/09/2023 PCP: Eustaquio Boyden, MD   Assessment & Plan:  Chief Complaint:  Chief Complaint  Patient presents with   Left Knee - Follow-up    Left total knee arthroplasty 06/16/2023   Visit Diagnoses:  1. Status post total left knee replacement     Plan: Patient is a pleasant 73 year old gentleman who comes in today 3 months status post left total knee replacement 06/16/2023.  He has been doing well.  He has finished physical therapy and has joined the gym.  He does note occasional pain for which she takes Tylenol.  Examination of the left knee shows range of motion from 0 to 120 degrees.  Stable to valgus varus stress.  He is neurovascularly intact distally.  At this point, he will continue to increase activity as tolerated.  Continue to work on strengthening exercises at the gym.  Dental prophylaxis reinforced.  Follow-up in 3 months for repeat evaluation and 2 view x-rays of the left knee.  Call with concerns or questions.  Follow-Up Instructions: Return in about 3 months (around 12/09/2023).   Orders:  No orders of the defined types were placed in this encounter.  Meds ordered this encounter  Medications   traMADol (ULTRAM) 50 MG tablet    Sig: Take 1 tablet (50 mg total) by mouth every 12 (twelve) hours as needed.    Dispense:  30 tablet    Refill:  1    Imaging: No new imaging  PMFS History: Patient Active Problem List   Diagnosis Date Noted   Primary osteoarthritis of left knee 06/16/2023   Status post total left knee replacement 06/16/2023   Hypercoagulable state due to permanent atrial fibrillation (HCC) 01/01/2023   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) 01/01/2022   Hypertensive retinopathy of both eyes 01/01/2022   Vision loss of right eye 09/06/2021   Medicare annual wellness visit, subsequent 09/05/2021   Right  retinal artery branch occlusion 04/05/2021   Obstructive sleep apnea hypopnea, severe 11/07/2020   Permanent atrial fibrillation (HCC) 11/07/2020   Primary osteoarthritis of knees, bilateral 04/23/2019   Vitamin D deficiency 09/23/2016   Advanced care planning/counseling discussion 07/20/2015   Health maintenance examination 02/21/2014   Severe obesity (BMI 35.0-39.9) with comorbidity (HCC)    Type 2 diabetes mellitus with microalbuminuria, without long-term current use of insulin (HCC)    Seasonal allergic rhinitis 12/24/2011   Hypertension 12/24/2011   Hyperlipidemia associated with type 2 diabetes mellitus (HCC) 12/24/2011   Past Medical History:  Diagnosis Date   Atrial fibrillation (HCC)    Chronic kidney disease (CKD), stage I    proteinuria   Controlled type 2 diabetes mellitus with diabetic nephropathy (HCC)    Completed DSME 2017   Hematuria 2014   s/p normal urological workup ([place])   Hyperlipidemia    Hypertension    Obesity    Seasonal allergic rhinitis    Sleep apnea    has cpap machine; don't use    Family History  Problem Relation Age of Onset   Diabetes Mother    Hypertension Mother    Hyperlipidemia Mother    CAD Father        2v CABG, smoker   Osteoporosis Sister    Cancer Neg Hx  Past Surgical History:  Procedure Laterality Date   CARDIOVERSION N/A 11/27/2020   Procedure: CARDIOVERSION;  Surgeon: Nahser, Deloris Ping, MD;  Location: [facility] ENDOSCOPY;  Service: Cardiovascular;  Laterality: N/A;   CATARACT EXTRACTION Bilateral 08/2021   COLONOSCOPY  06/10/2004   per pt normal ([place])   TOTAL KNEE ARTHROPLASTY Left 06/16/2023   Procedure: LEFT TOTAL KNEE ARTHROPLASTY;  Surgeon: Tarry Kos, MD;  Location: [facility] OR;  Service: Orthopedics;  Laterality: Left;   VASECTOMY     VASECTOMY REVERSAL     Social History   Occupational History   Occupation: retired    Associate Professor: UNEMPLOYED    Comment: 2006  Tobacco Use   Smoking status: Former    Current  packs/day: 0.00    Types: Cigarettes    Quit date: 06/09/1986    Years since quitting: 37.2   Smokeless tobacco: Never  Vaping Use   Vaping status: Never Used  Substance and Sexual Activity   Alcohol use: No    Alcohol/week: 0.0 standard drinks of alcohol   Drug use: No   Sexual activity: Not Currently

## 2023-09-23 ENCOUNTER — Ambulatory Visit (INDEPENDENT_AMBULATORY_CARE_PROVIDER_SITE_OTHER): Payer: Medicare HMO

## 2023-09-23 VITALS — Ht 66.5 in | Wt 230.0 lb

## 2023-09-23 DIAGNOSIS — Z0000 Encounter for general adult medical examination without abnormal findings: Secondary | ICD-10-CM

## 2023-09-23 MED ORDER — TRUE METRIX METER W/DEVICE KIT: PACK | 0 refills | Status: AC

## 2023-09-23 NOTE — Telephone Encounter (Signed)
 Printed rx to fax to [employer], per pt's wife, Carolene Chute (on dpr).   Placed rx in Dr Ocie Belt box to sign.

## 2023-09-23 NOTE — Addendum Note (Signed)
 Addended by: Grove Defina on: 09/23/2023 11:25 AM   Modules accepted: Orders

## 2023-09-23 NOTE — Progress Notes (Addendum)
 Subjective:   James Robbins. is a 73 y.o. who presents for a Medicare Wellness preventive visit.  Visit Complete: Virtual I connected with  James Robbins. on 09/23/23 by a audio enabled telemedicine application and verified that I am speaking with the correct person using two identifiers.  Patient Location: Home  Provider Location: Office/Clinic  I discussed the limitations of evaluation and management by telemedicine. The patient expressed understanding and agreed to proceed.  Vital Signs: Because this visit was a virtual/telehealth visit, some criteria may be missing or patient reported. Any vitals not documented were not able to be obtained and vitals that have been documented are patient reported.  VideoDeclined- This patient declined Librarian, academic. Therefore the visit was completed with audio only.  Persons Participating in Visit: Patient.  AWV Questionnaire: No: Patient Medicare AWV questionnaire was not completed prior to this visit.  Cardiac Risk Factors include: advanced age (>28men, >60 women);diabetes mellitus;dyslipidemia;hypertension;male gender;obesity (BMI >30kg/m2)     Objective:    Today's Vitals   09/23/23 0813 09/23/23 0814  Weight: 230 lb (104.3 kg)   Height: 5' 6.5" (1.689 m)   PainSc:  2    Body mass index is 36.57 kg/m.     09/23/2023    8:29 AM 07/01/2023    2:10 PM 06/06/2023    3:11 PM 09/19/2022    8:51 AM 11/27/2020    9:03 AM 10/27/2020    8:12 PM  Advanced Directives  Does Patient Have a Medical Advance Directive? No No No No No No  Would patient like information on creating a medical advance directive?  No - Patient declined No - Patient declined No - Patient declined No - Patient declined No - Patient declined    Current Medications (verified) Outpatient Encounter Medications as of 09/23/2023  Medication Sig   amLODipine (NORVASC) 10 MG tablet Take 1 tablet (10 mg total) by mouth daily.    apixaban (ELIQUIS) 5 MG TABS tablet Take 1 tablet (5 mg total) by mouth 2 (two) times daily.   atorvastatin (LIPITOR) 40 MG tablet Take 1 tablet (40 mg total) by mouth daily.   cetirizine (ZYRTEC) 10 MG tablet Take 10 mg by mouth daily.   Cholecalciferol (VITAMIN D3) 25 MCG (1000 UT) CAPS Take 2 capsules (2,000 Units total) by mouth daily.   dapagliflozin propanediol (FARXIGA) 10 MG TABS tablet Take 1 tablet (10 mg total) by mouth daily before breakfast.   diclofenac Sodium (VOLTAREN) 1 % GEL Apply 1 application topically 4 (four) times daily as needed (pain).   docusate sodium (COLACE) 100 MG capsule Take 1 capsule (100 mg total) by mouth daily as needed.   doxycycline (VIBRAMYCIN) 100 MG capsule Take 1 capsule (100 mg total) by mouth 2 (two) times daily. To be taken after surgery   enalapril (VASOTEC) 20 MG tablet Take 1 tablet (20 mg total) by mouth daily.   fluticasone (FLONASE) 50 MCG/ACT nasal spray Place 1 spray into both nostrils daily as needed for allergies or rhinitis.   metFORMIN (GLUCOPHAGE) 1000 MG tablet Take 1 tablet (1,000 mg total) by mouth 2 (two) times daily with a meal.   methocarbamol (ROBAXIN-750) 750 MG tablet Take 1 tablet (750 mg total) by mouth 2 (two) times daily as needed for muscle spasms.   metoprolol succinate (TOPROL-XL) 50 MG 24 hr tablet TAKE 1 TABLET BY MOUTH ONCE DAILY. TAKE WITH OR IMMEDIATELY FOLLOWING A MEAL   Tuberculin-Allergy Syringes (ALLERGY SYRINGE 1CC/27GX1/2") 27G X  1/2" 1 ML MISC See admin instructions.   methocarbamol (ROBAXIN-750) 750 MG tablet Take 1 tablet (750 mg total) by mouth 2 (two) times daily as needed for muscle spasms. (Patient not taking: Reported on 09/23/2023)   ondansetron (ZOFRAN) 4 MG tablet Take 1 tablet (4 mg total) by mouth every 8 (eight) hours as needed for nausea or vomiting. (Patient not taking: Reported on 09/23/2023)   oxyCODONE (ROXICODONE) 5 MG immediate release tablet Take 1-2 tablets (5-10 mg total) by mouth every 6 (six)  hours as needed. To be taken after surgery (Patient not taking: Reported on 09/23/2023)   traMADol (ULTRAM) 50 MG tablet Take 1 tablet (50 mg total) by mouth every 12 (twelve) hours as needed. (Patient not taking: Reported on 09/23/2023)   No facility-administered encounter medications on file as of 09/23/2023.    Allergies (verified) Patient has no known allergies.   History: Past Medical History:  Diagnosis Date   Atrial fibrillation (HCC)    Chronic kidney disease (CKD), stage I    proteinuria   Controlled type 2 diabetes mellitus with diabetic nephropathy (HCC)    Completed DSME 2017   Hematuria 2014   s/p normal urological workup ([place])   Hyperlipidemia    Hypertension    Obesity    Seasonal allergic rhinitis    Sleep apnea    has cpap machine; don't use   Past Surgical History:  Procedure Laterality Date   CARDIOVERSION N/A 11/27/2020   Procedure: CARDIOVERSION;  Surgeon: Nahser, Deloris Ping, MD;  Location: [facility] ENDOSCOPY;  Service: Cardiovascular;  Laterality: N/A;   CATARACT EXTRACTION Bilateral 08/2021   COLONOSCOPY  06/10/2004   per pt normal ([place])   TOTAL KNEE ARTHROPLASTY Left 06/16/2023   Procedure: LEFT TOTAL KNEE ARTHROPLASTY;  Surgeon: Tarry Kos, MD;  Location: [facility] OR;  Service: Orthopedics;  Laterality: Left;   VASECTOMY     VASECTOMY REVERSAL     Family History  Problem Relation Age of Onset   Diabetes Mother    Hypertension Mother    Hyperlipidemia Mother    CAD Father        45v CABG, smoker   Osteoporosis Sister    Cancer Neg Hx    Social History   Socioeconomic History   Marital status: Married    Spouse name: Not on file   Number of children: 2   Years of education: 14   Highest education level: Not on file  Occupational History   Occupation: retired    Associate Professor: UNEMPLOYED    Comment: 2006  Tobacco Use   Smoking status: Former    Current packs/day: 0.00    Types: Cigarettes    Quit date: 06/09/1986    Years since quitting: 37.3    Smokeless tobacco: Never  Vaping Use   Vaping status: Never Used  Substance and Sexual Activity   Alcohol use: No    Alcohol/week: 0.0 standard drinks of alcohol   Drug use: No   Sexual activity: Not Currently  Other Topics Concern   Not on file  Social History Narrative   Lives with wife and children, no pets   Occupation: retired Forensic scientist   Edu: HS   Activity: no regular exercise   Diet: good water, fruits/vegetables daily, stopped sodas   Social Drivers of Corporate investment banker Strain: Low Risk  (09/23/2023)   Overall Financial Resource Strain (CARDIA)    Difficulty of Paying Living Expenses: Not hard at all  Food Insecurity: No Food Insecurity (09/23/2023)  Hunger Vital Sign    Worried About Running Out of Food in the Last Year: Never true    Ran Out of Food in the Last Year: Never true  Transportation Needs: No Transportation Needs (09/23/2023)   PRAPARE - Administrator, Civil Service (Medical): No    Lack of Transportation (Non-Medical): No  Physical Activity: Insufficiently Active (09/23/2023)   Exercise Vital Sign    Days of Exercise per Week: 3 days    Minutes of Exercise per Session: 30 min  Stress: No Stress Concern Present (09/23/2023)   [employer] of Occupational Health - Occupational Stress Questionnaire    Feeling of Stress : Not at all  Social Connections: Moderately Integrated (09/23/2023)   Social Connection and Isolation Panel [NHANES]    Frequency of Communication with Friends and Family: Three times a week    Frequency of Social Gatherings with Friends and Family: More than three times a week    Attends Religious Services: More than 4 times per year    Active Member of [employer] or Organizations: No    Attends Engineer, structural: Never    Marital Status: Married    Tobacco Counseling Counseling given: Not Answered    Clinical Intake:  Pre-visit preparation completed: Yes  Pain : 0-10 Pain Score: 2  Pain Type:  Chronic pain Pain Location: Knee Pain Orientation: Left Pain Descriptors / Indicators: Aching Pain Onset: More than a month ago Pain Frequency: Intermittent Pain Relieving Factors: exercises, medications  Pain Relieving Factors: exercises, medications  BMI - recorded: 36.57 Nutritional Status: BMI > 30  Obese Nutritional Risks: None Diabetes: Yes CBG done?: No Did pt. bring in CBG monitor from home?: No  Lab Results  Component Value Date   HGBA1C 5.9 (A) 05/12/2023   HGBA1C 6.3 10/01/2022   HGBA1C 5.7 (A) 05/06/2022     How often do you need to have someone help you when you read instructions, pamphlets, or other written materials from your doctor or pharmacy?: 1 - Never  Interpreter Needed?: No  Comments: lives with wife Information entered by :: B.Shiree Altemus,LPN   Activities of Daily Living     09/23/2023    8:30 AM 06/06/2023    3:15 PM  In your present state of health, do you have any difficulty performing the following activities:  Hearing? 1   Vision? 0   Difficulty concentrating or making decisions? 0   Walking or climbing stairs? 1   Dressing or bathing? 0   Doing errands, shopping? 0 0  Preparing Food and eating ? N   Using the Toilet? N   In the past six months, have you accidently leaked urine? N   Do you have problems with loss of bowel control? N   Managing your Medications? N   Managing your Finances? N   Housekeeping or managing your Housekeeping? N     Patient Care Team: Claire Crick, MD as PCP - General (Family Medicine)  Indicate any recent Medical Services you may have received from other than [facility] providers in the past year (date may be approximate).     Assessment:   This is a routine wellness examination for James Robbins.  Hearing/Vision screen Hearing Screening - Comments:: Pt says his hearing is less due to career/job but sufficient right now Vision Screening - Comments:: Pt says his vision is 20/20 after cataract surgery   Goals  Addressed             This Visit's Progress  Patient Stated   On track    Get knees replaced.Has had left knee replaced. Rt knee to be done last summer     Weight (lb) < 200 lb (90.7 kg)   230 lb (104.3 kg)    Pt says he would like be around 200lbs.       Depression Screen     09/23/2023    8:25 AM 09/19/2022    8:51 AM 09/05/2021   11:50 AM 04/26/2020   10:25 AM 04/23/2019    9:38 AM 04/15/2018   11:43 AM 07/20/2015   11:33 AM  PHQ 2/9 Scores  PHQ - 2 Score 0 0 0 0 0 0 0    Fall Risk     09/23/2023    8:18 AM 09/19/2022    8:44 AM 09/05/2021   11:50 AM 07/20/2015   11:33 AM  Fall Risk   Falls in the past year? 0 0 0 No  Number falls in past yr: 0 0    Injury with Fall? 0 0    Risk for fall due to : No Fall Risks No Fall Risks    Follow up Education provided;Falls prevention discussed Falls prevention discussed;Falls evaluation completed      MEDICARE RISK AT HOME:  Medicare Risk at Home Any stairs in or around the home?: Yes If so, are there any without handrails?: Yes Home free of loose throw rugs in walkways, pet beds, electrical cords, etc?: Yes Adequate lighting in your home to reduce risk of falls?: Yes Life alert?: No Use of a cane, walker or w/c?: Yes (cane) Grab bars in the bathroom?: Yes Shower chair or bench in shower?: Yes Elevated toilet seat or a handicapped toilet?: No  TIMED UP AND GO:  Was the test performed?  No  Cognitive Function: 6CIT completed        09/23/2023    8:39 AM 09/19/2022    8:54 AM  6CIT Screen  What Year? 0 points 0 points  What month? 0 points 0 points  What time? 0 points 0 points  Count back from 20 0 points 0 points  Months in reverse 0 points 0 points  Repeat phrase 0 points 0 points  Total Score 0 points 0 points    Immunizations Immunization History  Administered Date(s) Administered   Fluad Quad(high Dose 65+) 04/29/2023   Influenza Split 02/05/2011   Influenza, High Dose Seasonal PF 04/19/2016,  04/25/2017, 03/11/2018, 03/22/2019, 03/10/2020, 03/23/2021   Influenza-Unspecified 02/23/2015, 03/10/2020   PFIZER Comirnaty(Gray Top)Covid-19 Tri-Sucrose Vaccine 01/27/2023   PFIZER(Purple Top)SARS-COV-2 Vaccination 08/02/2019, 08/23/2019, 03/25/2020   Pfizer Covid-19 Vaccine Bivalent Booster 59yrs & up 07/12/2021   Pneumococcal Conjugate-13 02/21/2014   Pneumococcal Polysaccharide-23 07/20/2015   Zoster Recombinant(Shingrix) 10/05/2021   Zoster, Live 06/10/2010    Screening Tests Health Maintenance  Topic Date Due   DTaP/Tdap/Td (1 - Tdap) Never done   Zoster Vaccines- Shingrix (2 of 2) 11/30/2021   Diabetic kidney evaluation - Urine ACR  08/30/2022   COVID-19 Vaccine (6 - 2024-25 season) 03/24/2023   HEMOGLOBIN A1C  11/10/2023   OPHTHALMOLOGY EXAM  11/14/2023   INFLUENZA VACCINE  01/09/2024   FOOT EXAM  05/11/2024   Diabetic kidney evaluation - eGFR measurement  06/05/2024   Fecal DNA (Cologuard)  08/27/2024   Medicare Annual Wellness (AWV)  09/22/2024   Pneumonia Vaccine 5+ Years old  Completed   Hepatitis C Screening  Completed   HPV VACCINES  Aged Out   Meningococcal B Vaccine  Aged  Out    Health Maintenance  Health Maintenance Due  Topic Date Due   DTaP/Tdap/Td (1 - Tdap) Never done   Zoster Vaccines- Shingrix (2 of 2) 11/30/2021   Diabetic kidney evaluation - Urine ACR  08/30/2022   COVID-19 Vaccine (6 - 2024-25 season) 03/24/2023   Health Maintenance Items Addressed: None needed  Additional Screening:  Vision Screening: Recommended annual ophthalmology exams for early detection of glaucoma and other disorders of the eye.  Dental Screening: Recommended annual dental exams for proper oral hygiene  Community Resource Referral / Chronic Care Management: CRR required this visit?  No   CCM required this visit?  Appt scheduled with PCP     Plan:     I have personally reviewed and noted the following in the patient's chart:   Medical and social  history Use of alcohol, tobacco or illicit drugs  Current medications and supplements including opioid prescriptions. Patient is not currently taking opioid prescriptions. Functional ability and status Nutritional status Physical activity Advanced directives List of other physicians Hospitalizations, surgeries, and ER visits in previous 12 months Vitals Screenings to include cognitive, depression, and falls Referrals and appointments  In addition, I have reviewed and discussed with patient certain preventive protocols, quality metrics, and best practice recommendations. A written personalized care plan for preventive services as well as general preventive health recommendations were provided to patient.     James Bannister, LPN   1/61/0960   After Visit Summary: (MyChart) Due to this being a telephonic visit, the after visit summary with patients personalized plan was offered to patient via MyChart   Notes: Nothing significant to report at this time.

## 2023-09-23 NOTE — Telephone Encounter (Signed)
 Faxed rx to [facility] at [phone] (fax # provided by pt's wife).

## 2023-09-23 NOTE — Patient Instructions (Addendum)
 Mr. James Robbins , Thank you for taking time to come for your Medicare Wellness Visit. I appreciate your ongoing commitment to your health goals. Please review the following plan we discussed and let me know if I can assist you in the future.   Referrals/Orders/Follow-Ups/Clinician Recommendations:   Pt needs glucometer (current broke or lost). sent msg to PCP.  This is a list of the screening recommended for you and due dates:  Health Maintenance  Topic Date Due   DTaP/Tdap/Td vaccine (1 - Tdap) Never done   Zoster (Shingles) Vaccine (2 of 2) 11/30/2021   Yearly kidney health urinalysis for diabetes  08/30/2022   COVID-19 Vaccine (6 - 2024-25 season) 03/24/2023   Hemoglobin A1C  11/10/2023   Eye exam for diabetics  11/14/2023   Flu Shot  01/09/2024   Complete foot exam   05/11/2024   Yearly kidney function blood test for diabetes  06/05/2024   Cologuard (Stool DNA test)  08/27/2024   Medicare Annual Wellness Visit  09/22/2024   Pneumonia Vaccine  Completed   Hepatitis C Screening  Completed   HPV Vaccine  Aged Out   Meningitis B Vaccine  Aged Out    Advanced directives: (Declined) Advance directive discussed with you today. Even though you declined this today, please call our office should you change your mind, and we can give you the proper paperwork for you to fill out.  Next Medicare Annual Wellness Visit scheduled for next year: Yes 09/24/23 @ 8:10am televisit

## 2023-09-27 DIAGNOSIS — E1129 Type 2 diabetes mellitus with other diabetic kidney complication: Secondary | ICD-10-CM | POA: Diagnosis not present

## 2023-09-27 DIAGNOSIS — R809 Proteinuria, unspecified: Secondary | ICD-10-CM | POA: Diagnosis not present

## 2023-10-15 ENCOUNTER — Other Ambulatory Visit: Payer: Self-pay | Admitting: Family Medicine

## 2023-10-15 DIAGNOSIS — I10 Essential (primary) hypertension: Secondary | ICD-10-CM

## 2023-10-22 ENCOUNTER — Other Ambulatory Visit: Payer: Self-pay | Admitting: Family Medicine

## 2023-10-22 DIAGNOSIS — E785 Hyperlipidemia, unspecified: Secondary | ICD-10-CM

## 2023-10-23 ENCOUNTER — Encounter: Payer: Self-pay | Admitting: Pharmacist

## 2023-10-23 NOTE — Progress Notes (Signed)
 Pharmacy Quality Measure Review  This patient is appearing on a report for being at risk of failing the adherence measure for diabetes medications this calendar year.   Medication:  Metformin  1000 mg  Last fill date: 08/23/23 for 30 day supply  Insurance report was not up to date. No action needed at this time.  Medication has been refilled as of 09/22/23 for 90 day supply.  1 additional refill remaining on current prescription.

## 2023-11-04 ENCOUNTER — Other Ambulatory Visit: Payer: Self-pay | Admitting: Family Medicine

## 2023-11-04 DIAGNOSIS — E1129 Type 2 diabetes mellitus with other diabetic kidney complication: Secondary | ICD-10-CM

## 2023-11-04 DIAGNOSIS — I10 Essential (primary) hypertension: Secondary | ICD-10-CM

## 2023-11-18 DIAGNOSIS — J3089 Other allergic rhinitis: Secondary | ICD-10-CM | POA: Diagnosis not present

## 2023-11-18 DIAGNOSIS — J3081 Allergic rhinitis due to animal (cat) (dog) hair and dander: Secondary | ICD-10-CM | POA: Diagnosis not present

## 2023-11-18 DIAGNOSIS — J301 Allergic rhinitis due to pollen: Secondary | ICD-10-CM | POA: Diagnosis not present

## 2023-11-18 DIAGNOSIS — S60561A Insect bite (nonvenomous) of right hand, initial encounter: Secondary | ICD-10-CM | POA: Diagnosis not present

## 2023-11-25 DIAGNOSIS — J301 Allergic rhinitis due to pollen: Secondary | ICD-10-CM | POA: Diagnosis not present

## 2023-11-25 DIAGNOSIS — J3089 Other allergic rhinitis: Secondary | ICD-10-CM | POA: Diagnosis not present

## 2023-12-03 ENCOUNTER — Other Ambulatory Visit: Payer: Self-pay | Admitting: Family Medicine

## 2023-12-03 DIAGNOSIS — E1129 Type 2 diabetes mellitus with other diabetic kidney complication: Secondary | ICD-10-CM

## 2023-12-03 NOTE — Telephone Encounter (Unsigned)
 Copied from CRM [id number]. Topic: Clinical - Medication Refill >> Dec 03, 2023  2:40 PM Burnard DEL wrote: Medication: dapagliflozin  propanediol (FARXIGA ) 10 MG TABS tablet  Has the patient contacted their pharmacy? Yes (Agent: If no, request that the patient contact the pharmacy for the refill. If patient does not wish to contact the pharmacy document the reason why and proceed with request.) (Agent: If yes, when and what did the pharmacy advise?)  This is the patient's preferred pharmacy:    [facility] - [place], [place] - [address] [place] [address] Phone: [phone] Fax: [phone]    Is this the correct pharmacy for this prescription? Yes If no, delete pharmacy and type the correct one.   Has the prescription been filled recently? Yes  Is the patient out of the medication? No(last refill just received)  Has the patient been seen for an appointment in the last year OR does the patient have an upcoming appointment? Yes  Can we respond through MyChart? Yes  Agent: Please be advised that Rx refills may take up to 3 business days. We ask that you follow-up with your pharmacy.

## 2023-12-03 NOTE — Telephone Encounter (Signed)
 Spoke with pt's wife, Cora (on dpr) to get clarification for refill request since pt gets med via [facility] PAP. She states pt has received notification a new rx needs to be sent.

## 2023-12-04 DIAGNOSIS — J3081 Allergic rhinitis due to animal (cat) (dog) hair and dander: Secondary | ICD-10-CM | POA: Diagnosis not present

## 2023-12-04 DIAGNOSIS — J301 Allergic rhinitis due to pollen: Secondary | ICD-10-CM | POA: Diagnosis not present

## 2023-12-04 DIAGNOSIS — J3089 Other allergic rhinitis: Secondary | ICD-10-CM | POA: Diagnosis not present

## 2023-12-04 NOTE — Telephone Encounter (Addendum)
 Farxiga  Last rx:  05/28/23, #30 Last OV:  05/12/23, DM Next OV:  12/16/23, CPE

## 2023-12-06 ENCOUNTER — Other Ambulatory Visit: Payer: Self-pay | Admitting: Family Medicine

## 2023-12-06 DIAGNOSIS — Z125 Encounter for screening for malignant neoplasm of prostate: Secondary | ICD-10-CM

## 2023-12-06 DIAGNOSIS — E559 Vitamin D deficiency, unspecified: Secondary | ICD-10-CM

## 2023-12-06 DIAGNOSIS — E1129 Type 2 diabetes mellitus with other diabetic kidney complication: Secondary | ICD-10-CM

## 2023-12-06 DIAGNOSIS — I4821 Permanent atrial fibrillation: Secondary | ICD-10-CM

## 2023-12-06 DIAGNOSIS — E785 Hyperlipidemia, unspecified: Secondary | ICD-10-CM

## 2023-12-08 ENCOUNTER — Other Ambulatory Visit (INDEPENDENT_AMBULATORY_CARE_PROVIDER_SITE_OTHER)

## 2023-12-08 DIAGNOSIS — E559 Vitamin D deficiency, unspecified: Secondary | ICD-10-CM

## 2023-12-08 DIAGNOSIS — Z125 Encounter for screening for malignant neoplasm of prostate: Secondary | ICD-10-CM

## 2023-12-08 DIAGNOSIS — E785 Hyperlipidemia, unspecified: Secondary | ICD-10-CM | POA: Diagnosis not present

## 2023-12-08 DIAGNOSIS — E1129 Type 2 diabetes mellitus with other diabetic kidney complication: Secondary | ICD-10-CM | POA: Diagnosis not present

## 2023-12-08 DIAGNOSIS — R809 Proteinuria, unspecified: Secondary | ICD-10-CM

## 2023-12-08 DIAGNOSIS — E1169 Type 2 diabetes mellitus with other specified complication: Secondary | ICD-10-CM | POA: Diagnosis not present

## 2023-12-08 DIAGNOSIS — I4821 Permanent atrial fibrillation: Secondary | ICD-10-CM

## 2023-12-08 LAB — CBC WITH DIFFERENTIAL/PLATELET
Basophils Absolute: 0 10*3/uL (ref 0.0–0.1)
Basophils Relative: 0.7 % (ref 0.0–3.0)
Eosinophils Absolute: 0.1 10*3/uL (ref 0.0–0.7)
Eosinophils Relative: 1.3 % (ref 0.0–5.0)
HCT: 41.9 % (ref 39.0–52.0)
Hemoglobin: 13.9 g/dL (ref 13.0–17.0)
Lymphocytes Relative: 18.7 % (ref 12.0–46.0)
Lymphs Abs: 1.3 10*3/uL (ref 0.7–4.0)
MCHC: 33.2 g/dL (ref 30.0–36.0)
MCV: 86.4 fl (ref 78.0–100.0)
Monocytes Absolute: 0.6 10*3/uL (ref 0.1–1.0)
Monocytes Relative: 8.1 % (ref 3.0–12.0)
Neutro Abs: 5.1 10*3/uL (ref 1.4–7.7)
Neutrophils Relative %: 71.2 % (ref 43.0–77.0)
Platelets: 222 10*3/uL (ref 150.0–400.0)
RBC: 4.85 Mil/uL (ref 4.22–5.81)
RDW: 15 % (ref 11.5–15.5)
WBC: 7.1 10*3/uL (ref 4.0–10.5)

## 2023-12-08 LAB — MICROALBUMIN / CREATININE URINE RATIO
Creatinine,U: 75.4 mg/dL
Microalb Creat Ratio: 789.3 mg/g — ABNORMAL HIGH (ref 0.0–30.0)
Microalb, Ur: 59.5 mg/dL — ABNORMAL HIGH (ref 0.0–1.9)

## 2023-12-08 LAB — LIPID PANEL
Cholesterol: 119 mg/dL (ref 0–200)
HDL: 35 mg/dL — ABNORMAL LOW (ref >39.00)
LDL Cholesterol: 66 mg/dL (ref 0–99)
NonHDL: 83.59
Total CHOL/HDL Ratio: 3
Triglycerides: 90 mg/dL (ref 0.0–149.0)
VLDL: 18 mg/dL (ref 0.0–40.0)

## 2023-12-08 LAB — VITAMIN D 25 HYDROXY (VIT D DEFICIENCY, FRACTURES): VITD: 43.72 ng/mL (ref 30.00–100.00)

## 2023-12-08 LAB — PSA: PSA: 1.24 ng/mL (ref 0.10–4.00)

## 2023-12-08 LAB — COMPREHENSIVE METABOLIC PANEL WITH GFR
ALT: 14 U/L (ref 0–53)
AST: 12 U/L (ref 0–37)
Albumin: 4.2 g/dL (ref 3.5–5.2)
Alkaline Phosphatase: 60 U/L (ref 39–117)
BUN: 21 mg/dL (ref 6–23)
CO2: 30 meq/L (ref 19–32)
Calcium: 10.3 mg/dL (ref 8.4–10.5)
Chloride: 102 meq/L (ref 96–112)
Creatinine, Ser: 0.92 mg/dL (ref 0.40–1.50)
GFR: 81.71 mL/min (ref >60.00)
Glucose, Bld: 163 mg/dL — ABNORMAL HIGH (ref 70–99)
Potassium: 4.2 meq/L (ref 3.5–5.1)
Sodium: 139 meq/L (ref 135–145)
Total Bilirubin: 0.6 mg/dL (ref 0.2–1.2)
Total Protein: 6.4 g/dL (ref 6.0–8.3)

## 2023-12-08 LAB — HEMOGLOBIN A1C: Hgb A1c MFr Bld: 7 % — ABNORMAL HIGH (ref 4.6–6.5)

## 2023-12-08 MED ORDER — DAPAGLIFLOZIN PROPANEDIOL 10 MG PO TABS: 10.0000 mg | ORAL_TABLET | Freq: Every day | ORAL | 11 refills | Status: AC

## 2023-12-09 ENCOUNTER — Ambulatory Visit: Admitting: Physician Assistant

## 2023-12-09 ENCOUNTER — Other Ambulatory Visit (INDEPENDENT_AMBULATORY_CARE_PROVIDER_SITE_OTHER): Payer: Self-pay

## 2023-12-09 ENCOUNTER — Other Ambulatory Visit

## 2023-12-09 DIAGNOSIS — J3089 Other allergic rhinitis: Secondary | ICD-10-CM | POA: Diagnosis not present

## 2023-12-09 DIAGNOSIS — J301 Allergic rhinitis due to pollen: Secondary | ICD-10-CM | POA: Diagnosis not present

## 2023-12-09 DIAGNOSIS — J3081 Allergic rhinitis due to animal (cat) (dog) hair and dander: Secondary | ICD-10-CM | POA: Diagnosis not present

## 2023-12-09 DIAGNOSIS — Z96652 Presence of left artificial knee joint: Secondary | ICD-10-CM

## 2023-12-09 NOTE — Progress Notes (Signed)
 Post-Op Visit Note   Patient: James Robbins.           Date of Birth: 23-May-1951           MRN: [id number] Visit Date: 12/09/2023 PCP: Rilla Baller, MD   Assessment & Plan:  Chief Complaint:  Chief Complaint  Patient presents with   Left Knee - Follow-up   Visit Diagnoses:  1. History of left knee replacement     Plan: Patient is a pleasant 73 year old gentleman who comes in today 6 months status post left total knee replacement 06/16/2023.  He has been doing well.  Some occasional pain that alternates between both knees.  Examination of the left knee reveals range of motion from approximately 0 to 120 degrees.  He is stable valgus varus stress.  He is neurovascularly intact distally.  At this point, he will continue to advance with activity as tolerated.  Dental prophylaxis reinforced.  Follow-up in 6 months for repeat evaluation and 2 view x-rays of the left knee.  Call with concerns or questions.  Follow-Up Instructions: Return in about 6 months (around 06/10/2024).   Orders:  Orders Placed This Encounter  Procedures   XR Knee 1-2 Views Left   No orders of the defined types were placed in this encounter.   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) 01/01/2022   Hypertensive retinopathy of both eyes 01/01/2022   Vision loss of right eye 09/06/2021   Medicare annual wellness visit, subsequent 09/05/2021   Right retinal artery branch occlusion 04/05/2021   Obstructive sleep apnea hypopnea, severe 11/07/2020   Permanent atrial fibrillation (HCC) 11/07/2020   Primary osteoarthritis of knees, bilateral  04/23/2019   Vitamin D  deficiency 09/23/2016   Advanced care planning/counseling discussion 07/20/2015   Health maintenance examination 02/21/2014   Severe obesity (BMI 35.0-39.9) with comorbidity (HCC)    Type 2 diabetes mellitus with microalbuminuria, without long-term current use of insulin  (HCC)    Seasonal allergic rhinitis 12/24/2011   Hypertension 12/24/2011   Hyperlipidemia associated with type 2 diabetes mellitus (HCC) 12/24/2011   Past Medical History:  Diagnosis Date   Atrial fibrillation (HCC)    Chronic kidney disease (CKD), stage I    proteinuria   Controlled type 2 diabetes mellitus with diabetic nephropathy (HCC)    Completed DSME 2017   Hematuria 2014   s/p normal urological workup ([place])   Hyperlipidemia    Hypertension    Obesity    Seasonal allergic rhinitis    Sleep apnea    has cpap machine; don't use    Family History  Problem Relation Age of Onset   Diabetes Mother    Hypertension Mother    Hyperlipidemia Mother    CAD Father        61v CABG, smoker   Osteoporosis Sister    Cancer Neg Hx     Past Surgical History:  Procedure Laterality Date   CARDIOVERSION N/A 11/27/2020   Procedure: CARDIOVERSION;  Surgeon: Nahser, Aleene PARAS, MD;  Location: [facility] ENDOSCOPY;  Service: Cardiovascular;  Laterality: N/A;   CATARACT EXTRACTION Bilateral 08/2021   COLONOSCOPY  06/10/2004   per pt normal ([place])  TOTAL KNEE ARTHROPLASTY Left 06/16/2023   Procedure: LEFT TOTAL KNEE ARTHROPLASTY;  Surgeon: Jerri Kay HERO, MD;  Location: [facility] OR;  Service: Orthopedics;  Laterality: Left;   VASECTOMY     VASECTOMY REVERSAL     Social History   Occupational History   Occupation: retired    Associate Professor: UNEMPLOYED    Comment: 2006  Tobacco Use   Smoking status: Former    Current packs/day: 0.00    Types: Cigarettes    Quit date: 06/09/1986    Years since quitting: 37.5   Smokeless tobacco: Never  Vaping Use   Vaping status: Never Used  Substance and Sexual Activity    Alcohol use: No    Alcohol/week: 0.0 standard drinks of alcohol   Drug use: No   Sexual activity: Not Currently

## 2023-12-13 ENCOUNTER — Encounter: Payer: Self-pay | Admitting: Family Medicine

## 2023-12-13 ENCOUNTER — Ambulatory Visit: Payer: Self-pay | Admitting: Family Medicine

## 2023-12-16 ENCOUNTER — Ambulatory Visit: Admitting: Family Medicine

## 2023-12-16 ENCOUNTER — Encounter: Payer: Self-pay | Admitting: Family Medicine

## 2023-12-16 VITALS — BP 132/66 | HR 84 | Temp 98.0°F | Ht 66.0 in | Wt 230.2 lb

## 2023-12-16 DIAGNOSIS — Z0000 Encounter for general adult medical examination without abnormal findings: Secondary | ICD-10-CM

## 2023-12-16 DIAGNOSIS — E1121 Type 2 diabetes mellitus with diabetic nephropathy: Secondary | ICD-10-CM

## 2023-12-16 DIAGNOSIS — I10 Essential (primary) hypertension: Secondary | ICD-10-CM | POA: Diagnosis not present

## 2023-12-16 DIAGNOSIS — R809 Proteinuria, unspecified: Secondary | ICD-10-CM | POA: Diagnosis not present

## 2023-12-16 DIAGNOSIS — Z0001 Encounter for general adult medical examination with abnormal findings: Secondary | ICD-10-CM

## 2023-12-16 DIAGNOSIS — E559 Vitamin D deficiency, unspecified: Secondary | ICD-10-CM

## 2023-12-16 DIAGNOSIS — E1169 Type 2 diabetes mellitus with other specified complication: Secondary | ICD-10-CM

## 2023-12-16 DIAGNOSIS — D6869 Other thrombophilia: Secondary | ICD-10-CM | POA: Diagnosis not present

## 2023-12-16 DIAGNOSIS — G4733 Obstructive sleep apnea (adult) (pediatric): Secondary | ICD-10-CM

## 2023-12-16 DIAGNOSIS — E1129 Type 2 diabetes mellitus with other diabetic kidney complication: Secondary | ICD-10-CM

## 2023-12-16 DIAGNOSIS — I4821 Permanent atrial fibrillation: Secondary | ICD-10-CM

## 2023-12-16 DIAGNOSIS — E785 Hyperlipidemia, unspecified: Secondary | ICD-10-CM

## 2023-12-16 DIAGNOSIS — Z7189 Other specified counseling: Secondary | ICD-10-CM

## 2023-12-16 MED ORDER — METOPROLOL SUCCINATE ER 50 MG PO TB24: ORAL_TABLET | ORAL | 3 refills | Status: AC

## 2023-12-16 MED ORDER — ENALAPRIL MALEATE 20 MG PO TABS
20.0000 mg | ORAL_TABLET | Freq: Every day | ORAL | 3 refills | Status: AC
Start: 2023-12-16 — End: ?

## 2023-12-16 MED ORDER — AMLODIPINE BESYLATE 10 MG PO TABS
10.0000 mg | ORAL_TABLET | Freq: Every day | ORAL | 3 refills | Status: AC
Start: 2023-12-16 — End: ?

## 2023-12-16 MED ORDER — TRULICITY 0.75 MG/0.5ML ~~LOC~~ SOAJ
0.7500 mg | SUBCUTANEOUS | 0 refills | Status: DC
Start: 2023-12-16 — End: 2024-03-17

## 2023-12-16 MED ORDER — ATORVASTATIN CALCIUM 40 MG PO TABS: 40.0000 mg | ORAL_TABLET | Freq: Every day | ORAL | 3 refills | Status: AC

## 2023-12-16 MED ORDER — METFORMIN HCL 1000 MG PO TABS: 1000.0000 mg | ORAL_TABLET | Freq: Two times a day (BID) | ORAL | 3 refills | Status: DC

## 2023-12-16 MED ORDER — TRULICITY 1.5 MG/0.5ML ~~LOC~~ SOAJ
1.5000 mg | SUBCUTANEOUS | 6 refills | Status: AC
Start: 2024-01-13 — End: ?

## 2023-12-16 NOTE — Assessment & Plan Note (Addendum)
 Start GLP1RA - see below.  Obesity complicated by comorbidities of hypertension, diabetes, hyperlipidemia, osteoarthritis, sleep apnea.

## 2023-12-16 NOTE — Assessment & Plan Note (Signed)
 Chronic, stable. Continue current regimen.

## 2023-12-16 NOTE — Assessment & Plan Note (Signed)
 Well controlled on vit D replacement 2000 units daily.

## 2023-12-16 NOTE — Patient Instructions (Addendum)
 Continue metformin  and farxiga  Price out Trulicity  - start 0.75mg  weekly for 1 month then increase to 1.5mg  weekly.  Work on advanced directive, bring us  a copy when complete. Advanced directive packet provided today.  Repeat urine test today as your urine protein levels were very high when recently checked. If staying elevated I may have you do 24 hour urine collection.  Return in 3 months for diabetes follow up visit

## 2023-12-16 NOTE — Assessment & Plan Note (Addendum)
 Chronic, severe, followed by pulmonology, intolerant to several CPAP masks tried, told not candidate for Inspire likely due to elevated BMI.  Will work towards weight loss as per below.

## 2023-12-16 NOTE — Progress Notes (Signed)
 Ph: [phone] Fax: [phone]   Patient ID: James DELENA Cleotilde Mickey., male    DOB: [birth year], 73 y.o.   MRN: [id number]  This visit was conducted in person.  BP 132/66   Pulse 84   Temp 98 F (36.7 C) (Oral)   Ht 5' 6 (1.676 m)   Wt 230 lb 4 oz (104.4 kg)   SpO2 97%   BMI 37.16 kg/m    CC: CPE Subjective:   HPI: James A Jarick Harkins. is a 73 y.o. male presenting on 12/16/2023 for Annual Exam (MCR prt 2 [AWV- 09/23/23]. Pt accompanied by wife, James Robbins. )   Saw health advisor 09/2023 for medicare wellness visit. Note reviewed.   No results found.  Flowsheet Row Office Visit from 12/16/2023 in [facility] [place]  PHQ-2 Total Score 0       12/16/2023    4:02 PM 09/23/2023    8:18 AM 09/19/2022    8:44 AM 09/05/2021   11:50 AM 07/20/2015   11:33 AM  Fall Risk   Falls in the past year? 0 0 0 0 No   Number falls in past yr:  0 0    Injury with Fall?  0 0    Risk for fall due to :  No Fall Risks No Fall Risks    Follow up  Education provided;Falls prevention discussed Falls prevention discussed;Falls evaluation completed       Data saved with a previous flowsheet row definition    S/p L knee replacement 06/2023 Annemarie).  Known afib followed by afib clinic.   DM - continues metformin  1000mg  bid and farxiga  10mg  daily. He's been eating more popcorn and breads. Not checking sugars at home. Brings fasting sugar log 120-140s.  No fmhx MTC or MEN2.   Brings BP log - 120-150/70-90.   OSA - trouble tolerating CPAP mask. Told not Inspire candidate.   Preventative: Colon cancer screening - colonoscopy 2006 normal. Last colonoscopy woke up early. Leery of repeat colonoscopies.  Cologuard negative 04/2018, 08/2021.  Prostate cancer screening - yearly PSA.  Lung cancer screening - not eligible  Flu - [facility] yearly  COVID vaccine Pfizer x2 and booster 03/2020, bivalent 07/2021  Prevnar-13 - 02/2014, pneumovax 07/2015  Zostavax 2012  shingrix - 09/2021,  01/2022 Advanced directives - does not have set up. Would want wife to be HCPOA. Packet previously provided. Full code. Wouldn't want prolonged life support if terminal condition.  Seat belt use discussed  Sunscreen use discussed, no changing moles.  Ex smoker quit 1987  Alcohol - none Dentist - overdue  Eye exam - sees regularly for glaucoma, not on eye drops. S/p cataract surgery  Bowel - no constipation  Bladder - no incontinence  Lives with wife and children, no pets  Occupation: retired Forensic scientist  Edu: HS  Activity: no regular exercise  Diet: good water, fruits/vegetables daily, stopped sodas     Relevant past medical, surgical, family and social history reviewed and updated as indicated. Interim medical history since our last visit reviewed. Allergies and medications reviewed and updated. Outpatient Medications Prior to Visit  Medication Sig Dispense Refill   Blood Glucose Monitoring Suppl (TRUE METRIX METER) w/Device KIT Use as instructed to check blood sugar once a day 1 kit 0   cetirizine (ZYRTEC) 10 MG tablet Take 10 mg by mouth daily.     Cholecalciferol (VITAMIN D3) 25 MCG (1000 UT) CAPS Take 2 capsules (2,000 Units total) by mouth  daily.     dapagliflozin  propanediol (FARXIGA ) 10 MG TABS tablet Take 1 tablet (10 mg total) by mouth daily before breakfast. 30 tablet 11   diclofenac Sodium (VOLTAREN) 1 % GEL Apply 1 application topically 4 (four) times daily as needed (pain).     fluticasone (FLONASE) 50 MCG/ACT nasal spray Place 1 spray into both nostrils daily as needed for allergies or rhinitis.     Tuberculin-Allergy Syringes (ALLERGY SYRINGE 1CC/27GX1/2) 27G X 1/2 1 ML MISC See admin instructions.     amLODipine  (NORVASC ) 10 MG tablet Take 1 tablet by mouth once daily 90 tablet 0   apixaban  (ELIQUIS ) 5 MG TABS tablet Take 1 tablet (5 mg total) by mouth 2 (two) times daily. 60 tablet 11   atorvastatin  (LIPITOR) 40 MG tablet Take 1 tablet by mouth once daily 90 tablet 0    docusate sodium  (COLACE) 100 MG capsule Take 1 capsule (100 mg total) by mouth daily as needed. 30 capsule 2   doxycycline  (VIBRAMYCIN ) 100 MG capsule Take 1 capsule (100 mg total) by mouth 2 (two) times daily. To be taken after surgery 20 capsule 0   enalapril  (VASOTEC ) 20 MG tablet Take 1 tablet (20 mg total) by mouth daily. 90 tablet 4   metFORMIN  (GLUCOPHAGE ) 1000 MG tablet TAKE 1 TABLET BY MOUTH TWICE DAILY WITH  A  MEAL 120 tablet 0   methocarbamol  (ROBAXIN -750) 750 MG tablet Take 1 tablet (750 mg total) by mouth 2 (two) times daily as needed for muscle spasms. 20 tablet 2   metoprolol  succinate (TOPROL -XL) 50 MG 24 hr tablet TAKE 1 TABLET BY MOUTH ONCE DAILY WITH MEAL OR  IMMEDIATELY  FOLLOWING  A  MEAL 90 tablet 0   methocarbamol  (ROBAXIN -750) 750 MG tablet Take 1 tablet (750 mg total) by mouth 2 (two) times daily as needed for muscle spasms. (Patient not taking: Reported on 09/23/2023) 20 tablet 2   ondansetron  (ZOFRAN ) 4 MG tablet Take 1 tablet (4 mg total) by mouth every 8 (eight) hours as needed for nausea or vomiting. (Patient not taking: Reported on 09/23/2023) 40 tablet 0   oxyCODONE  (ROXICODONE ) 5 MG immediate release tablet Take 1-2 tablets (5-10 mg total) by mouth every 6 (six) hours as needed. To be taken after surgery (Patient not taking: Reported on 09/23/2023) 40 tablet 0   traMADol  (ULTRAM ) 50 MG tablet Take 1 tablet (50 mg total) by mouth every 12 (twelve) hours as needed. (Patient not taking: Reported on 09/23/2023) 30 tablet 1   No facility-administered medications prior to visit.     Per HPI unless specifically indicated in ROS section below Review of Systems  Constitutional:  Negative for activity change, appetite change, chills, fatigue, fever and unexpected weight change.  HENT:  Negative for hearing loss.   Eyes:  Negative for visual disturbance.  Respiratory:  Negative for cough, chest tightness, shortness of breath and wheezing.   Cardiovascular:  Negative for chest  pain, palpitations and leg swelling.  Gastrointestinal:  Negative for abdominal distention, abdominal pain, blood in stool, constipation, diarrhea, nausea and vomiting.  Genitourinary:  Negative for difficulty urinating and hematuria.  Musculoskeletal:  Negative for arthralgias, myalgias and neck pain.  Skin:  Negative for rash.  Neurological:  Negative for dizziness, seizures, syncope and headaches.  Hematological:  Negative for adenopathy. Does not bruise/bleed easily.  Psychiatric/Behavioral:  Negative for dysphoric mood. The patient is not nervous/anxious.     Objective:  BP 132/66   Pulse 84   Temp 98 F (  36.7 C) (Oral)   Ht 5' 6 (1.676 m)   Wt 230 lb 4 oz (104.4 kg)   SpO2 97%   BMI 37.16 kg/m   Wt Readings from Last 3 Encounters:  12/16/23 230 lb 4 oz (104.4 kg)  09/23/23 230 lb (104.3 kg)  06/16/23 230 lb (104.3 kg)      Physical Exam Vitals and nursing note reviewed.  Constitutional:      General: He is not in acute distress.    Appearance: Normal appearance. He is well-developed. He is not ill-appearing.  HENT:     Head: Normocephalic and atraumatic.     Right Ear: Hearing, tympanic membrane, ear canal and external ear normal.     Left Ear: Hearing, tympanic membrane, ear canal and external ear normal.     Nose: Nose normal.     Mouth/Throat:     Mouth: Mucous membranes are moist.     Pharynx: Oropharynx is clear. No oropharyngeal exudate or posterior oropharyngeal erythema.  Eyes:     General: No scleral icterus.    Extraocular Movements: Extraocular movements intact.     Conjunctiva/sclera: Conjunctivae normal.     Pupils: Pupils are equal, round, and reactive to light.  Neck:     Thyroid: No thyroid mass or thyromegaly.     Vascular: No carotid bruit.  Cardiovascular:     Rate and Rhythm: Normal rate and regular rhythm.     Pulses: Normal pulses.          Radial pulses are 2+ on the right side and 2+ on the left side.     Heart sounds: Normal heart  sounds. No murmur heard. Pulmonary:     Effort: Pulmonary effort is normal. No respiratory distress.     Breath sounds: Normal breath sounds. No wheezing, rhonchi or rales.  Abdominal:     General: Bowel sounds are normal. There is no distension.     Palpations: Abdomen is soft. There is no mass.     Tenderness: There is no abdominal tenderness. There is no guarding or rebound.     Hernia: No hernia is present.  Musculoskeletal:        General: Normal range of motion.     Cervical back: Normal range of motion and neck supple.     Right lower leg: No edema.     Left lower leg: No edema.  Lymphadenopathy:     Cervical: No cervical adenopathy.  Skin:    General: Skin is warm and dry.     Findings: No rash.  Neurological:     General: No focal deficit present.     Mental Status: He is alert and oriented to person, place, and time.  Psychiatric:        Mood and Affect: Mood normal.        Behavior: Behavior normal.        Thought Content: Thought content normal.        Judgment: Judgment normal.       Results for orders placed or performed in visit on 12/08/23  PSA   Collection Time: 12/08/23  7:25 AM  Result Value Ref Range   PSA 1.24 0.10 - 4.00 ng/mL  CBC with Differential/Platelet   Collection Time: 12/08/23  7:25 AM  Result Value Ref Range   WBC 7.1 4.0 - 10.5 K/uL   RBC 4.85 4.22 - 5.81 Mil/uL   Hemoglobin 13.9 13.0 - 17.0 g/dL   HCT 58.0 60.9 - 47.9 %  MCV 86.4 78.0 - 100.0 fl   MCHC 33.2 30.0 - 36.0 g/dL   RDW 84.9 88.4 - 84.4 %   Platelets 222.0 150.0 - 400.0 K/uL   Neutrophils Relative % 71.2 43.0 - 77.0 %   Lymphocytes Relative 18.7 12.0 - 46.0 %   Monocytes Relative 8.1 3.0 - 12.0 %   Eosinophils Relative 1.3 0.0 - 5.0 %   Basophils Relative 0.7 0.0 - 3.0 %   Neutro Abs 5.1 1.4 - 7.7 K/uL   Lymphs Abs 1.3 0.7 - 4.0 K/uL   Monocytes Absolute 0.6 0.1 - 1.0 K/uL   Eosinophils Absolute 0.1 0.0 - 0.7 K/uL   Basophils Absolute 0.0 0.0 - 0.1 K/uL  VITAMIN D  25  Hydroxy (Vit-D Deficiency, Fractures)   Collection Time: 12/08/23  7:25 AM  Result Value Ref Range   VITD 43.72 30.00 - 100.00 ng/mL  Comprehensive metabolic panel with GFR   Collection Time: 12/08/23  7:25 AM  Result Value Ref Range   Sodium 139 135 - 145 mEq/L   Potassium 4.2 3.5 - 5.1 mEq/L   Chloride 102 96 - 112 mEq/L   CO2 30 19 - 32 mEq/L   Glucose, Bld 163 (H) 70 - 99 mg/dL   BUN 21 6 - 23 mg/dL   Creatinine, Ser 9.07 0.40 - 1.50 mg/dL   Total Bilirubin 0.6 0.2 - 1.2 mg/dL   Alkaline Phosphatase 60 39 - 117 U/L   AST 12 0 - 37 U/L   ALT 14 0 - 53 U/L   Total Protein 6.4 6.0 - 8.3 g/dL   Albumin 4.2 3.5 - 5.2 g/dL   GFR 18.28 >39.99 mL/min   Calcium  10.3 8.4 - 10.5 mg/dL  Lipid panel   Collection Time: 12/08/23  7:25 AM  Result Value Ref Range   Cholesterol 119 0 - 200 mg/dL   Triglycerides 09.9 0.0 - 149.0 mg/dL   HDL 64.99 (L) >60.99 mg/dL   VLDL 81.9 0.0 - 59.9 mg/dL   LDL Cholesterol 66 0 - 99 mg/dL   Total CHOL/HDL Ratio 3    NonHDL 83.59   Microalbumin / creatinine urine ratio   Collection Time: 12/08/23  7:25 AM  Result Value Ref Range   Microalb, Ur 59.5 (H) 0.0 - 1.9 mg/dL   Creatinine,U 24.5 mg/dL   Microalb Creat Ratio 789.3 (H) 0.0 - 30.0 mg/g  Hemoglobin A1c   Collection Time: 12/08/23  7:25 AM  Result Value Ref Range   Hgb A1c MFr Bld 7.0 (H) 4.6 - 6.5 %    Assessment & Plan:   Problem List Items Addressed This Visit     Encounter for general adult medical examination with abnormal findings - Primary (Chronic)   Preventative protocols reviewed and updated unless pt declined. Discussed healthy diet and lifestyle.       Advanced care planning/counseling discussion (Chronic)   Advanced directives - does not have set up. Would want wife to be HCPOA. Packet previously provided. Full code. Wouldn't want prolonged life support if terminal condition.       Hypertension   Chronic, stable. Continue current regimen.       Relevant Medications    amLODipine  (NORVASC ) 10 MG tablet   atorvastatin  (LIPITOR) 40 MG tablet   enalapril  (VASOTEC ) 20 MG tablet   metoprolol  succinate (TOPROL -XL) 50 MG 24 hr tablet   Hyperlipidemia associated with type 2 diabetes mellitus (HCC)   Chronic, stable on atorvastatin  40mg  daily - continue. The ASCVD Risk score (Arnett DK,  et al., 2019) failed to calculate for the following reasons:   The valid total cholesterol range is 130 to 320 mg/dL       Relevant Medications   amLODipine  (NORVASC ) 10 MG tablet   atorvastatin  (LIPITOR) 40 MG tablet   enalapril  (VASOTEC ) 20 MG tablet   metFORMIN  (GLUCOPHAGE ) 1000 MG tablet   metoprolol  succinate (TOPROL -XL) 50 MG 24 hr tablet   Dulaglutide  (TRULICITY ) 0.75 MG/0.5ML SOAJ   Dulaglutide  (TRULICITY ) 1.5 MG/0.5ML SOAJ (Start on 01/13/2024)   Severe obesity (BMI 35.0-39.9) with comorbidity (HCC)   Start GLP1RA - see below.  Obesity complicated by comorbidities of hypertension, diabetes, hyperlipidemia, osteoarthritis, sleep apnea.      Relevant Medications   metFORMIN  (GLUCOPHAGE ) 1000 MG tablet   Dulaglutide  (TRULICITY ) 0.75 MG/0.5ML SOAJ   Dulaglutide  (TRULICITY ) 1.5 MG/0.5ML SOAJ (Start on 01/13/2024)   Type 2 diabetes mellitus with persistent proteinuria (HCC)   Chronic, A1c remains controlled however increased with worse glycemic control based on log of fasting sugars they bring.  Recommend GLP1RA for weight loss benefit, heart and kidney protection benefit. Reviewed mechanism of action of medication as well as side effects and adverse events to watch for including nausea, diarrhea, constipation, pancreatitis. No fmhx medullary thyroid cancer or MEN2. Discussed titration schedule for medication. Will start trulicity  0.75mg  weekly x 1 month then increase to 1.5mg  weekly.  Reviewed need to closely monitor sugars and possible need to drop metformin  dose if sugars trending down.  RTC 3 mo DM f/u visit  See below re proteinuria.      Relevant Medications    atorvastatin  (LIPITOR) 40 MG tablet   enalapril  (VASOTEC ) 20 MG tablet   metFORMIN  (GLUCOPHAGE ) 1000 MG tablet   Dulaglutide  (TRULICITY ) 0.75 MG/0.5ML SOAJ   Dulaglutide  (TRULICITY ) 1.5 MG/0.5ML SOAJ (Start on 01/13/2024)   Other Relevant Orders   Urinalysis, Routine w reflex microscopic   Microalbumin / creatinine urine ratio   Vitamin D  deficiency   Well controlled on vit D replacement 2000 units daily.       Obstructive sleep apnea hypopnea, severe   Chronic, severe, followed by pulmonology, intolerant to several CPAP masks tried, told not candidate for Inspire likely due to elevated BMI.  Will work towards weight loss as per below.       Permanent atrial fibrillation (HCC)   Chronic, stable period on eliquis  and toprol  XL followed by afib clinic.       Relevant Medications   amLODipine  (NORVASC ) 10 MG tablet   atorvastatin  (LIPITOR) 40 MG tablet   enalapril  (VASOTEC ) 20 MG tablet   metoprolol  succinate (TOPROL -XL) 50 MG 24 hr tablet   Hypercoagulable state due to permanent atrial fibrillation (HCC)   Continue eliquis .      Relevant Medications   amLODipine  (NORVASC ) 10 MG tablet   atorvastatin  (LIPITOR) 40 MG tablet   enalapril  (VASOTEC ) 20 MG tablet   metoprolol  succinate (TOPROL -XL) 50 MG 24 hr tablet   Diabetic nephropathy with proteinuria (HCC)   Markedly elevated microalb/cr ratio to 789.  Reassuring Cr with GFR 82.  Repeat today along with UA r/o active sediment. If persistently elevated, low threshold to check 24 hour urine protein levels as well as renal US .  Already on ACEI, SGLT2i.      Relevant Medications   atorvastatin  (LIPITOR) 40 MG tablet   enalapril  (VASOTEC ) 20 MG tablet   metFORMIN  (GLUCOPHAGE ) 1000 MG tablet   Dulaglutide  (TRULICITY ) 0.75 MG/0.5ML SOAJ   Dulaglutide  (TRULICITY ) 1.5 MG/0.5ML SOAJ (Start on 01/13/2024)  Meds ordered this encounter  Medications   amLODipine  (NORVASC ) 10 MG tablet    Sig: Take 1 tablet (10 mg total) by mouth  daily.    Dispense:  90 tablet    Refill:  3   atorvastatin  (LIPITOR) 40 MG tablet    Sig: Take 1 tablet (40 mg total) by mouth daily.    Dispense:  90 tablet    Refill:  3   enalapril  (VASOTEC ) 20 MG tablet    Sig: Take 1 tablet (20 mg total) by mouth daily.    Dispense:  90 tablet    Refill:  3   metFORMIN  (GLUCOPHAGE ) 1000 MG tablet    Sig: Take 1 tablet (1,000 mg total) by mouth 2 (two) times daily with a meal.    Dispense:  180 tablet    Refill:  3   metoprolol  succinate (TOPROL -XL) 50 MG 24 hr tablet    Sig: TAKE 1 TABLET BY MOUTH ONCE DAILY WITH MEAL OR  IMMEDIATELY  FOLLOWING  A  MEAL    Dispense:  90 tablet    Refill:  3   Dulaglutide  (TRULICITY ) 0.75 MG/0.5ML SOAJ    Sig: Inject 0.75 mg into the skin once a week.    Dispense:  2 mL    Refill:  0   Dulaglutide  (TRULICITY ) 1.5 MG/0.5ML SOAJ    Sig: Inject 1.5 mg into the skin once a week.    Dispense:  2 mL    Refill:  6    Orders Placed This Encounter  Procedures   Urinalysis, Routine w reflex microscopic   Microalbumin / creatinine urine ratio    Patient Instructions  Continue metformin  and farxiga  Price out Trulicity  - start 0.75mg  weekly for 1 month then increase to 1.5mg  weekly.  Work on advanced directive, bring us  a copy when complete. Advanced directive packet provided today.  Repeat urine test today as your urine protein levels were very high when recently checked. If staying elevated I may have you do 24 hour urine collection.  Return in 3 months for diabetes follow up visit   Follow up plan: Return in about 1 year (around 12/15/2024) for annual exam, prior fasting for blood work, medicare wellness visit.  Anton Blas, MD

## 2023-12-16 NOTE — Assessment & Plan Note (Signed)
 Preventative protocols reviewed and updated unless pt declined. Discussed healthy diet and lifestyle.

## 2023-12-16 NOTE — Assessment & Plan Note (Signed)
 Continue eliquis  ?

## 2023-12-16 NOTE — Assessment & Plan Note (Addendum)
 Chronic, A1c remains controlled however increased with worse glycemic control based on log of fasting sugars they bring.  Recommend GLP1RA for weight loss benefit, heart and kidney protection benefit. Reviewed mechanism of action of medication as well as side effects and adverse events to watch for including nausea, diarrhea, constipation, pancreatitis. No fmhx medullary thyroid cancer or MEN2. Discussed titration schedule for medication. Will start trulicity  0.75mg  weekly x 1 month then increase to 1.5mg  weekly.  Reviewed need to closely monitor sugars and possible need to drop metformin  dose if sugars trending down.  RTC 3 mo DM f/u visit  See below re proteinuria.

## 2023-12-16 NOTE — Addendum Note (Signed)
 Addended by: ISADORA RAISIN on: 12/16/2023 05:07 PM   Modules accepted: Orders

## 2023-12-16 NOTE — Assessment & Plan Note (Signed)
 Chronic, stable period on eliquis  and toprol  XL followed by afib clinic.

## 2023-12-16 NOTE — Assessment & Plan Note (Signed)
 Chronic, stable on atorvastatin 40mg  daily - continue. The ASCVD Risk score (Arnett DK, et al., 2019) failed to calculate for the following reasons:   The valid total cholesterol range is 130 to 320 mg/dL

## 2023-12-16 NOTE — Assessment & Plan Note (Signed)
Advanced directives - does not have set up. Would want wife to be HCPOA. Packet previously provided. Full code. Wouldn't want prolonged life support if terminal condition.

## 2023-12-16 NOTE — Assessment & Plan Note (Addendum)
 Markedly elevated microalb/cr ratio to 789.  Reassuring Cr with GFR 82.  Repeat today along with UA r/o active sediment. If persistently elevated, low threshold to check 24 hour urine protein levels as well as renal US .  Already on ACEI, SGLT2i.

## 2023-12-18 DIAGNOSIS — J3081 Allergic rhinitis due to animal (cat) (dog) hair and dander: Secondary | ICD-10-CM | POA: Diagnosis not present

## 2023-12-18 DIAGNOSIS — J3089 Other allergic rhinitis: Secondary | ICD-10-CM | POA: Diagnosis not present

## 2023-12-18 DIAGNOSIS — J301 Allergic rhinitis due to pollen: Secondary | ICD-10-CM | POA: Diagnosis not present

## 2023-12-18 LAB — URINALYSIS, ROUTINE W REFLEX MICROSCOPIC
Bilirubin Urine: NEGATIVE
Ketones, ur: NEGATIVE
Leukocytes,Ua: NEGATIVE
Nitrite: NEGATIVE
Specific Gravity, Urine: 1.015 (ref 1.000–1.030)
Total Protein, Urine: 30 — AB
Urine Glucose: >=1000 — AB
Urobilinogen, UA: 0.2 (ref 0.0–1.0)
WBC, UA: NONE SEEN (ref 0–?)
pH: 6 (ref 5.0–8.0)

## 2023-12-18 LAB — MICROALBUMIN / CREATININE URINE RATIO
Creatinine,U: 61.3 mg/dL
Microalb Creat Ratio: 490.9 mg/g — ABNORMAL HIGH (ref 0.0–30.0)
Microalb, Ur: 30.1 mg/dL — ABNORMAL HIGH (ref 0.0–1.9)

## 2023-12-22 ENCOUNTER — Ambulatory Visit: Payer: Self-pay | Admitting: Family Medicine

## 2023-12-22 DIAGNOSIS — H524 Presbyopia: Secondary | ICD-10-CM | POA: Diagnosis not present

## 2023-12-22 DIAGNOSIS — R801 Persistent proteinuria, unspecified: Secondary | ICD-10-CM

## 2023-12-22 DIAGNOSIS — H40023 Open angle with borderline findings, high risk, bilateral: Secondary | ICD-10-CM | POA: Diagnosis not present

## 2023-12-22 DIAGNOSIS — E1121 Type 2 diabetes mellitus with diabetic nephropathy: Secondary | ICD-10-CM

## 2023-12-22 DIAGNOSIS — H401420 Capsular glaucoma with pseudoexfoliation of lens, left eye, stage unspecified: Secondary | ICD-10-CM | POA: Diagnosis not present

## 2023-12-22 DIAGNOSIS — H34231 Retinal artery branch occlusion, right eye: Secondary | ICD-10-CM | POA: Diagnosis not present

## 2023-12-22 DIAGNOSIS — E119 Type 2 diabetes mellitus without complications: Secondary | ICD-10-CM | POA: Diagnosis not present

## 2023-12-22 LAB — HM DIABETES EYE EXAM

## 2023-12-23 ENCOUNTER — Ambulatory Visit (INDEPENDENT_AMBULATORY_CARE_PROVIDER_SITE_OTHER): Payer: Self-pay

## 2023-12-23 ENCOUNTER — Other Ambulatory Visit

## 2023-12-23 ENCOUNTER — Ambulatory Visit: Admitting: Orthopaedic Surgery

## 2023-12-23 DIAGNOSIS — J3089 Other allergic rhinitis: Secondary | ICD-10-CM | POA: Diagnosis not present

## 2023-12-23 DIAGNOSIS — M1711 Unilateral primary osteoarthritis, right knee: Secondary | ICD-10-CM

## 2023-12-23 DIAGNOSIS — J3081 Allergic rhinitis due to animal (cat) (dog) hair and dander: Secondary | ICD-10-CM | POA: Diagnosis not present

## 2023-12-23 DIAGNOSIS — J301 Allergic rhinitis due to pollen: Secondary | ICD-10-CM | POA: Diagnosis not present

## 2023-12-23 NOTE — Progress Notes (Signed)
 Office Visit Note   Patient: James Robbins.           Date of Birth: 27-Jul-1950           MRN: [id number] Visit Date: 12/23/2023              Requested by: Rilla Baller, MD [address] [place],  [place] [address] PCP: Rilla Baller, MD   Assessment & Plan: Visit Diagnoses:  1. Primary osteoarthritis of right knee     Plan: History of Present Illness The patient presents with right knee pain for evaluation and consideration of right knee replacement.  He experiences pain in the right knee, describing it as 'worn out.' He applies arthritis cream to the right knee, which he previously used on the left knee. He underwent left knee replacement in January and is satisfied with the outcome, as his left knee no longer hurts. He plans to travel to the [place] in June next year and wants to ensure he is fit for travel. His diabetes is managed with Trulicity  injections after a recent increase in blood sugar levels.  Physical Exam MUSCULOSKELETAL: Tenderness on the medial aspect of the right knee. Varus deformity in right knee.  Assessment and Plan Impression is severe right knee degenerative joint disease secondary to Osteoarthritis.  Patient has attempted conservative treatment for at least 6 consecutive weeks within the past 12 weeks, including but not limited to physical therapy, home exercise program, NSAIDs, activity modification, and/or corticosteroid injections. Despite these efforts, symptoms have not improved or have worsened. Conservative measures have been deemed unsuccessful at this time. After a detailed discussion covering diagnosis and treatment options--including the risks, benefits, alternatives, and potential complications of surgical and nonsurgical management--the patient elected to proceed with surgery  Anticoagulants: No antithrombotic Postop anticoagulation: Eliquis  Diabetic: Yes  Nickel allergy: No Prior DVT/PE: No Tobacco use: No Clearances  needed for surgery: obtained within the last year Anticipated discharge dispo: Home, possible outpatient    Type 2 diabetes mellitus Recent increase in blood glucose levels despite previous control. - Continue Trulicity  injections to stabilize glucose levels.  Follow-Up Instructions: No follow-ups on file.   Orders:  Orders Placed This Encounter  Procedures   XR KNEE 3 VIEW RIGHT   No orders of the defined types were placed in this encounter.     Procedures: No procedures performed   Clinical Data: No additional findings.   Subjective: Chief Complaint  Patient presents with   Right Knee - Pain    HPI  Review of Systems  Constitutional: Negative.   HENT: Negative.    Eyes: Negative.   Respiratory: Negative.    Cardiovascular: Negative.   Gastrointestinal: Negative.   Endocrine: Negative.   Genitourinary: Negative.   Skin: Negative.   Allergic/Immunologic: Negative.   Neurological: Negative.   Hematological: Negative.   Psychiatric/Behavioral: Negative.    All other systems reviewed and are negative.    Objective: Vital Signs: There were no vitals taken for this visit.  Physical Exam Vitals and nursing note reviewed.  Constitutional:      Appearance: He is well-developed.  Pulmonary:     Effort: Pulmonary effort is normal.  Abdominal:     Palpations: Abdomen is soft.  Skin:    General: Skin is warm.  Neurological:     Mental Status: He is alert and oriented to person, place, and time.  Psychiatric:        Behavior: Behavior normal.  Thought Content: Thought content normal.        Judgment: Judgment normal.     Ortho Exam  Specialty Comments:  No specialty comments available.  Imaging: XR KNEE 3 VIEW RIGHT Result Date: 12/23/2023 X-rays demonstrate severe osteoarthritis.  Bone-on-bone joint space narrowing of medial compartment.  Varus deformity.    PMFS History: Patient Active Problem List   Diagnosis Date Noted   Diabetic  nephropathy with proteinuria (HCC) 12/16/2023   Status post total left knee replacement 06/16/2023   Hypercoagulable state due to permanent atrial fibrillation (HCC) 01/01/2023   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) 01/01/2022   Hypertensive retinopathy of both eyes 01/01/2022   Vision loss of right eye 09/06/2021   Medicare annual wellness visit, subsequent 09/05/2021   Right retinal artery branch occlusion 04/05/2021   Obstructive sleep apnea hypopnea, severe 11/07/2020   Permanent atrial fibrillation (HCC) 11/07/2020   Primary osteoarthritis of knees, bilateral 04/23/2019   Vitamin D  deficiency 09/23/2016   Advanced care planning/counseling discussion 07/20/2015   Encounter for general adult medical examination with abnormal findings 02/21/2014   Severe obesity (BMI 35.0-39.9) with comorbidity (HCC)    Type 2 diabetes mellitus with persistent proteinuria (HCC)    Seasonal allergic rhinitis 12/24/2011   Hypertension 12/24/2011   Hyperlipidemia associated with type 2 diabetes mellitus (HCC) 12/24/2011   Past Medical History:  Diagnosis Date   Atrial fibrillation (HCC)    Chronic kidney disease (CKD), stage I    proteinuria   Controlled type 2 diabetes mellitus with diabetic nephropathy (HCC)    Completed DSME 2017   Hematuria 2014   s/p normal urological workup ([place])   Hyperlipidemia    Hypertension    Obesity    Seasonal allergic rhinitis    Sleep apnea    has cpap machine; don't use    Family History  Problem Relation Age of Onset   Diabetes Mother    Hypertension Mother    Hyperlipidemia Mother    CAD Father        22v CABG, smoker   Osteoporosis Sister    Cancer Neg Hx     Past Surgical History:  Procedure Laterality Date   CARDIOVERSION N/A 11/27/2020   Procedure: CARDIOVERSION;  Surgeon: Nahser, Aleene PARAS, MD;  Location: [facility] ENDOSCOPY;  Service: Cardiovascular;  Laterality: N/A;   CATARACT EXTRACTION Bilateral 08/2021   COLONOSCOPY   06/10/2004   per pt normal ([place])   TOTAL KNEE ARTHROPLASTY Left 06/16/2023   Procedure: LEFT TOTAL KNEE ARTHROPLASTY;  Surgeon: Jerri Kay HERO, MD;  Location: [facility] OR;  Service: Orthopedics;  Laterality: Left;   VASECTOMY     VASECTOMY REVERSAL     Social History   Occupational History   Occupation: retired    Associate Professor: UNEMPLOYED    Comment: 2006  Tobacco Use   Smoking status: Former    Current packs/day: 0.00    Types: Cigarettes    Quit date: 06/09/1986    Years since quitting: 37.5   Smokeless tobacco: Never  Vaping Use   Vaping status: Never Used  Substance and Sexual Activity   Alcohol use: No    Alcohol/week: 0.0 standard drinks of alcohol   Drug use: No   Sexual activity: Not Currently

## 2023-12-25 ENCOUNTER — Other Ambulatory Visit: Payer: Self-pay

## 2023-12-25 DIAGNOSIS — E1121 Type 2 diabetes mellitus with diabetic nephropathy: Secondary | ICD-10-CM | POA: Diagnosis not present

## 2023-12-25 DIAGNOSIS — E1129 Type 2 diabetes mellitus with other diabetic kidney complication: Secondary | ICD-10-CM | POA: Diagnosis not present

## 2023-12-25 DIAGNOSIS — R801 Persistent proteinuria, unspecified: Secondary | ICD-10-CM

## 2023-12-25 DIAGNOSIS — R809 Proteinuria, unspecified: Secondary | ICD-10-CM | POA: Diagnosis not present

## 2023-12-26 LAB — UPEP/UIFE/LIGHT CHAINS/TP, 24-HR UR: Kappa/Lambda Ratio,U: 4.04 (ref 1.83–14.26)

## 2023-12-29 LAB — UPEP/UIFE/LIGHT CHAINS/TP, 24-HR UR
% BETA, Urine: 9.5
ALBUMIN, U: 75.2
ALPHA 1 URINE: 3.2
ALPHA-2-GLOBULIN, U: 5.3
Free Lambda Lt Chains,Ur: 27.16 mg/L (ref 1.17–86.46)
GAMMA GLOBULIN URINE: 6.9
Kappa/Lambda Ratio,U: 6.73 mg/L (ref 1.83–15.21)
NOTE:: 27.16 mg/L (ref 1.17–86.46)
Protein, 24H Urine: 598 mg/(24.h) — AB (ref 30–150)
Protein, Ur: 26 mg/dL

## 2023-12-29 LAB — SPECIMEN STATUS REPORT

## 2023-12-30 ENCOUNTER — Ambulatory Visit: Payer: Self-pay | Admitting: Family Medicine

## 2023-12-30 DIAGNOSIS — R801 Persistent proteinuria, unspecified: Secondary | ICD-10-CM

## 2023-12-30 DIAGNOSIS — J301 Allergic rhinitis due to pollen: Secondary | ICD-10-CM | POA: Diagnosis not present

## 2023-12-30 DIAGNOSIS — E1121 Type 2 diabetes mellitus with diabetic nephropathy: Secondary | ICD-10-CM

## 2023-12-30 DIAGNOSIS — J3081 Allergic rhinitis due to animal (cat) (dog) hair and dander: Secondary | ICD-10-CM | POA: Diagnosis not present

## 2023-12-30 DIAGNOSIS — J3089 Other allergic rhinitis: Secondary | ICD-10-CM | POA: Diagnosis not present

## 2023-12-31 DIAGNOSIS — R809 Proteinuria, unspecified: Secondary | ICD-10-CM | POA: Diagnosis not present

## 2024-01-02 NOTE — Telephone Encounter (Signed)
 Referred to nephrology in [facility]

## 2024-01-06 ENCOUNTER — Other Ambulatory Visit: Payer: Self-pay | Admitting: Family Medicine

## 2024-01-06 DIAGNOSIS — J3089 Other allergic rhinitis: Secondary | ICD-10-CM | POA: Diagnosis not present

## 2024-01-06 DIAGNOSIS — J3081 Allergic rhinitis due to animal (cat) (dog) hair and dander: Secondary | ICD-10-CM | POA: Diagnosis not present

## 2024-01-06 DIAGNOSIS — J301 Allergic rhinitis due to pollen: Secondary | ICD-10-CM | POA: Diagnosis not present

## 2024-01-15 DIAGNOSIS — J3089 Other allergic rhinitis: Secondary | ICD-10-CM | POA: Diagnosis not present

## 2024-01-15 DIAGNOSIS — J3081 Allergic rhinitis due to animal (cat) (dog) hair and dander: Secondary | ICD-10-CM | POA: Diagnosis not present

## 2024-01-15 DIAGNOSIS — J301 Allergic rhinitis due to pollen: Secondary | ICD-10-CM | POA: Diagnosis not present

## 2024-01-23 DIAGNOSIS — J3081 Allergic rhinitis due to animal (cat) (dog) hair and dander: Secondary | ICD-10-CM | POA: Diagnosis not present

## 2024-01-23 DIAGNOSIS — J301 Allergic rhinitis due to pollen: Secondary | ICD-10-CM | POA: Diagnosis not present

## 2024-01-23 DIAGNOSIS — J3089 Other allergic rhinitis: Secondary | ICD-10-CM | POA: Diagnosis not present

## 2024-01-29 DIAGNOSIS — J3089 Other allergic rhinitis: Secondary | ICD-10-CM | POA: Diagnosis not present

## 2024-01-29 DIAGNOSIS — J301 Allergic rhinitis due to pollen: Secondary | ICD-10-CM | POA: Diagnosis not present

## 2024-01-29 DIAGNOSIS — J3081 Allergic rhinitis due to animal (cat) (dog) hair and dander: Secondary | ICD-10-CM | POA: Diagnosis not present

## 2024-02-03 DIAGNOSIS — J301 Allergic rhinitis due to pollen: Secondary | ICD-10-CM | POA: Diagnosis not present

## 2024-02-03 DIAGNOSIS — J3081 Allergic rhinitis due to animal (cat) (dog) hair and dander: Secondary | ICD-10-CM | POA: Diagnosis not present

## 2024-02-03 DIAGNOSIS — J3089 Other allergic rhinitis: Secondary | ICD-10-CM | POA: Diagnosis not present

## 2024-02-04 DIAGNOSIS — I129 Hypertensive chronic kidney disease with stage 1 through stage 4 chronic kidney disease, or unspecified chronic kidney disease: Secondary | ICD-10-CM | POA: Diagnosis not present

## 2024-02-04 DIAGNOSIS — N189 Chronic kidney disease, unspecified: Secondary | ICD-10-CM | POA: Diagnosis not present

## 2024-02-04 DIAGNOSIS — R809 Proteinuria, unspecified: Secondary | ICD-10-CM | POA: Diagnosis not present

## 2024-02-04 DIAGNOSIS — E559 Vitamin D deficiency, unspecified: Secondary | ICD-10-CM | POA: Diagnosis not present

## 2024-02-05 DIAGNOSIS — R809 Proteinuria, unspecified: Secondary | ICD-10-CM | POA: Diagnosis not present

## 2024-02-06 ENCOUNTER — Telehealth: Payer: Self-pay | Admitting: Orthopaedic Surgery

## 2024-02-06 ENCOUNTER — Telehealth: Payer: Self-pay

## 2024-02-06 NOTE — Telephone Encounter (Signed)
 Called patient to offer surgery date for right total knee arthroplasty with Dr Jerri.  No answer.  Left message on both home and cell voicemail.  Provided my name and direct number for scheduling.

## 2024-02-06 NOTE — Telephone Encounter (Signed)
   Name: James Robbins.  DOB: 1950-07-01  MRN: [id number]  Primary Cardiologist: (Needs to be established. Formerly [employer])    Preoperative team, please contact this patient and set up a phone call appointment for further preoperative risk assessment. Please obtain consent and complete medication review. Thank you for your help.  I confirm that guidance regarding antiplatelet and oral anticoagulation therapy has been completed and, if necessary, noted below.  Patient is not on anticoagulation or antiplatelet per review of medical record in Epic.    I also confirmed the patient resides in the state of [place] . As per [facility] Medical Board telemedicine laws, the patient must reside in the state in which the provider is licensed.   Lamarr Satterfield, NP 02/06/2024, 4:44 PM [facility]

## 2024-02-06 NOTE — Telephone Encounter (Signed)
   Pre-operative Risk Assessment    Patient Name: James Robbins.  DOB: 19-Aug-1950 MRN: [id number]   Date of last office visit: 04/01/23 Date of next office visit: Not scheduled   Request for Surgical Clearance    Procedure:  Right total knee arthroplasty  Date of Surgery:  Clearance 04/12/24                                Surgeon:  Kay Sharper, MD Surgeon's Group or Practice Name:  [facility] Phone number:  [phone] Fax number:  [phone]   Type of Clearance Requested:   - Medical    Type of Anesthesia:  Spinal   Additional requests/questions:    Bonney Ival LOISE Gerome   02/06/2024, 4:32 PM

## 2024-02-06 NOTE — Telephone Encounter (Signed)
   Name: James Robbins.  DOB: 03-03-1951  MRN: [id number]  Primary Cardiologist: None   Preoperative team, please contact this patient and set up a phone call appointment for further preoperative risk assessment. Please obtain consent and complete medication review. Thank you for your help.  I confirm that guidance regarding antiplatelet and oral anticoagulation therapy has been completed and, if necessary, noted below.  I also confirmed the patient resides in the state of [place] . As per [facility] Medical Board telemedicine laws, the patient must reside in the state in which the provider is licensed.   Lamarr Satterfield, NP 02/06/2024, 4:43 PM [facility]

## 2024-02-07 ENCOUNTER — Other Ambulatory Visit (HOSPITAL_COMMUNITY): Payer: Self-pay | Admitting: Physician Assistant

## 2024-02-07 DIAGNOSIS — R809 Proteinuria, unspecified: Secondary | ICD-10-CM | POA: Diagnosis not present

## 2024-02-10 ENCOUNTER — Telehealth: Payer: Self-pay

## 2024-02-10 ENCOUNTER — Other Ambulatory Visit: Payer: Self-pay | Admitting: Nephrology

## 2024-02-10 DIAGNOSIS — R809 Proteinuria, unspecified: Secondary | ICD-10-CM

## 2024-02-10 LAB — LAB REPORT - SCANNED
Creatinine, POC: 67.2 mg/dL
EGFR: 67

## 2024-02-10 NOTE — Telephone Encounter (Signed)
 Patient has been scheduled for TELEVISIT

## 2024-02-10 NOTE — Telephone Encounter (Signed)
 Patient has been scheduled for televisit and consent done     Patient Consent for Virtual Visit         James Robbins. has provided verbal consent on 02/10/2024 for a virtual visit (video or telephone).   CONSENT FOR VIRTUAL VISIT FOR:  James Robbins.  By participating in this virtual visit I agree to the following:  I hereby voluntarily request, consent and authorize [facility] and its employed or contracted physicians, physician assistants, nurse practitioners or other licensed health care professionals (the Practitioner), to provide me with telemedicine health care services (the "Services) as deemed necessary by the treating Practitioner. I acknowledge and consent to receive the Services by the Practitioner via telemedicine. I understand that the telemedicine visit will involve communicating with the Practitioner through live audiovisual communication technology and the disclosure of certain medical information by electronic transmission. I acknowledge that I have been given the opportunity to request an in-person assessment or other available alternative prior to the telemedicine visit and am voluntarily participating in the telemedicine visit.  I understand that I have the right to withhold or withdraw my consent to the use of telemedicine in the course of my care at any time, without affecting my right to future care or treatment, and that the Practitioner or I may terminate the telemedicine visit at any time. I understand that I have the right to inspect all information obtained and/or recorded in the course of the telemedicine visit and may receive copies of available information for a reasonable fee.  I understand that some of the potential risks of receiving the Services via telemedicine include:  Delay or interruption in medical evaluation due to technological equipment failure or disruption; Information transmitted may not be sufficient (e.g. poor resolution of  images) to allow for appropriate medical decision making by the Practitioner; and/or  In rare instances, security protocols could fail, causing a breach of personal health information.  Furthermore, I acknowledge that it is my responsibility to provide information about my medical history, conditions and care that is complete and accurate to the best of my ability. I acknowledge that Practitioner's advice, recommendations, and/or decision may be based on factors not within their control, such as incomplete or inaccurate data provided by me or distortions of diagnostic images or specimens that may result from electronic transmissions. I understand that the practice of medicine is not an exact science and that Practitioner makes no warranties or guarantees regarding treatment outcomes. I acknowledge that a copy of this consent can be made available to me via my patient portal [facility] MyChart), or I can request a printed copy by calling the office of [facility].    I understand that my insurance will be billed for this visit.   I have read or had this consent read to me. I understand the contents of this consent, which adequately explains the benefits and risks of the Services being provided via telemedicine.  I have been provided ample opportunity to ask questions regarding this consent and the Services and have had my questions answered to my satisfaction. I give my informed consent for the services to be provided through the use of telemedicine in my medical care

## 2024-02-16 ENCOUNTER — Ambulatory Visit
Admission: RE | Admit: 2024-02-16 | Discharge: 2024-02-16 | Disposition: A | Discharge status: Home / Self Care | Source: Ambulatory Visit | Attending: Nephrology | Admitting: Nephrology

## 2024-02-16 DIAGNOSIS — R809 Proteinuria, unspecified: Secondary | ICD-10-CM

## 2024-02-16 DIAGNOSIS — N281 Cyst of kidney, acquired: Secondary | ICD-10-CM | POA: Diagnosis not present

## 2024-02-17 DIAGNOSIS — J3081 Allergic rhinitis due to animal (cat) (dog) hair and dander: Secondary | ICD-10-CM | POA: Diagnosis not present

## 2024-02-17 DIAGNOSIS — J3089 Other allergic rhinitis: Secondary | ICD-10-CM | POA: Diagnosis not present

## 2024-02-17 DIAGNOSIS — J301 Allergic rhinitis due to pollen: Secondary | ICD-10-CM | POA: Diagnosis not present

## 2024-02-27 DIAGNOSIS — J3081 Allergic rhinitis due to animal (cat) (dog) hair and dander: Secondary | ICD-10-CM | POA: Diagnosis not present

## 2024-02-27 DIAGNOSIS — J3089 Other allergic rhinitis: Secondary | ICD-10-CM | POA: Diagnosis not present

## 2024-02-27 DIAGNOSIS — J301 Allergic rhinitis due to pollen: Secondary | ICD-10-CM | POA: Diagnosis not present

## 2024-03-02 DIAGNOSIS — J3089 Other allergic rhinitis: Secondary | ICD-10-CM | POA: Diagnosis not present

## 2024-03-02 DIAGNOSIS — J301 Allergic rhinitis due to pollen: Secondary | ICD-10-CM | POA: Diagnosis not present

## 2024-03-02 DIAGNOSIS — J3081 Allergic rhinitis due to animal (cat) (dog) hair and dander: Secondary | ICD-10-CM | POA: Diagnosis not present

## 2024-03-09 DIAGNOSIS — J301 Allergic rhinitis due to pollen: Secondary | ICD-10-CM | POA: Diagnosis not present

## 2024-03-09 DIAGNOSIS — J3081 Allergic rhinitis due to animal (cat) (dog) hair and dander: Secondary | ICD-10-CM | POA: Diagnosis not present

## 2024-03-09 DIAGNOSIS — J3089 Other allergic rhinitis: Secondary | ICD-10-CM | POA: Diagnosis not present

## 2024-03-15 ENCOUNTER — Telehealth: Payer: Self-pay

## 2024-03-15 NOTE — Telephone Encounter (Signed)
 Gave pt a call, pt is coming up for reenrollment on [employer] Farxiga ,pt did not answer mail out pt portion and faxed provider portion today.

## 2024-03-16 DIAGNOSIS — J3081 Allergic rhinitis due to animal (cat) (dog) hair and dander: Secondary | ICD-10-CM | POA: Diagnosis not present

## 2024-03-16 DIAGNOSIS — J301 Allergic rhinitis due to pollen: Secondary | ICD-10-CM | POA: Diagnosis not present

## 2024-03-16 DIAGNOSIS — J3089 Other allergic rhinitis: Secondary | ICD-10-CM | POA: Diagnosis not present

## 2024-03-17 ENCOUNTER — Ambulatory Visit: Payer: Self-pay | Admitting: Family Medicine

## 2024-03-17 ENCOUNTER — Ambulatory Visit: Admitting: Family Medicine

## 2024-03-17 ENCOUNTER — Encounter: Payer: Self-pay | Admitting: Family Medicine

## 2024-03-17 VITALS — BP 116/82 | HR 93 | Temp 97.5°F | Ht 66.0 in | Wt 218.4 lb

## 2024-03-17 DIAGNOSIS — N2889 Other specified disorders of kidney and ureter: Secondary | ICD-10-CM | POA: Diagnosis not present

## 2024-03-17 DIAGNOSIS — E1129 Type 2 diabetes mellitus with other diabetic kidney complication: Secondary | ICD-10-CM | POA: Diagnosis not present

## 2024-03-17 DIAGNOSIS — M170 Bilateral primary osteoarthritis of knee: Secondary | ICD-10-CM | POA: Diagnosis not present

## 2024-03-17 DIAGNOSIS — Z7984 Long term (current) use of oral hypoglycemic drugs: Secondary | ICD-10-CM

## 2024-03-17 DIAGNOSIS — E1121 Type 2 diabetes mellitus with diabetic nephropathy: Secondary | ICD-10-CM

## 2024-03-17 DIAGNOSIS — R809 Proteinuria, unspecified: Secondary | ICD-10-CM

## 2024-03-17 DIAGNOSIS — R21 Rash and other nonspecific skin eruption: Secondary | ICD-10-CM

## 2024-03-17 DIAGNOSIS — Z7985 Long-term (current) use of injectable non-insulin antidiabetic drugs: Secondary | ICD-10-CM

## 2024-03-17 DIAGNOSIS — Z7189 Other specified counseling: Secondary | ICD-10-CM

## 2024-03-17 DIAGNOSIS — N281 Cyst of kidney, acquired: Secondary | ICD-10-CM

## 2024-03-17 DIAGNOSIS — I10 Essential (primary) hypertension: Secondary | ICD-10-CM | POA: Diagnosis not present

## 2024-03-17 LAB — POCT GLYCOSYLATED HEMOGLOBIN (HGB A1C): Hemoglobin A1C: 5.5 % (ref 4.0–5.6)

## 2024-03-17 MED ORDER — METFORMIN HCL 1000 MG PO TABS: 500.0000 mg | ORAL_TABLET | Freq: Two times a day (BID) | ORAL | 1 refills | Status: DC

## 2024-03-17 NOTE — Patient Instructions (Addendum)
 Sugars are doing great -continue trulicity  and farxiga , drop metformin  to 1000mg  1/2 tablet twice daily.   For leg rash - continue lamisil daily for 1-2 weeks or could try lotrimin twice daily for 3-4 weeks. If no better   I will order kidney MRI to further evaluate spot on right kidney.   BP is running on the low side - continue monitoring at home and let me know if consistently <110/70 to drop amlodipine  dose to 1/2 tablet as well.  Bring in BP cuff to next visit    Return in 3 months for blood pressure and diabetes recheck.

## 2024-03-17 NOTE — Progress Notes (Unsigned)
 Ph: [phone] Fax: [phone]   Patient ID: James DELENA Cleotilde Mickey., male    DOB: April 28, 1951, 73 y.o.   MRN: [id number]  This visit was conducted in person.  BP 116/82   Pulse 93   Temp (!) 97.5 F (36.4 C) (Oral)   Ht 5' 6 (1.676 m)   Wt 218 lb 6 oz (99.1 kg)   SpO2 96%   BMI 35.25 kg/m   BP Readings from Last 3 Encounters:  03/17/24 116/82  12/16/23 132/66  06/17/23 129/81   CC: 3 mo DM f/u visit  Subjective:   HPI: James A Estel Scholze. is a 73 y.o. male presenting on 03/17/2024 for Medical Management of Chronic Issues (Pt here for 3 mo DM f/u /Pt also concern with rash on front and back of R leg/Pt accompanied by wife James Robbins)   Upcoming R knee replacement by Dr Jerri scheduled for next month.   R leg rash to medial thigh - not itchy or tender. This is enlarging/spreading.  No dysuria, yeast infection to groin.   CKD stage 3 - elevated 24 hour Uprotein to 598 mg/24 hours despite ACEI, SGLT2i. UPEP protein levels otherwise WNL, no M-spike noted. Referred to [facility] nephrology, saw Dr Tobie at [facility]. Kidney US  showed 6cm simple R renal cyst, as well as 1.1cm complex R kidney lesion (rec MRI) as well as several simple cysts to L kidney, mildly trabeculated bladder wall suggesting chronic bladder outlet obstruction.  Vitamin D  was decreased to 1000 units daily.   HTN - BP elevated at home brings log: 130-160s/ 70-90s.    DM - does regularly check sugars and brings log - fasting 78-113. Compliant with antihyperglycemic regimen which includes: farxiga  10mg  daily, trulicity  1.5mg  weekly, metformin  1000mg  bid. Denies low sugars or hypoglycemic symptoms. Denies paresthesias, blurry vision. Last diabetic eye exam 12/2023. Glucometer brand: true metrix. Last foot exam: 05/2023. DSME: completed 2017. Lab Results  Component Value Date   HGBA1C 5.5 03/17/2024   Diabetic Foot Exam - Simple   No data filed    Lab Results  Component Value Date   MICROALBUR 30.1 (H) 12/17/2023      Brings advanced directive which will be updated. Verified with patient - thinks he would want to be full code, treat reversible conditions but no prolonged life support if terminal condition.      Relevant past medical, surgical, family and social history reviewed and updated as indicated. Interim medical history since our last visit reviewed. Allergies and medications reviewed and updated. Outpatient Medications Prior to Visit  Medication Sig Dispense Refill   amLODipine  (NORVASC ) 10 MG tablet Take 1 tablet (10 mg total) by mouth daily. 90 tablet 3   apixaban  (ELIQUIS ) 5 MG TABS tablet Take 1 tablet by mouth twice daily 180 tablet 3   atorvastatin  (LIPITOR) 40 MG tablet Take 1 tablet (40 mg total) by mouth daily. 90 tablet 3   Blood Glucose Monitoring Suppl (TRUE METRIX METER) w/Device KIT Use as instructed to check blood sugar once a day 1 kit 0   cetirizine (ZYRTEC) 10 MG tablet Take 10 mg by mouth daily.     Cholecalciferol (VITAMIN D3) 25 MCG (1000 UT) CAPS Take 2 capsules (2,000 Units total) by mouth daily. (Patient taking differently: Take 1 capsule by mouth daily.)     dapagliflozin  propanediol (FARXIGA ) 10 MG TABS tablet Take 1 tablet (10 mg total) by mouth daily before breakfast. 30 tablet 11   diclofenac Sodium (VOLTAREN) 1 % GEL  Apply 1 application topically 4 (four) times daily as needed (pain).     Dulaglutide  (TRULICITY ) 1.5 MG/0.5ML SOAJ Inject 1.5 mg into the skin once a week. 2 mL 6   enalapril  (VASOTEC ) 20 MG tablet Take 1 tablet (20 mg total) by mouth daily. 90 tablet 3   fluticasone (FLONASE) 50 MCG/ACT nasal spray Place 1 spray into both nostrils daily as needed for allergies or rhinitis.     metoprolol  succinate (TOPROL -XL) 50 MG 24 hr tablet TAKE 1 TABLET BY MOUTH ONCE DAILY WITH MEAL OR  IMMEDIATELY  FOLLOWING  A  MEAL 90 tablet 3   Tuberculin-Allergy Syringes (ALLERGY SYRINGE 1CC/27GX1/2) 27G X 1/2 1 ML MISC See admin instructions.     Dulaglutide  (TRULICITY ) 0.75  MG/0.5ML SOAJ Inject 0.75 mg into the skin once a week. 2 mL 0   metFORMIN  (GLUCOPHAGE ) 1000 MG tablet Take 1 tablet (1,000 mg total) by mouth 2 (two) times daily with a meal. 180 tablet 3   No facility-administered medications prior to visit.     Per HPI unless specifically indicated in ROS section below Review of Systems  Objective:  BP 116/82   Pulse 93   Temp (!) 97.5 F (36.4 C) (Oral)   Ht 5' 6 (1.676 m)   Wt 218 lb 6 oz (99.1 kg)   SpO2 96%   BMI 35.25 kg/m   Wt Readings from Last 3 Encounters:  03/17/24 218 lb 6 oz (99.1 kg)  12/16/23 230 lb 4 oz (104.4 kg)  09/23/23 230 lb (104.3 kg)      Physical Exam Vitals and nursing note reviewed.  Constitutional:      Appearance: Normal appearance. He is not ill-appearing.  HENT:     Head: Normocephalic and atraumatic.     Mouth/Throat:     Mouth: Mucous membranes are moist.     Pharynx: Oropharynx is clear. No oropharyngeal exudate or posterior oropharyngeal erythema.  Eyes:     Extraocular Movements: Extraocular movements intact.     Conjunctiva/sclera: Conjunctivae normal.     Pupils: Pupils are equal, round, and reactive to light.  Cardiovascular:     Rate and Rhythm: Normal rate and regular rhythm.     Pulses: Normal pulses.     Heart sounds: Normal heart sounds. No murmur heard. Pulmonary:     Effort: Pulmonary effort is normal. No respiratory distress.     Breath sounds: Normal breath sounds. No wheezing, rhonchi or rales.  Musculoskeletal:     Right lower leg: No edema.     Left lower leg: No edema.     Comments: See HPI for foot exam if done  Skin:    General: Skin is warm and dry.     Findings: No rash.  Neurological:     Mental Status: He is alert.  Psychiatric:        Mood and Affect: Mood normal.        Behavior: Behavior normal.            Results for orders placed or performed in visit on 03/17/24  POCT glycosylated hemoglobin (Hb A1C)   Collection Time: 03/17/24  3:46 PM  Result Value  Ref Range   Hemoglobin A1C 5.5 4.0 - 5.6 %   HbA1c POC (<> result, manual entry)     HbA1c, POC (prediabetic range)     HbA1c, POC (controlled diabetic range)      Assessment & Plan:   Problem List Items Addressed This Visit  Type 2 diabetes mellitus with persistent proteinuria (HCC) - Primary   Relevant Medications   metFORMIN  (GLUCOPHAGE ) 1000 MG tablet   Other Relevant Orders   POCT glycosylated hemoglobin (Hb A1C) (Completed)     Meds ordered this encounter  Medications   metFORMIN  (GLUCOPHAGE ) 1000 MG tablet    Sig: Take 0.5 tablets (500 mg total) by mouth 2 (two) times daily with a meal.    Dispense:  90 tablet    Refill:  1    Note new dose    Orders Placed This Encounter  Procedures   POCT glycosylated hemoglobin (Hb A1C)    Patient Instructions  Sugars are doing great -continue trulicity  and farxiga , drop metformin  to 1000mg  1/2 tablet twice daily.   For leg rash - continue lamisil daily for 1-2 weeks or could try lotrimin twice daily for 3-4 weeks. If no better   I will order kidney MRI to further evaluate spot on right kidney.   BP is running on the low side - continue monitoring at home and let me know if consistently <110/70 to drop amlodipine  dose to 1/2 tablet as well.  Bring in BP cuff to next visit    Return in 3 months for blood pressure and diabetes recheck.   Follow up plan: Return in about 3 months (around 06/17/2024) for follow up visit.  Anton Blas, MD

## 2024-03-19 DIAGNOSIS — R21 Rash and other nonspecific skin eruption: Secondary | ICD-10-CM | POA: Insufficient documentation

## 2024-03-19 DIAGNOSIS — N2889 Other specified disorders of kidney and ureter: Secondary | ICD-10-CM | POA: Insufficient documentation

## 2024-03-19 DIAGNOSIS — N281 Cyst of kidney, acquired: Secondary | ICD-10-CM | POA: Insufficient documentation

## 2024-03-19 MED ORDER — METFORMIN HCL 500 MG PO TABS: 500.0000 mg | ORAL_TABLET | Freq: Two times a day (BID) | ORAL | 1 refills | Status: AC

## 2024-03-19 NOTE — Assessment & Plan Note (Signed)
 Recent renal US  showing 1.1cm complex exophytic hypoechoic lesion at right kidney - rec MRI with contrast for further evaluation. Discussed with patient, will order.

## 2024-03-19 NOTE — Assessment & Plan Note (Signed)
 Chronic, marked improvement with addition of trulicity  - will drop metformin  to 500mg  bid.  Continue farxiga  and trulicity  1.5mg  weekly.  RTC 3 mo DM f/u visit .

## 2024-03-19 NOTE — Assessment & Plan Note (Signed)
 Chronic, great control on current regimen - continue.  He notes home readings run high - suggested buying new cuff vs bringing home cuff to next visit to ensure he's getting accurate readings.

## 2024-03-19 NOTE — Assessment & Plan Note (Signed)
 S/p L knee replacement, has upcoming R total knee replacement by Dr Jerri

## 2024-03-19 NOTE — Assessment & Plan Note (Addendum)
 Predominantly to right thigh, posterior leg.  Suspicious for tinea corporis however not responding to topical antifungal, currently treating with lamisil per renal recommendation. Will switch to OTC clotrimazole, update if not improving with this to consider oral antifungal vs derm eval.  Ddx includes pityriases rosea, erythema annulare centrifugum, erythema multiforme.

## 2024-03-19 NOTE — Assessment & Plan Note (Addendum)
 Continue farxiga  and enalapril .  Established with nephrology - appreciate Dr Gordy Boring care.

## 2024-03-19 NOTE — Assessment & Plan Note (Signed)
 Discussed recent imaging.

## 2024-03-19 NOTE — Assessment & Plan Note (Addendum)
 Advanced directive - scanned 03/2024. No HCPOA designated. Wants living will followed. He thinks he would want to be full code and treat reversible conditions, but no prolonged life support if terminal condition.

## 2024-03-19 NOTE — Assessment & Plan Note (Signed)
 Congratulated on weight loss  to date  Continue healthy diet and lifestyle choices, continue trulicity  1.5mg  weekly

## 2024-03-23 DIAGNOSIS — J301 Allergic rhinitis due to pollen: Secondary | ICD-10-CM | POA: Diagnosis not present

## 2024-03-23 DIAGNOSIS — J3089 Other allergic rhinitis: Secondary | ICD-10-CM | POA: Diagnosis not present

## 2024-03-23 DIAGNOSIS — J3081 Allergic rhinitis due to animal (cat) (dog) hair and dander: Secondary | ICD-10-CM | POA: Diagnosis not present

## 2024-03-29 ENCOUNTER — Other Ambulatory Visit (HOSPITAL_COMMUNITY): Payer: Self-pay

## 2024-03-29 NOTE — Telephone Encounter (Signed)
 Received pt portion [employer]ME  Farxiga  faxed to [employer] along provider portion today.

## 2024-03-30 DIAGNOSIS — J301 Allergic rhinitis due to pollen: Secondary | ICD-10-CM | POA: Diagnosis not present

## 2024-03-30 DIAGNOSIS — J3089 Other allergic rhinitis: Secondary | ICD-10-CM | POA: Diagnosis not present

## 2024-03-30 DIAGNOSIS — J3081 Allergic rhinitis due to animal (cat) (dog) hair and dander: Secondary | ICD-10-CM | POA: Diagnosis not present

## 2024-03-31 ENCOUNTER — Telehealth: Payer: Self-pay | Admitting: Family Medicine

## 2024-03-31 NOTE — Telephone Encounter (Unsigned)
 Copied from CRM [id number]. Topic: Clinical - Medication Refill >> Mar 31, 2024  9:11 AM Mesmerise C wrote: Medication: Blood Glucose Monitoring Suppl (TRUE METRIX METER) w/Device KIT Needs the strips Has the patient contacted their pharmacy? Yes (Agent: If no, request that the patient contact the pharmacy for the refill. If patient does not wish to contact the pharmacy document the reason why and proceed with request.) (Agent: If yes, when and what did the pharmacy advise?) Switching pharmacies needs new prescription This is the patient's preferred pharmacy:  [facility] [address], [place] - [address] [place] [address] Phone: [phone] Fax: [phone]  Is this the correct pharmacy for this prescription? Yes If no, delete pharmacy and type the correct one.   Has the prescription been filled recently? No  Is the patient out of the medication? Yes  Has the patient been seen for an appointment in the last year OR does the patient have an upcoming appointment? Yes  Can we respond through MyChart? Yes  Agent: Please be advised that Rx refills may take up to 3 business days. We ask that you follow-up with your pharmacy.

## 2024-04-01 MED ORDER — TRUE METRIX BLOOD GLUCOSE TEST VI STRP: ORAL_STRIP | 12 refills | Status: AC

## 2024-04-01 NOTE — Telephone Encounter (Signed)
 Order had been sent

## 2024-04-02 ENCOUNTER — Other Ambulatory Visit: Payer: Self-pay | Admitting: Physician Assistant

## 2024-04-02 MED ORDER — DOXYCYCLINE HYCLATE 100 MG PO CAPS: 100.0000 mg | ORAL_CAPSULE | Freq: Two times a day (BID) | ORAL | 0 refills | Status: DC

## 2024-04-02 MED ORDER — METHOCARBAMOL 750 MG PO TABS: 750.0000 mg | ORAL_TABLET | Freq: Three times a day (TID) | ORAL | 2 refills | Status: DC | PRN

## 2024-04-02 MED ORDER — DOCUSATE SODIUM 100 MG PO CAPS: 100.0000 mg | ORAL_CAPSULE | Freq: Every day | ORAL | 2 refills | Status: DC | PRN

## 2024-04-02 MED ORDER — OXYCODONE-ACETAMINOPHEN 5-325 MG PO TABS: 1.0000 | ORAL_TABLET | Freq: Three times a day (TID) | ORAL | 0 refills | Status: DC | PRN

## 2024-04-02 MED ORDER — ONDANSETRON HCL 4 MG PO TABS: 4.0000 mg | ORAL_TABLET | Freq: Three times a day (TID) | ORAL | 0 refills | Status: DC | PRN

## 2024-04-05 ENCOUNTER — Ambulatory Visit

## 2024-04-05 NOTE — Telephone Encounter (Signed)
 Patient was scheduled for televisit today.  Unfortunately he has not been seen in over 1 year and will require an in office visit.  Televisit canceled today.  Also does not seem Eliquis  was addressed during clearance request.  Will route to Pharm.D. for recommendations on Eliquis  hold.  Preop: Patient will require an office visit for preoperative cardiac clearance  Pharm: Will route to Pharm.D. for recommendations on Eliquis  hold.

## 2024-04-05 NOTE — Telephone Encounter (Signed)
 I called the pt but he asked me to call back because he said he had a bad connection. I called back x 3 and got his vm. Left vm x 3.   I then called his wife and explained what we were needing to do. She is upset that this was not handled correctly in the beginning. She asked me what changed. I stated that he has not been seen > 47yr. She said she told the CMA that when they made the tele preop appt, however states the CMA said no it was just a tele appt needed per the preop APP Lamarr CROME. DNP.    I apologized to the pt's wife for the error on our team and the inconvenience this has caused for her and her husband (the pt). She did schedule appt 04/07/27 Aline Door, PAC 9:15. Pt's wife has been given address to [employer].
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
## 2024-04-05 NOTE — Telephone Encounter (Signed)
 Patient with diagnosis of PAF on Eliquis  for anticoagulation.    Procedure:  Right total knee arthroplasty  Date of procedure: 04/12/24    CHA2DS2-VASc Score = 4   This indicates a 4.8% annual risk of stroke. The patient's score is based upon: CHF History: 0 HTN History: 1 Diabetes History: 1 Stroke History: 0 Vascular Disease History: 0 Age Score: 2 Gender Score: 0     CrCl 62 mL/min ( Adj wt Sr Cr 1.14 02/04/2024)  Platelet count 222 K  Patient has not  had an Afib/aflutter ablation in the last 3 months, DCCV within the last 4 weeks or a watchman implanted in the last 45 days    Per office protocol, patient can hold Eliquis  for 3 days prior to procedure.   Patient will not need bridging with Lovenox (enoxaparin) around procedure.  **This guidance is not considered finalized until pre-operative APP has relayed final recommendations.**

## 2024-04-06 ENCOUNTER — Encounter: Payer: Self-pay | Admitting: Student

## 2024-04-06 ENCOUNTER — Ambulatory Visit: Attending: Student | Admitting: Student

## 2024-04-06 VITALS — BP 134/82 | HR 79 | Resp 16 | Ht 66.0 in | Wt 221.8 lb

## 2024-04-06 DIAGNOSIS — Z01818 Encounter for other preprocedural examination: Secondary | ICD-10-CM

## 2024-04-06 DIAGNOSIS — I10 Essential (primary) hypertension: Secondary | ICD-10-CM

## 2024-04-06 DIAGNOSIS — I4821 Permanent atrial fibrillation: Secondary | ICD-10-CM | POA: Diagnosis not present

## 2024-04-06 DIAGNOSIS — E669 Obesity, unspecified: Secondary | ICD-10-CM

## 2024-04-06 DIAGNOSIS — E119 Type 2 diabetes mellitus without complications: Secondary | ICD-10-CM | POA: Diagnosis not present

## 2024-04-06 DIAGNOSIS — E785 Hyperlipidemia, unspecified: Secondary | ICD-10-CM | POA: Diagnosis not present

## 2024-04-06 NOTE — Patient Instructions (Signed)
 Thank you for choosing [facility]!     Medication Instructions:  Hold the Eliquis  3 days prior to the surgical procedure. The Thursday before the procedure should be your last dose of Eliquis .  The surgeon will tell you when to resume taking the Eliquis .  Purchase an Omron blood pressure cuff. Send a mychart message for blood pressures higher than 130/80 consistently.  *If you need a refill on your cardiac medications before your next appointment, please call your pharmacy*   Lab Work: No labs were ordered during today's visit.  If you have labs (blood work) drawn today and your tests are completely normal, you will receive your results only by: MyChart Message (if you have MyChart) OR A paper copy in the mail If you have any lab test that is abnormal or we need to change your treatment, we will call you to review the results.   Testing/Procedures: No procedures were ordered during today's visit.   Your next appointment:   1 year(s)   Provider:   Georganna Archer, MD     Follow-Up: At [facility], you and your health needs are our priority.  As part of our continuing mission to provide you with exceptional heart care, we have created designated Provider Care Teams.  These Care Teams include your primary Cardiologist (physician) and Advanced Practice Providers (APPs -  Physician Assistants and Nurse Practitioners) who all work together to provide you with the care you need, when you need it. We recommend signing up for the patient portal called MyChart.  Sign up information is provided on this After Visit Summary.  MyChart is used to connect with patients for Virtual Visits (Telemedicine).  Patients are able to view lab/test results, encounter notes, upcoming appointments, etc.  Non-urgent messages can be sent to your provider as well.   To learn more about what you can do with MyChart, go to [email or web address].

## 2024-04-06 NOTE — Pre-Procedure Instructions (Signed)
 Surgical Instructions   Your procedure is scheduled on April 12, 2024. Report to [facility] Main Entrance A at 6:15 A.M., then check in with the Admitting office. Any questions or running late day of surgery: call [phone]  Questions prior to your surgery date: call [phone], Monday-Friday, 8am-4pm. If you experience any cold or flu symptoms such as cough, fever, chills, shortness of breath, etc. between now and your scheduled surgery, please notify us  at the above number.     Remember:  Do not eat after midnight the night before your surgery  You may drink clear liquids until 5:45AM the morning of your surgery.   Clear liquids allowed are: Water, Non-Citrus Juices (without pulp), Carbonated Beverages, Clear Tea (no milk, honey, etc.), Black Coffee Only (NO MILK, CREAM OR POWDERED CREAMER of any kind), and Gatorade.   Patient Instructions  The night before surgery:  No food after midnight. ONLY clear liquids after midnight    The day of surgery (if you have diabetes): Drink ONE (1) 12 oz G2 given to you in your pre admission testing appointment by 6:15AM the morning of surgery. Drink in one sitting. Do not sip.  This drink was given to you during your hospital  pre-op appointment visit.  Nothing else to drink after completing the  12 oz bottle of G2.         If you have questions, please contact your surgeon's office.  Take these medicines the morning of surgery with A SIP OF WATER: amLODipine  (NORVASC )  atorvastatin  (LIPITOR)  metoprolol  succinate (TOPROL -XL)   May take these medicines IF NEEDED: methocarbamol  (ROBAXIN )  ondansetron  (ZOFRAN )  Follow your surgeon's instructions on stopping Eliquis  (apixaban ). If no instructions were given, please contact your surgeon's office.   One week prior to surgery, STOP taking any Aspirin (unless otherwise instructed by your surgeon) Aleve, Naproxen, Ibuprofen, Motrin, Advil, Goody's, BC's, all herbal medications, fish  oil, and non-prescription vitamins, including diclofenac Sodium (VOLTAREN) 1 % GEL           WHAT DO I DO ABOUT MY DIABETES MEDICATION?   Do not take oral diabetes medicines (pills) the morning of surgery. DO NOT TAKE metFORMIN  (GLUCOPHAGE ) the day of surgery.   DO NOT take dapagliflozin  propanediol (FARXIGA ) for 72 hours prior to surgery. Do not take after 06/08/24  DO NOT take Dulaglutide  (TRULICITY ) for 7 days prior to surgery. Do not take after 04/04/24.   HOW TO MANAGE YOUR DIABETES BEFORE AND AFTER SURGERY  Why is it important to control my blood sugar before and after surgery? Improving blood sugar levels before and after surgery helps healing and can limit problems. A way of improving blood sugar control is eating a healthy diet by:  Eating less sugar and carbohydrates  Increasing activity/exercise  Talking with your doctor about reaching your blood sugar goals High blood sugars (greater than 180 mg/dL) can raise your risk of infections and slow your recovery, so you will need to focus on controlling your diabetes during the weeks before surgery. Make sure that the doctor who takes care of your diabetes knows about your planned surgery including the date and location.  How do I manage my blood sugar before surgery? Check your blood sugar at least 4 times a day, starting 2 days before surgery, to make sure that the level is not too high or low.  Check your blood sugar the morning of your surgery when you wake up and every 2 hours until you get to the  Short Stay unit.  If your blood sugar is less than 70 mg/dL, you will need to treat for low blood sugar: Do not take insulin . Treat a low blood sugar (less than 70 mg/dL) with  cup of clear juice (cranberry or apple), 4 glucose tablets, OR glucose gel. Recheck blood sugar in 15 minutes after treatment (to make sure it is greater than 70 mg/dL). If your blood sugar is not greater than 70 mg/dL on recheck, call [phone] for  further instructions. Report your blood sugar to the short stay nurse when you get to Short Stay.  If you are admitted to the hospital after surgery: Your blood sugar will be checked by the staff and you will probably be given insulin  after surgery (instead of oral diabetes medicines) to make sure you have good blood sugar levels. The goal for blood sugar control after surgery is 80-180 mg/dL.             Do NOT Smoke (Tobacco/Vaping) for 24 hours prior to your procedure.  If you use a CPAP at night, you may bring your mask/headgear for your overnight stay.   You will be asked to remove any contacts, glasses, piercing's, hearing aid's, dentures/partials prior to surgery. Please bring cases for these items if needed.    Patients discharged the day of surgery will not be allowed to drive home, and someone needs to stay with them for 24 hours.  SURGICAL WAITING ROOM VISITATION Patients may have no more than 2 support people in the waiting area - these visitors may rotate.   Pre-op nurse will coordinate an appropriate time for 1 ADULT support person, who may not rotate, to accompany patient in pre-op.  Children under the age of 52 must have an adult with them who is not the patient and must remain in the main waiting area with an adult.  If the patient needs to stay at the hospital during part of their recovery, the visitor guidelines for inpatient rooms apply.  Please refer to the [facility] website for the visitor guidelines for any additional information.   If you received a COVID test during your pre-op visit  it is requested that you wear a mask when out in public, stay away from anyone that may not be feeling well and notify your surgeon if you develop symptoms. If you have been in contact with anyone that has tested positive in the last 10 days please notify you surgeon.      Pre-operative 4 CHG Bathing Instructions   You can play a key role in reducing the risk of infection  after surgery. Your skin needs to be as free of germs as possible. You can reduce the number of germs on your skin by washing with CHG (chlorhexidine  gluconate) soap before surgery. CHG is an antiseptic soap that kills germs and continues to kill germs even after washing.   DO NOT use if you have an allergy to chlorhexidine /CHG or antibacterial soaps. If your skin becomes reddened or irritated, stop using the CHG and notify one of our RNs at [phone].   Please shower with the CHG soap starting 4 days before surgery using the following schedule:     Please keep in mind the following:  DO NOT shave, including legs and underarms, starting the day of your first shower.   You may shave your face at any point before/day of surgery.  Place clean sheets on your bed the day you start using CHG soap. Use a  clean washcloth (not used since being washed) for each shower. DO NOT sleep with pets once you start using the CHG.   CHG Shower Instructions:  Wash your face and private area with normal soap. If you choose to wash your hair, wash first with your normal shampoo.  After you use shampoo/soap, rinse your hair and body thoroughly to remove shampoo/soap residue.  Turn the water OFF and apply  bottle of CHG soap to a CLEAN washcloth.  Apply CHG soap ONLY FROM YOUR NECK DOWN TO YOUR TOES (washing for 3-5 minutes)  DO NOT use CHG soap on face, private areas, open wounds, or sores.  Pay special attention to the area where your surgery is being performed.  If you are having back surgery, having someone wash your back for you may be helpful. Wait 2 minutes after CHG soap is applied, then you may rinse off the CHG soap.  Pat dry with a clean towel  Put on clean clothes/pajamas   If you choose to wear lotion, please use ONLY the CHG-compatible lotions that are listed below.  Additional instructions for the day of surgery:  If you choose, you may shower the morning of surgery with an antibacterial  soap.  DO NOT APPLY any lotions, deodorants, cologne, or perfumes.   Do not bring valuables to the hospital. [facility] is not responsible for any belongings/valuables. Do not wear nail polish, gel polish, artificial nails, or any other type of covering on natural nails (fingers and toes) Do not wear jewelry or makeup Put on clean/comfortable clothes.  Please brush your teeth.  Ask your nurse before applying any prescription medications to the skin.     CHG Compatible Lotions   Aveeno Moisturizing lotion  Cetaphil Moisturizing Cream  Cetaphil Moisturizing Lotion  Clairol Herbal Essence Moisturizing Lotion, Dry Skin  Clairol Herbal Essence Moisturizing Lotion, Extra Dry Skin  Clairol Herbal Essence Moisturizing Lotion, Normal Skin  Curel Age Defying Therapeutic Moisturizing Lotion with Alpha Hydroxy  Curel Extreme Care Body Lotion  Curel Soothing Hands Moisturizing Hand Lotion  Curel Therapeutic Moisturizing Cream, Fragrance-Free  Curel Therapeutic Moisturizing Lotion, Fragrance-Free  Curel Therapeutic Moisturizing Lotion, Original Formula  Eucerin Daily Replenishing Lotion  Eucerin Dry Skin Therapy Plus Alpha Hydroxy Crme  Eucerin Dry Skin Therapy Plus Alpha Hydroxy Lotion  Eucerin Original Crme  Eucerin Original Lotion  Eucerin Plus Crme Eucerin Plus Lotion  Eucerin TriLipid Replenishing Lotion  Keri Anti-Bacterial Hand Lotion  Keri Deep Conditioning Original Lotion Dry Skin Formula Softly Scented  Keri Deep Conditioning Original Lotion, Fragrance Free Sensitive Skin Formula  Keri Lotion Fast Absorbing Fragrance Free Sensitive Skin Formula  Keri Lotion Fast Absorbing Softly Scented Dry Skin Formula  Keri Original Lotion  Keri Skin Renewal Lotion Keri Silky Smooth Lotion  Keri Silky Smooth Sensitive Skin Lotion  Nivea Body Creamy Conditioning Oil  Nivea Body Extra Enriched Lotion  Nivea Body Original Lotion  Nivea Body Sheer Moisturizing Lotion Nivea Crme  Nivea Skin  Firming Lotion  NutraDerm 30 Skin Lotion  NutraDerm Skin Lotion  NutraDerm Therapeutic Skin Cream  NutraDerm Therapeutic Skin Lotion  ProShield Protective Hand Cream  Provon moisturizing lotion  Please read over the following fact sheets that you were given.

## 2024-04-07 ENCOUNTER — Encounter (HOSPITAL_COMMUNITY): Payer: Self-pay

## 2024-04-07 ENCOUNTER — Other Ambulatory Visit: Payer: Self-pay

## 2024-04-07 ENCOUNTER — Encounter (HOSPITAL_COMMUNITY)
Admission: RE | Admit: 2024-04-07 | Discharge: 2024-04-07 | Disposition: A | Discharge status: Home / Self Care | Source: Ambulatory Visit | Attending: Orthopaedic Surgery | Admitting: Orthopaedic Surgery

## 2024-04-07 VITALS — BP 145/80 | HR 79 | Temp 98.3°F | Resp 18 | Ht 66.0 in | Wt 223.2 lb

## 2024-04-07 DIAGNOSIS — M1711 Unilateral primary osteoarthritis, right knee: Secondary | ICD-10-CM | POA: Insufficient documentation

## 2024-04-07 DIAGNOSIS — N181 Chronic kidney disease, stage 1: Secondary | ICD-10-CM | POA: Insufficient documentation

## 2024-04-07 DIAGNOSIS — E669 Obesity, unspecified: Secondary | ICD-10-CM | POA: Diagnosis not present

## 2024-04-07 DIAGNOSIS — Z6836 Body mass index (BMI) 36.0-36.9, adult: Secondary | ICD-10-CM | POA: Diagnosis not present

## 2024-04-07 DIAGNOSIS — Z87891 Personal history of nicotine dependence: Secondary | ICD-10-CM | POA: Insufficient documentation

## 2024-04-07 DIAGNOSIS — I129 Hypertensive chronic kidney disease with stage 1 through stage 4 chronic kidney disease, or unspecified chronic kidney disease: Secondary | ICD-10-CM | POA: Diagnosis not present

## 2024-04-07 DIAGNOSIS — Z01812 Encounter for preprocedural laboratory examination: Secondary | ICD-10-CM | POA: Insufficient documentation

## 2024-04-07 DIAGNOSIS — G4733 Obstructive sleep apnea (adult) (pediatric): Secondary | ICD-10-CM | POA: Diagnosis not present

## 2024-04-07 DIAGNOSIS — E119 Type 2 diabetes mellitus without complications: Secondary | ICD-10-CM

## 2024-04-07 DIAGNOSIS — E785 Hyperlipidemia, unspecified: Secondary | ICD-10-CM | POA: Insufficient documentation

## 2024-04-07 DIAGNOSIS — I4891 Unspecified atrial fibrillation: Secondary | ICD-10-CM | POA: Diagnosis not present

## 2024-04-07 DIAGNOSIS — E1122 Type 2 diabetes mellitus with diabetic chronic kidney disease: Secondary | ICD-10-CM | POA: Insufficient documentation

## 2024-04-07 DIAGNOSIS — Z01818 Encounter for other preprocedural examination: Secondary | ICD-10-CM

## 2024-04-07 HISTORY — DX: Unspecified osteoarthritis, unspecified site: M19.90

## 2024-04-07 HISTORY — DX: Cardiac arrhythmia, unspecified: I49.9

## 2024-04-07 LAB — GLUCOSE, CAPILLARY: Glucose-Capillary: 83 mg/dL (ref 70–99)

## 2024-04-07 LAB — BASIC METABOLIC PANEL WITH GFR
Anion gap: 16 — ABNORMAL HIGH (ref 5–15)
BUN: 22 mg/dL (ref 8–23)
CO2: 25 mmol/L (ref 22–32)
Calcium: 9.7 mg/dL (ref 8.9–10.3)
Chloride: 99 mmol/L (ref 98–111)
Creatinine, Ser: 1.07 mg/dL (ref 0.61–1.24)
GFR, Estimated: >60 mL/min (ref >60)
Glucose, Bld: 86 mg/dL (ref 70–99)
Potassium: 4.3 mmol/L (ref 3.5–5.1)
Sodium: 140 mmol/L (ref 135–145)

## 2024-04-07 LAB — CBC
HCT: 43.5 % (ref 39.0–52.0)
Hemoglobin: 14.3 g/dL (ref 13.0–17.0)
MCH: 29.4 pg (ref 26.0–34.0)
MCHC: 32.9 g/dL (ref 30.0–36.0)
MCV: 89.3 fL (ref 80.0–100.0)
Platelets: 250 K/uL (ref 150–400)
RBC: 4.87 MIL/uL (ref 4.22–5.81)
RDW: 14 % (ref 11.5–15.5)
WBC: 8.7 K/uL (ref 4.0–10.5)
nRBC: 0 % (ref 0.0–0.2)

## 2024-04-07 LAB — SURGICAL PCR SCREEN
MRSA, PCR: NEGATIVE
Staphylococcus aureus: NEGATIVE

## 2024-04-07 NOTE — Progress Notes (Addendum)
 PCP - Dr. Anton Blas Cardiologist - Dr. Alveta (now retired) - last office visit 03/2023  PPM/ICD - Denies Device Orders - n/a Rep Notified - n/a  Chest x-ray - n/a EKG - 04/06/2024 Stress Test - Denies ECHO - 12/07/2020 Cardiac Cath - Denies  Sleep Study - +OSA, but pt unable to tolerate CPAP  Pt is DM2. He checks his blood sugar every morning. Normal fasting range is 80s. CBG at pre-op appointment 83. Last A1c 5.5 on 03/17/2024  Last dose of GLP1 agonist-  Last dose of Trulicity  10/22 GLP1 instructions: Pt instructed to not take anymore doses until after surgery  Blood Thinner Instructions: Pt instructed to hold Eliquis  three days prior to surgery. His last dose will be October 30th. Aspirin Instructions: n/a  ERAS Protcol - Clear liquids until 0545 morning of surgery PRE-SURGERY Ensure or G2- G2 given to pt with instructions  COVID TEST- n/a   Anesthesia review: Yes. Cardiac clearance (HTN and A.Fib). Per PCP note from 10/8, pt presented with a rash on the posterior right thigh. He was prescribed topical medication and at today's appointment states that it has resolved.   Patient denies shortness of breath, fever, cough and chest pain at PAT appointment. Pt denies any respiratory illness/infection in the last two months.    All instructions explained to the patient, with a verbal understanding of the material. Patient agrees to go over the instructions while at home for a better understanding. Patient also instructed to self quarantine after being tested for COVID-19. The opportunity to ask questions was provided.
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
## 2024-04-09 DIAGNOSIS — M1711 Unilateral primary osteoarthritis, right knee: Secondary | ICD-10-CM | POA: Diagnosis not present

## 2024-04-09 DIAGNOSIS — E1129 Type 2 diabetes mellitus with other diabetic kidney complication: Secondary | ICD-10-CM | POA: Diagnosis not present

## 2024-04-09 DIAGNOSIS — J3081 Allergic rhinitis due to animal (cat) (dog) hair and dander: Secondary | ICD-10-CM | POA: Diagnosis not present

## 2024-04-09 DIAGNOSIS — Z794 Long term (current) use of insulin: Secondary | ICD-10-CM | POA: Diagnosis not present

## 2024-04-09 DIAGNOSIS — J3089 Other allergic rhinitis: Secondary | ICD-10-CM | POA: Diagnosis not present

## 2024-04-09 DIAGNOSIS — J301 Allergic rhinitis due to pollen: Secondary | ICD-10-CM | POA: Diagnosis not present

## 2024-04-09 MED ORDER — TRANEXAMIC ACID 1000 MG/10ML IV SOLN
2000.0000 mg | INTRAVENOUS | Status: AC
  Filled 2024-04-09 (×2): qty 20

## 2024-04-11 ENCOUNTER — Telehealth: Payer: Self-pay | Admitting: *Deleted

## 2024-04-11 DIAGNOSIS — M1711 Unilateral primary osteoarthritis, right knee: Secondary | ICD-10-CM | POA: Clinically undetermined

## 2024-04-11 NOTE — Care Plan (Signed)
 [facility] RNCM call to patient to discuss his upcoming Right total knee arthroplasty with Dr. Jerri at [facility] on 04/12/24. He is agreeable to case management. He lives with his spouse and will be returning home with assistance from her. He has a home CPM and RW already. No other DME needed. Would like [facility] after discharge for his anticipate HHPT. Referral made after choice provided. Reviewed post op care instructions and questions answered. Will continue to follow for needs

## 2024-04-11 NOTE — Telephone Encounter (Signed)
 [facility] pre-op call completed.

## 2024-04-12 ENCOUNTER — Observation Stay (HOSPITAL_COMMUNITY)

## 2024-04-12 ENCOUNTER — Encounter: Payer: Self-pay | Admitting: Radiology

## 2024-04-12 ENCOUNTER — Encounter (HOSPITAL_COMMUNITY): Payer: Self-pay | Admitting: Orthopaedic Surgery

## 2024-04-12 ENCOUNTER — Observation Stay (HOSPITAL_COMMUNITY)
Admission: RE | Admit: 2024-04-12 | Discharge: 2024-04-13 | Disposition: A | Discharge status: Home / Self Care | Attending: Orthopaedic Surgery | Admitting: Orthopaedic Surgery

## 2024-04-12 ENCOUNTER — Telehealth (HOSPITAL_COMMUNITY): Payer: Self-pay | Admitting: Pharmacy Technician

## 2024-04-12 ENCOUNTER — Encounter (HOSPITAL_COMMUNITY)
Admission: RE | Disposition: A | Discharge status: Home / Self Care | Payer: Self-pay | Source: Home / Self Care | Attending: Orthopaedic Surgery

## 2024-04-12 ENCOUNTER — Ambulatory Visit (HOSPITAL_COMMUNITY): Payer: Self-pay | Admitting: Vascular Surgery

## 2024-04-12 ENCOUNTER — Other Ambulatory Visit: Payer: Self-pay

## 2024-04-12 ENCOUNTER — Other Ambulatory Visit (HOSPITAL_COMMUNITY): Payer: Self-pay

## 2024-04-12 ENCOUNTER — Ambulatory Visit (HOSPITAL_COMMUNITY): Payer: Self-pay

## 2024-04-12 DIAGNOSIS — M1711 Unilateral primary osteoarthritis, right knee: Secondary | ICD-10-CM

## 2024-04-12 DIAGNOSIS — I129 Hypertensive chronic kidney disease with stage 1 through stage 4 chronic kidney disease, or unspecified chronic kidney disease: Secondary | ICD-10-CM | POA: Insufficient documentation

## 2024-04-12 DIAGNOSIS — Z7984 Long term (current) use of oral hypoglycemic drugs: Secondary | ICD-10-CM | POA: Insufficient documentation

## 2024-04-12 DIAGNOSIS — I10 Essential (primary) hypertension: Secondary | ICD-10-CM | POA: Diagnosis not present

## 2024-04-12 DIAGNOSIS — Z7901 Long term (current) use of anticoagulants: Secondary | ICD-10-CM | POA: Diagnosis not present

## 2024-04-12 DIAGNOSIS — Z96651 Presence of right artificial knee joint: Secondary | ICD-10-CM | POA: Diagnosis not present

## 2024-04-12 DIAGNOSIS — E1122 Type 2 diabetes mellitus with diabetic chronic kidney disease: Secondary | ICD-10-CM | POA: Insufficient documentation

## 2024-04-12 DIAGNOSIS — G8918 Other acute postprocedural pain: Secondary | ICD-10-CM | POA: Diagnosis not present

## 2024-04-12 DIAGNOSIS — E119 Type 2 diabetes mellitus without complications: Secondary | ICD-10-CM

## 2024-04-12 DIAGNOSIS — Z79899 Other long term (current) drug therapy: Secondary | ICD-10-CM | POA: Insufficient documentation

## 2024-04-12 DIAGNOSIS — Z87891 Personal history of nicotine dependence: Secondary | ICD-10-CM | POA: Insufficient documentation

## 2024-04-12 DIAGNOSIS — Z96652 Presence of left artificial knee joint: Secondary | ICD-10-CM | POA: Diagnosis not present

## 2024-04-12 DIAGNOSIS — E1140 Type 2 diabetes mellitus with diabetic neuropathy, unspecified: Secondary | ICD-10-CM | POA: Insufficient documentation

## 2024-04-12 DIAGNOSIS — M25461 Effusion, right knee: Secondary | ICD-10-CM | POA: Diagnosis not present

## 2024-04-12 DIAGNOSIS — N181 Chronic kidney disease, stage 1: Secondary | ICD-10-CM | POA: Diagnosis not present

## 2024-04-12 HISTORY — PX: TOTAL KNEE ARTHROPLASTY: SHX125

## 2024-04-12 LAB — GLUCOSE, CAPILLARY
Glucose-Capillary: 111 mg/dL — ABNORMAL HIGH (ref 70–99)
Glucose-Capillary: 105 mg/dL — ABNORMAL HIGH (ref 70–99)
Glucose-Capillary: 143 mg/dL — ABNORMAL HIGH (ref 70–99)
Glucose-Capillary: 222 mg/dL — ABNORMAL HIGH (ref 70–99)

## 2024-04-12 SURGERY — ARTHROPLASTY, KNEE, TOTAL
Anesthesia: Regional | Site: Knee | Laterality: Right

## 2024-04-12 MED ORDER — SODIUM CHLORIDE 0.9 % IV SOLN: INTRAVENOUS | Status: DC

## 2024-04-12 MED ORDER — OXYCODONE HCL 5 MG PO TABS: 5.0000 mg | ORAL_TABLET | Freq: Once | ORAL | Status: DC | PRN

## 2024-04-12 MED ORDER — KETOROLAC TROMETHAMINE 15 MG/ML IJ SOLN
7.5000 mg | Freq: Four times a day (QID) | INTRAMUSCULAR | Status: AC
  Administered 2024-04-12 – 2024-04-13 (×4): 7.5 mg via INTRAVENOUS
  Filled 2024-04-12 (×4): qty 1

## 2024-04-12 MED ORDER — ROPIVACAINE HCL 5 MG/ML IJ SOLN
INTRAMUSCULAR | Status: DC | PRN
  Administered 2024-04-12: 20 mL via PERINEURAL

## 2024-04-12 MED ORDER — METOCLOPRAMIDE HCL 5 MG/ML IJ SOLN: 5.0000 mg | Freq: Three times a day (TID) | INTRAMUSCULAR | Status: DC | PRN

## 2024-04-12 MED ORDER — POVIDONE-IODINE 10 % EX SWAB
2.0000 | Freq: Once | CUTANEOUS | Status: AC
  Administered 2024-04-12: 2 via TOPICAL

## 2024-04-12 MED ORDER — ACETAMINOPHEN 325 MG PO TABS: 325.0000 mg | ORAL_TABLET | Freq: Four times a day (QID) | ORAL | Status: DC | PRN

## 2024-04-12 MED ORDER — APIXABAN 2.5 MG PO TABS
2.5000 mg | ORAL_TABLET | Freq: Two times a day (BID) | ORAL | Status: DC
  Administered 2024-04-13: 2.5 mg via ORAL
  Filled 2024-04-12: qty 1

## 2024-04-12 MED ORDER — ACETAMINOPHEN 10 MG/ML IV SOLN
INTRAVENOUS | Status: DC | PRN
  Administered 2024-04-12: 1000 mg via INTRAVENOUS

## 2024-04-12 MED ORDER — METHOCARBAMOL 1000 MG/10ML IJ SOLN: 500.0000 mg | Freq: Four times a day (QID) | INTRAMUSCULAR | Status: DC | PRN

## 2024-04-12 MED ORDER — PROPOFOL 10 MG/ML IV BOLUS
INTRAVENOUS | Status: DC | PRN
  Administered 2024-04-12: 20 mg via INTRAVENOUS

## 2024-04-12 MED ORDER — PROPOFOL 10 MG/ML IV BOLUS
INTRAVENOUS | Status: AC
  Filled 2024-04-12: qty 20

## 2024-04-12 MED ORDER — DROPERIDOL 2.5 MG/ML IJ SOLN: 0.6250 mg | Freq: Once | INTRAMUSCULAR | Status: DC | PRN

## 2024-04-12 MED ORDER — OXYCODONE HCL 5 MG PO TABS: 10.0000 mg | ORAL_TABLET | Freq: Four times a day (QID) | ORAL | Status: DC | PRN

## 2024-04-12 MED ORDER — LACTATED RINGERS IV SOLN: INTRAVENOUS | Status: DC | PRN

## 2024-04-12 MED ORDER — ORAL CARE MOUTH RINSE: 15.0000 mL | Freq: Once | OROMUCOSAL | Status: AC

## 2024-04-12 MED ORDER — MENTHOL 3 MG MT LOZG: 1.0000 | LOZENGE | OROMUCOSAL | Status: DC | PRN

## 2024-04-12 MED ORDER — TRANEXAMIC ACID 1000 MG/10ML IV SOLN
2000.0000 mg | INTRAVENOUS | Status: DC
  Filled 2024-04-12: qty 20

## 2024-04-12 MED ORDER — ISOPROPYL ALCOHOL 70 % SOLN
Status: DC | PRN
  Administered 2024-04-12: 1 via TOPICAL

## 2024-04-12 MED ORDER — VANCOMYCIN HCL 1000 MG IV SOLR
INTRAVENOUS | Status: AC
  Filled 2024-04-12: qty 20

## 2024-04-12 MED ORDER — HYDROMORPHONE HCL 1 MG/ML IJ SOLN: 1.0000 mg | Freq: Three times a day (TID) | INTRAMUSCULAR | Status: DC | PRN

## 2024-04-12 MED ORDER — BUPIVACAINE IN DEXTROSE 0.75-8.25 % IT SOLN
INTRATHECAL | Status: DC | PRN
  Administered 2024-04-12: 1.8 mL via INTRATHECAL

## 2024-04-12 MED ORDER — ROCURONIUM BROMIDE 10 MG/ML (PF) SYRINGE
PREFILLED_SYRINGE | INTRAVENOUS | Status: AC
  Filled 2024-04-12: qty 10

## 2024-04-12 MED ORDER — PHENYLEPHRINE HCL-NACL 20-0.9 MG/250ML-% IV SOLN
INTRAVENOUS | Status: DC | PRN
  Administered 2024-04-12: 20 ug/min via INTRAVENOUS

## 2024-04-12 MED ORDER — TRANEXAMIC ACID 1000 MG/10ML IV SOLN
INTRAVENOUS | Status: DC | PRN
  Administered 2024-04-12: 2000 mg via TOPICAL

## 2024-04-12 MED ORDER — LACTATED RINGERS IV SOLN: INTRAVENOUS | Status: DC

## 2024-04-12 MED ORDER — BUPIVACAINE-MELOXICAM ER 400-12 MG/14ML IJ SOLN
INTRAMUSCULAR | Status: DC | PRN
  Administered 2024-04-12: 400 mg

## 2024-04-12 MED ORDER — SODIUM CHLORIDE 0.9 % IR SOLN
Status: DC | PRN
  Administered 2024-04-12: 1000 mL

## 2024-04-12 MED ORDER — BUPIVACAINE-MELOXICAM ER 400-12 MG/14ML IJ SOLN
INTRAMUSCULAR | Status: AC
  Filled 2024-04-12: qty 1

## 2024-04-12 MED ORDER — TRANEXAMIC ACID-NACL 1000-0.7 MG/100ML-% IV SOLN
1000.0000 mg | INTRAVENOUS | Status: AC
  Administered 2024-04-12: 1000 mg via INTRAVENOUS
  Filled 2024-04-12: qty 100

## 2024-04-12 MED ORDER — PRONTOSAN WOUND IRRIGATION OPTIME
TOPICAL | Status: DC | PRN
  Administered 2024-04-12: 350 mL via TOPICAL

## 2024-04-12 MED ORDER — 0.9 % SODIUM CHLORIDE (POUR BTL) OPTIME
TOPICAL | Status: DC | PRN
  Administered 2024-04-12: 1000 mL

## 2024-04-12 MED ORDER — INSULIN ASPART 100 UNIT/ML IJ SOLN
0.0000 [IU] | Freq: Three times a day (TID) | INTRAMUSCULAR | Status: DC
  Administered 2024-04-12: 3 [IU] via SUBCUTANEOUS

## 2024-04-12 MED ORDER — ACETAMINOPHEN 500 MG PO TABS
1000.0000 mg | ORAL_TABLET | Freq: Four times a day (QID) | ORAL | Status: AC
  Administered 2024-04-12 – 2024-04-13 (×4): 1000 mg via ORAL
  Filled 2024-04-12 (×4): qty 2

## 2024-04-12 MED ORDER — FENTANYL CITRATE (PF) 100 MCG/2ML IJ SOLN
INTRAMUSCULAR | Status: AC
  Administered 2024-04-12: 50 ug
  Filled 2024-04-12: qty 2

## 2024-04-12 MED ORDER — ACETAMINOPHEN 10 MG/ML IV SOLN
INTRAVENOUS | Status: AC
  Filled 2024-04-12: qty 200

## 2024-04-12 MED ORDER — INSULIN ASPART 100 UNIT/ML IJ SOLN: 0.0000 [IU] | Freq: Every day | INTRAMUSCULAR | Status: DC

## 2024-04-12 MED ORDER — ONDANSETRON HCL 4 MG PO TABS: 4.0000 mg | ORAL_TABLET | Freq: Four times a day (QID) | ORAL | Status: DC | PRN

## 2024-04-12 MED ORDER — DOCUSATE SODIUM 100 MG PO CAPS
100.0000 mg | ORAL_CAPSULE | Freq: Two times a day (BID) | ORAL | Status: DC
  Administered 2024-04-12 (×2): 100 mg via ORAL
  Filled 2024-04-12 (×2): qty 1

## 2024-04-12 MED ORDER — ACETAMINOPHEN 10 MG/ML IV SOLN: 1000.0000 mg | Freq: Once | INTRAVENOUS | Status: DC | PRN

## 2024-04-12 MED ORDER — DEXAMETHASONE SOD PHOSPHATE PF 10 MG/ML IJ SOLN
INTRAMUSCULAR | Status: DC | PRN
  Administered 2024-04-12: 5 mg via INTRAVENOUS

## 2024-04-12 MED ORDER — CEFAZOLIN SODIUM-DEXTROSE 2-4 GM/100ML-% IV SOLN
2.0000 g | INTRAVENOUS | Status: AC
  Administered 2024-04-12: 2 g via INTRAVENOUS
  Filled 2024-04-12: qty 100

## 2024-04-12 MED ORDER — OXYCODONE HCL 5 MG PO TABS: 5.0000 mg | ORAL_TABLET | Freq: Four times a day (QID) | ORAL | Status: DC | PRN

## 2024-04-12 MED ORDER — METOPROLOL SUCCINATE ER 50 MG PO TB24: 50.0000 mg | ORAL_TABLET | Freq: Every day | ORAL | Status: DC

## 2024-04-12 MED ORDER — OXYCODONE HCL 5 MG/5ML PO SOLN: 5.0000 mg | Freq: Once | ORAL | Status: DC | PRN

## 2024-04-12 MED ORDER — ONDANSETRON HCL 4 MG/2ML IJ SOLN
INTRAMUSCULAR | Status: DC | PRN
  Administered 2024-04-12: 4 mg via INTRAVENOUS

## 2024-04-12 MED ORDER — ENALAPRIL MALEATE 10 MG PO TABS
20.0000 mg | ORAL_TABLET | Freq: Every day | ORAL | Status: DC
  Administered 2024-04-12: 20 mg via ORAL
  Filled 2024-04-12 (×3): qty 2

## 2024-04-12 MED ORDER — FENTANYL CITRATE (PF) 100 MCG/2ML IJ SOLN: 25.0000 ug | INTRAMUSCULAR | Status: DC | PRN

## 2024-04-12 MED ORDER — DAPAGLIFLOZIN PROPANEDIOL 10 MG PO TABS
10.0000 mg | ORAL_TABLET | Freq: Every day | ORAL | Status: DC
  Administered 2024-04-13: 10 mg via ORAL
  Filled 2024-04-12: qty 1

## 2024-04-12 MED ORDER — ONDANSETRON HCL 4 MG/2ML IJ SOLN: 4.0000 mg | Freq: Four times a day (QID) | INTRAMUSCULAR | Status: DC | PRN

## 2024-04-12 MED ORDER — CEFAZOLIN SODIUM-DEXTROSE 2-4 GM/100ML-% IV SOLN
2.0000 g | Freq: Four times a day (QID) | INTRAVENOUS | Status: AC
  Administered 2024-04-12 – 2024-04-13 (×2): 2 g via INTRAVENOUS
  Filled 2024-04-12 (×2): qty 100

## 2024-04-12 MED ORDER — METOCLOPRAMIDE HCL 5 MG PO TABS: 5.0000 mg | ORAL_TABLET | Freq: Three times a day (TID) | ORAL | Status: DC | PRN

## 2024-04-12 MED ORDER — PHENOL 1.4 % MT LIQD: 1.0000 | OROMUCOSAL | Status: DC | PRN

## 2024-04-12 MED ORDER — PROPOFOL 500 MG/50ML IV EMUL
INTRAVENOUS | Status: DC | PRN
  Administered 2024-04-12: 10 ug/kg/min via INTRAVENOUS

## 2024-04-12 MED ORDER — AMLODIPINE BESYLATE 10 MG PO TABS: 10.0000 mg | ORAL_TABLET | Freq: Every day | ORAL | Status: DC

## 2024-04-12 MED ORDER — LIDOCAINE 2% (20 MG/ML) 5 ML SYRINGE
INTRAMUSCULAR | Status: AC
  Filled 2024-04-12: qty 5

## 2024-04-12 MED ORDER — ONDANSETRON HCL 4 MG/2ML IJ SOLN
INTRAMUSCULAR | Status: AC
  Filled 2024-04-12: qty 2

## 2024-04-12 MED ORDER — CHLORHEXIDINE GLUCONATE 0.12 % MT SOLN
15.0000 mL | Freq: Once | OROMUCOSAL | Status: AC
  Administered 2024-04-12: 15 mL via OROMUCOSAL
  Filled 2024-04-12: qty 15

## 2024-04-12 MED ORDER — INSULIN ASPART 100 UNIT/ML IJ SOLN: 0.0000 [IU] | INTRAMUSCULAR | Status: DC | PRN

## 2024-04-12 MED ORDER — METHOCARBAMOL 500 MG PO TABS: 500.0000 mg | ORAL_TABLET | Freq: Four times a day (QID) | ORAL | Status: DC | PRN

## 2024-04-12 MED ORDER — FENTANYL CITRATE (PF) 100 MCG/2ML IJ SOLN
INTRAMUSCULAR | Status: DC | PRN
  Administered 2024-04-12: 50 ug via INTRAVENOUS

## 2024-04-12 MED ORDER — VANCOMYCIN HCL 1000 MG IV SOLR
INTRAVENOUS | Status: DC | PRN
  Administered 2024-04-12: 1000 mg via TOPICAL

## 2024-04-12 MED ORDER — TRANEXAMIC ACID-NACL 1000-0.7 MG/100ML-% IV SOLN
1000.0000 mg | Freq: Once | INTRAVENOUS | Status: AC
  Administered 2024-04-12: 1000 mg via INTRAVENOUS
  Filled 2024-04-12 (×2): qty 100

## 2024-04-12 MED ORDER — DEXAMETHASONE SOD PHOSPHATE PF 10 MG/ML IJ SOLN
10.0000 mg | Freq: Once | INTRAMUSCULAR | Status: AC
  Administered 2024-04-13: 10 mg via INTRAVENOUS

## 2024-04-12 SURGICAL SUPPLY — 66 items
ALCOHOL 70% 16 OZ (MISCELLANEOUS) ×1 IMPLANT
BAG COUNTER SPONGE SURGICOUNT (BAG) ×1 IMPLANT
BAG DECANTER FOR FLEXI CONT (MISCELLANEOUS) ×1 IMPLANT
BLADE SAG 18X100X1.27 (BLADE) ×1 IMPLANT
BLADE SAW SAG 90X13X1.27 (BLADE) ×1 IMPLANT
BLADE SAW SGTL 73X25 THK (BLADE) ×1 IMPLANT
BNDG COMPR ESMARK 6X3 LF (GAUZE/BANDAGES/DRESSINGS) IMPLANT
BOWL SMART MIX CTS (DISPOSABLE) IMPLANT
CATH FOLEY 2WAY SLVR 18FR 30CC (CATHETERS) IMPLANT
CLSR STERI-STRIP ANTIMIC 1/2X4 (GAUZE/BANDAGES/DRESSINGS) IMPLANT
COMPONENT PATELLA 3 PEG 35 (Joint) IMPLANT
COOLER ICEMAN CLASSIC (MISCELLANEOUS) ×1 IMPLANT
COVER SURGICAL LIGHT HANDLE (MISCELLANEOUS) ×1 IMPLANT
CUFF TOURN SGL QUICK 42 (TOURNIQUET CUFF) IMPLANT
CUFF TRNQT CYL 34X4.125X (TOURNIQUET CUFF) ×1 IMPLANT
DERMABOND ADVANCED .7 DNX12 (GAUZE/BANDAGES/DRESSINGS) ×1 IMPLANT
DRAPE EXTREMITY T 121X128X90 (DISPOSABLE) ×1 IMPLANT
DRAPE HALF SHEET 40X57 (DRAPES) ×1 IMPLANT
DRAPE INCISE IOBAN 66X45 STRL (DRAPES) ×1 IMPLANT
DRAPE POUCH INSTRU U-SHP 10X18 (DRAPES) ×1 IMPLANT
DRAPE SURG ORHT 6 SPLT 77X108 (DRAPES) IMPLANT
DRAPE U-SHAPE 47X51 STRL (DRAPES) ×2 IMPLANT
DRSG AQUACEL AG ADV 3.5X10 (GAUZE/BANDAGES/DRESSINGS) ×1 IMPLANT
DURAPREP 26ML APPLICATOR (WOUND CARE) ×3 IMPLANT
ELECT CAUTERY BLADE 6.4 (BLADE) ×1 IMPLANT
ELECT PENCIL ROCKER SW 15FT (MISCELLANEOUS) ×1 IMPLANT
ELECTRODE REM PT RTRN 9FT ADLT (ELECTROSURGICAL) ×1 IMPLANT
GLOVE BIOGEL PI IND STRL 7.5 (GLOVE) ×1 IMPLANT
GLOVE ECLIPSE 7.0 STRL STRAW (GLOVE) ×5 IMPLANT
GLOVE INDICATOR 7.0 STRL GRN (GLOVE) ×1 IMPLANT
GLOVE SURG SYN 7.5 PF PI (GLOVE) ×5 IMPLANT
GOWN STRL REUS W/ TWL LRG LVL3 (GOWN DISPOSABLE) ×2 IMPLANT
GOWN TOGA ZIPPER T7+ PEEL AWAY (MISCELLANEOUS) ×1 IMPLANT
HOOD PEEL AWAY T7 (MISCELLANEOUS) ×1 IMPLANT
INSERT TIBIA KNEE RIGHT 10 (Joint) IMPLANT
IV 0.9% NACL 1000 ML (IV SOLUTION) ×1 IMPLANT
KIT BASIN OR (CUSTOM PROCEDURE TRAY) ×1 IMPLANT
KIT TURNOVER KIT B (KITS) ×1 IMPLANT
MANIFOLD NEPTUNE II (INSTRUMENTS) ×1 IMPLANT
MARKER SKIN DUAL TIP RULER LAB (MISCELLANEOUS) ×2 IMPLANT
NDL SPNL 18GX3.5 QUINCKE PK (NEEDLE) ×1 IMPLANT
NEEDLE SPNL 18GX3.5 QUINCKE PK (NEEDLE) ×1 IMPLANT
PACK TOTAL JOINT (CUSTOM PROCEDURE TRAY) ×1 IMPLANT
PAD ARMBOARD POSITIONER FOAM (MISCELLANEOUS) ×2 IMPLANT
PAD COLD SHLDR WRAP-ON (PAD) ×1 IMPLANT
PIN DRILL HDLS TROCAR 75 4PK (PIN) IMPLANT
PROSTHESIS FEM KNEE PS STD9 RT (Joint) IMPLANT
PROSTHESIS TIB KNEE PS 0D F RT (Joint) IMPLANT
SCREW FEMALE HEX FIX 25X2.5 (ORTHOPEDIC DISPOSABLE SUPPLIES) IMPLANT
SET HNDPC FAN SPRY TIP SCT (DISPOSABLE) ×1 IMPLANT
SOLN 0.9% NACL POUR BTL 1000ML (IV SOLUTION) ×1 IMPLANT
SOLUTION PRONTOSAN WOUND 350ML (IRRIGATION / IRRIGATOR) ×1 IMPLANT
STAPLER SKIN PROX 35W (STAPLE) IMPLANT
SUCTION TUBE FRAZIER 10FR DISP (SUCTIONS) IMPLANT
SUT ETHILON 2 0 FS 18 (SUTURE) ×2 IMPLANT
SUT STRATAFIX PDS+ 0 24IN (SUTURE) ×1 IMPLANT
SUT VIC AB 0 CT1 27XBRD ANBCTR (SUTURE) ×1 IMPLANT
SUT VIC AB 1 CTX 27 (SUTURE) IMPLANT
SUT VIC AB 2-0 CT1 TAPERPNT 27 (SUTURE) ×3 IMPLANT
SYR 30ML LL (SYRINGE) ×2 IMPLANT
TAPE STRIPS DRAPE STRL (GAUZE/BANDAGES/DRESSINGS) IMPLANT
TOWEL GREEN STERILE (TOWEL DISPOSABLE) ×1 IMPLANT
TOWEL GREEN STERILE FF (TOWEL DISPOSABLE) ×1 IMPLANT
TUBE SUCT ARGYLE STRL (TUBING) ×1 IMPLANT
UNDERPAD 30X36 HEAVY ABSORB (UNDERPADS AND DIAPERS) ×1 IMPLANT
YANKAUER SUCT BULB TIP NO VENT (SUCTIONS) ×1 IMPLANT

## 2024-04-12 NOTE — Anesthesia Postprocedure Evaluation (Signed)
 Anesthesia Post Note  Patient: Rilen A Ferreras Jr.  Procedure(s) Performed: ARTHROPLASTY, KNEE, TOTAL (Right: Knee)     Patient location during evaluation: PACU Anesthesia Type: Regional and Spinal Level of consciousness: awake and alert Pain management: pain level controlled Vital Signs Assessment: post-procedure vital signs reviewed and stable Respiratory status: spontaneous breathing, nonlabored ventilation, respiratory function stable and patient connected to nasal cannula oxygen Cardiovascular status: blood pressure returned to baseline and stable Postop Assessment: no apparent nausea or vomiting Anesthetic complications: no   There were no known notable events for this encounter.  Last Vitals:  Vitals:   04/12/24 1201 04/12/24 1548  BP: (!) 146/93 132/82  Pulse: 71 74  Resp: 20 20  Temp: 36.9 C 36.7 C  SpO2: 96% 96%    Last Pain:  Vitals:   04/12/24 1735  TempSrc:   PainSc: 3                  Thom JONELLE Peoples

## 2024-04-12 NOTE — Op Note (Signed)
 Total Knee Arthroplasty Procedure Note  Preoperative diagnosis: Right knee osteoarthritis  Postoperative diagnosis:same  Operative findings: Complete loss of articular cartilage from all 3 compartments Mild flexion contracture  Operative procedure: Right total knee arthroplasty. CPT [phone]  Surgeon: N. Ozell Cummins, MD  Assist: Ronal Morna Grave, PA-C; necessary for the timely completion of procedure and due to complexity of procedure.  Anesthesia: Spinal, regional  Tourniquet time: see anesthesia record  Implants used: Zimmer persona press fit Femur: CR 9 Tibia: F Patella: 35 mm Polyethylene: 10 mm medial congruent  Indication: James Robbins. is a 73 y.o. year old male with a history of knee pain. Having failed conservative management, the patient elected to proceed with a total knee arthroplasty.  We have reviewed the risk and benefits of the surgery and they elected to proceed after voicing understanding.  Procedure:  After informed consent was obtained and understanding of the risk were voiced including but not limited to bleeding, infection, damage to surrounding structures including nerves and vessels, blood clots, leg length inequality and the failure to achieve desired results, the operative extremity was marked with verbal confirmation of the patient in the holding area.   The patient was then brought to the operating room and transported to the operating room table in the supine position.  A tourniquet was applied to the operative extremity around the upper thigh. The operative limb was then prepped and draped in the usual sterile fashion and preoperative antibiotics were administered.  A time out was performed prior to the start of surgery confirming the correct extremity, preoperative antibiotic administration, as well as team members, implants and instruments available for the case. Correct surgical site was also confirmed with preoperative radiographs. The  limb was then elevated for exsanguination and the tourniquet was inflated. A midline incision was made and a standard medial parapatellar approach was performed.  The patella was everted which showed complete loss of articular cartilage.  The patella was resected down to 15 mm and sized to a 35 mm.  A cover was placed on the patella for protection from retractors.  We then turned our attention to the femur.  The ACL was sacrificed. Start site was drilled in the femur and the intramedullary distal femoral cutting guide was placed, set at 5 degrees valgus, taking 11 mm of distal resection for the flexion contracture. The distal cut was made. Osteophytes were then removed.   Next, the proximal tibial cutting guide was placed with appropriate slope, varus/valgus alignment and depth of resection. A drop rod was attached to confirm that it was pointed to the second metatarsal.  The proximal tibial cut was made taking 4 mm off the low side. Gap blocks were then used to assess the extension gap and alignment, and appropriate soft tissue releases were performed. Attention was turned back to the femur, which was sized using the sizing guide to a size 9. Appropriate rotation of the femoral component was determined using epicondylar axis, Whiteside's line, and assessing the flexion gap under ligament tension. The appropriate size 4-in-1 cutting block was placed and cuts were made.  Posterior femoral osteophytes and uncapped bone were then removed with the curved osteotome.  Trial components were placed, and stability was checked in full extension, mid-flexion, and deep flexion.  The PCL was resected to balance the flexion space.  The patella tracked well without a lateral release. Trial components were then removed and tibial preparation performed.  The tibial trial was pointed to the medial  third of the tibial tubercle.  The tibia was sized for a size F component and prepped.  Trial components were removed.   The bony  surfaces were irrigated with a pulse lavage and then dried. Final components were placed.  The final polyethylene liner, 10 mm thick, was inserted and checked to ensure the locking mechanism had engaged appropriately.  The stability of the construct was re-evaluated throughout a range of motion and found to be acceptable.  The tourniquet was deflated and hemostasis was achieved. The wound was irrigated with normal saline.  One gram of vancomycin  powder was placed in the surgical bed.  Topical mixture of 0.25% bupivacaine  and meloxicam  was placed in the joint for postoperative pain.  Capsular closure was performed with a #1 statafix in flexion, subcutaneous fat closed with a 0 vicryl suture, then subcutaneous tissue closed with interrupted 2.0 vicryl suture. The skin was then closed with a 2.0 nylon and dermabond. A sterile dressing was applied.  The patient was awakened in the operating room and taken to recovery in stable condition. All sponge, needle, and instrument counts were correct at the end of the case.  Morna Grave was necessary for opening, closing, retracting, limb positioning and overall facilitation and completion of the surgery.  Position: supine  Complications: none.  Time Out: performed  Drains/Packing: none Estimated blood loss: minimal Returned to Recovery Room: in good condition.   Mechanical VTE (DVT) Prophylaxis: sequential compression devices, TED thigh-high  Chemical VTE (DVT) Prophylaxis: eliquis  POD 1  Fluid Replacement  Crystalloid: see anesthesia record Blood: none  FFP: none   Specimens Removed: 1 to pathology  Sponge and Instrument Count Correct? yes  PACU: portable radiograph - knee AP and Lateral  Plan/RTC: Return in 2 weeks for suture removal.  Weight Bearing/Load Lower Extremity: full   Implant Name Type Inv. Item Serial No. Manufacturer Lot No. LRB No. Used Action  PROSTHESIS FEM KNEE PS STD9 RT - [id number] Joint PROSTHESIS FEM KNEE PS STD9 RT  ZIMMER  RECON(ORTH,TRAU,BIO,SG) [id number] Right 1 Implanted  COMPONENT PATELLA 3 PEG 35 - [id number] Joint COMPONENT PATELLA 3 PEG 35  ZIMMER RECON(ORTH,TRAU,BIO,SG) [id number] Right 1 Implanted  PROSTHESIS TIB KNEE PS 0D F RT - [id number] Joint PROSTHESIS TIB KNEE PS 0D F RT  ZIMMER RECON(ORTH,TRAU,BIO,SG)  Right 1 Implanted  INSERT TIBIA KNEE RIGHT 10 - [id number] Joint INSERT TIBIA KNEE RIGHT 10  ZIMMER [employer]ON(ORTH,TRAU,BIO,SG) [id number] Right 1 Implanted    N. Ozell Cummins, MD [facility] 9:59 AM

## 2024-04-12 NOTE — Progress Notes (Signed)
 Orthopedic Tech Progress Note Patient Details:  James Robbins. 24-Jul-1950 [id number]  Ortho Devices Type of Ortho Device: Bone foam zero knee Ortho Device/Splint Location: RLE Ortho Device/Splint Interventions: Ordered, Application   Post Interventions Patient Tolerated: Well Instructions Provided: Care of device  [name] Pac 04/12/2024, 12:02 PM

## 2024-04-12 NOTE — H&P (Signed)
 PREOPERATIVE H&P  Chief Complaint: right knee osteoarthritis  HPI: James Robbins. is a 73 y.o. male who presents for surgical treatment of right knee osteoarthritis.  He denies any changes in medical history.  Past Surgical History:  Procedure Laterality Date  . CARDIOVERSION N/A 11/27/2020   Procedure: CARDIOVERSION;  Surgeon: Alveta Aleene PARAS, MD;  Location: [facility] ENDOSCOPY;  Service: Cardiovascular;  Laterality: N/A;  . CATARACT EXTRACTION Bilateral 08/2021  . COLONOSCOPY  06/10/2004   per pt normal ([place])  . TOTAL KNEE ARTHROPLASTY Left 06/16/2023   Procedure: LEFT TOTAL KNEE ARTHROPLASTY;  Surgeon: Jerri Kay HERO, MD;  Location: [facility] OR;  Service: Orthopedics;  Laterality: Left;  SABRA VASECTOMY    . VASECTOMY REVERSAL     Social History   Socioeconomic History  . Marital status: Married    Spouse name: Not on file  . Number of children: 2  . Years of education: 20  . Highest education level: Not on file  Occupational History  . Occupation: retired    Associate Professor: UNEMPLOYED    Comment: 2006  Tobacco Use  . Smoking status: Former    Current packs/day: 0.00    Types: Cigarettes    Quit date: 06/09/1986    Years since quitting: 37.8  . Smokeless tobacco: Never  Vaping Use  . Vaping status: Never Used  Substance and Sexual Activity  . Alcohol use: No    Alcohol/week: 0.0 standard drinks of alcohol  . Drug use: No  . Sexual activity: Not Currently  Other Topics Concern  . Not on file  Social History Narrative   Lives with wife and children, no pets   Occupation: retired forensic scientist   Edu: HS   Activity: no regular exercise   Diet: good water, fruits/vegetables daily, stopped sodas   Social Drivers of Corporate Investment Banker Strain: Low Risk  (09/23/2023)   Overall Financial Resource Strain (CARDIA)   . Difficulty of Paying Living Expenses: Not hard at all  Food Insecurity: No Food Insecurity (09/23/2023)   Hunger Vital Sign   . Worried About Brewing Technologist in the Last Year: Never true   . Ran Out of Food in the Last Year: Never true  Transportation Needs: No Transportation Needs (09/23/2023)   PRAPARE - Transportation   . Lack of Transportation (Medical): No   . Lack of Transportation (Non-Medical): No  Physical Activity: Insufficiently Active (09/23/2023)   Exercise Vital Sign   . Days of Exercise per Week: 3 days   . Minutes of Exercise per Session: 30 min  Stress: No Stress Concern Present (09/23/2023)   [employer] of Occupational Health - Occupational Stress Questionnaire   . Feeling of Stress : Not at all  Social Connections: Moderately Integrated (09/23/2023)   Social Connection and Isolation Panel   . Frequency of Communication with Friends and Family: Three times a week   . Frequency of Social Gatherings with Friends and Family: More than three times a week   . Attends Religious Services: More than 4 times per year   . Active Member of Clubs or Organizations: No   . Attends Banker Meetings: Never   . Marital Status: Married   Family History  Problem Relation Age of Onset  . Diabetes Mother   . Hypertension Mother   . Hyperlipidemia Mother   . CAD Father        4v CABG, smoker  . Osteoporosis Sister   . Cancer Neg  Hx    No Known Allergies Prior to Admission medications   Medication Sig Start Date End Date Taking? Authorizing Provider  amLODipine  (NORVASC ) 10 MG tablet Take 1 tablet (10 mg total) by mouth daily. 12/16/23  Yes Rilla Baller, MD  apixaban  (ELIQUIS ) 5 MG TABS tablet Take 1 tablet by mouth twice daily 02/10/24  Yes Fenton, Clint R, PA  atorvastatin  (LIPITOR) 40 MG tablet Take 1 tablet (40 mg total) by mouth daily. 12/16/23  Yes Rilla Baller, MD  cetirizine (ZYRTEC) 10 MG tablet Take 10 mg by mouth daily.   Yes [provider]  Cholecalciferol (VITAMIN D3) 25 MCG (1000 UT) CAPS Take 2 capsules (2,000 Units total) by mouth daily. Patient taking differently: Take 1,000 Units by  mouth daily. 09/05/21  Yes Rilla Baller, MD  dapagliflozin  propanediol (FARXIGA ) 10 MG TABS tablet Take 1 tablet (10 mg total) by mouth daily before breakfast. 12/08/23  Yes Rilla Baller, MD  diclofenac Sodium (VOLTAREN) 1 % GEL Apply 1 application topically 4 (four) times daily as needed (pain).   Yes [provider]  docusate sodium  (COLACE) 100 MG capsule Take 1 capsule (100 mg total) by mouth daily as needed. 04/02/24 04/02/25 Yes Jule Ronal CROME, PA-C  doxycycline  (VIBRAMYCIN ) 100 MG capsule Take 1 capsule (100 mg total) by mouth 2 (two) times daily. To be taken after surgery Patient not taking: No sig reported 04/02/24   Jule Ronal CROME, PA-C  Dulaglutide  (TRULICITY ) 1.5 MG/0.5ML SOAJ Inject 1.5 mg into the skin once a week. 01/13/24  Yes Rilla Baller, MD  enalapril  (VASOTEC ) 20 MG tablet Take 1 tablet (20 mg total) by mouth daily. 12/16/23  Yes Rilla Baller, MD  fluticasone (FLONASE) 50 MCG/ACT nasal spray Place 1 spray into both nostrils at bedtime.   Yes [provider]  metFORMIN  (GLUCOPHAGE ) 500 MG tablet Take 1 tablet (500 mg total) by mouth 2 (two) times daily with a meal. 03/19/24  Yes Rilla Baller, MD  methocarbamol  (ROBAXIN ) 750 MG tablet Take 1 tablet (750 mg total) by mouth 3 (three) times daily as needed. Patient not taking: No sig reported 04/02/24   Jule Ronal CROME, PA-C  metoprolol  succinate (TOPROL -XL) 50 MG 24 hr tablet TAKE 1 TABLET BY MOUTH ONCE DAILY WITH MEAL OR  IMMEDIATELY  FOLLOWING  A  MEAL 12/16/23  Yes Rilla Baller, MD  ondansetron  (ZOFRAN ) 4 MG tablet Take 1 tablet (4 mg total) by mouth every 8 (eight) hours as needed for nausea or vomiting. Patient not taking: No sig reported 04/02/24   Jule Ronal CROME, PA-C  oxyCODONE -acetaminophen  (PERCOCET) 5-325 MG tablet Take 1-2 tablets by mouth every 8 (eight) hours as needed. To be taken after surgery Patient not taking: No sig reported 04/02/24   Jule Ronal CROME, PA-C  Blood  Glucose Monitoring Suppl (TRUE METRIX METER) w/Device KIT Use as instructed to check blood sugar once a day 09/23/23   Rilla Baller, MD  glucose blood (TRUE METRIX BLOOD GLUCOSE TEST) test strip Use to test once a day DX E11.6 04/01/24   Rilla Baller, MD  Tuberculin-Allergy Syringes (ALLERGY SYRINGE 1CC/27GX1/2) 27G X 1/2 1 ML MISC See admin instructions. 04/05/19   [provider]     Positive ROS: All other systems have been reviewed and were otherwise negative with the exception of those mentioned in the HPI and as above.  Physical Exam: General: Alert, no acute distress Cardiovascular: No pedal edema Respiratory: No cyanosis, no use of accessory musculature GI: abdomen soft Skin: No  lesions in the area of chief complaint Neurologic: Sensation intact distally Psychiatric: Patient is competent for consent with normal mood and affect Lymphatic: no lymphedema  MUSCULOSKELETAL: exam stable  Assessment: right knee osteoarthritis  Plan: Plan for Procedure(s): ARTHROPLASTY, KNEE, TOTAL  The risks benefits and alternatives were discussed with the patient including but not limited to the risks of nonoperative treatment, versus surgical intervention including infection, bleeding, nerve injury,  blood clots, cardiopulmonary complications, morbidity, mortality, among others, and they were willing to proceed.   Ozell Cummins, MD 04/12/2024 6:27 AM

## 2024-04-12 NOTE — Evaluation (Signed)
 Physical Therapy Evaluation Patient Details Name: James Robbins. MRN: [id number] DOB: 1951/04/23 Today's Date: 04/12/2024  History of Present Illness  73 y.o. male presents to [facility] hospital on 04/12/2024 for elective R TKA. PMH includes HTN, DMII, OSA, PAF.  Clinical Impression  Pt presents to PT with deficits in functional mobility, gait, balance, strength, ROM. Pt is able to ambulate for household distances with support of the RW. PT provides education on the TKR exercise packet and encourages frequent mobilization with staff assistance. PT will follow up tomorrow for a progression of gait and to initiate stair training.         If plan is discharge home, recommend the following: A little help with walking and/or transfers;A little help with bathing/dressing/bathroom;Assistance with cooking/housework;Assist for transportation;Help with stairs or ramp for entrance   Can travel by private vehicle        Equipment Recommendations None recommended by PT  Recommendations for Other Services       Functional Status Assessment Patient has had a recent decline in their functional status and demonstrates the ability to make significant improvements in function in a reasonable and predictable amount of time.     Precautions / Restrictions Precautions Precautions: Fall;Knee Precaution Booklet Issued: Yes (comment) Recall of Precautions/Restrictions: Intact Restrictions Weight Bearing Restrictions Per Provider Order: Yes RLE Weight Bearing Per Provider Order: Weight bearing as tolerated      Mobility  Bed Mobility Overal bed mobility: Needs Assistance Bed Mobility: Supine to Sit, Sit to Supine     Supine to sit: Supervision Sit to supine: Supervision        Transfers Overall transfer level: Needs assistance Equipment used: Rolling walker (2 wheels) Transfers: Sit to/from Stand Sit to Stand: Contact guard assist                Ambulation/Gait Ambulation/Gait  assistance: Contact guard assist Gait Distance (Feet): 40 Feet Assistive device: Rolling walker (2 wheels) Gait Pattern/deviations: Step-through pattern Gait velocity: reduced Gait velocity interpretation: <1.8 ft/sec, indicate of risk for recurrent falls   General Gait Details: slowed step-through gait, reduced stance time on RLE  Stairs            Wheelchair Mobility     Tilt Bed    Modified Rankin (Stroke Patients Only)       Balance Overall balance assessment: Needs assistance Sitting-balance support: No upper extremity supported, Feet supported Sitting balance-Leahy Scale: Good     Standing balance support: Bilateral upper extremity supported, Reliant on assistive device for balance Standing balance-Leahy Scale: Poor                               Pertinent Vitals/Pain Pain Assessment Pain Assessment: 0-10 Pain Score: 6  Pain Location: R knee Pain Descriptors / Indicators: Aching Pain Intervention(s): Monitored during session    Home Living Family/patient expects to be discharged to:: Private residence Living Arrangements: Spouse/significant other Available Help at Discharge: Family;Available 24 hours/day Type of Home: House Home Access: Stairs to enter Entrance Stairs-Rails: None Entrance Stairs-Number of Steps: 2 Alternate Level Stairs-Number of Steps: 7 Home Layout: Multi-level Home Equipment: Agricultural Consultant (2 wheels);Cane - single point;Toilet riser;Shower seat      Prior Function Prior Level of Function : Independent/Modified Independent;Driving             Mobility Comments: ambulatory without DME       Extremity/Trunk Assessment   Upper Extremity Assessment Upper  Extremity Assessment: Overall WFL for tasks assessed    Lower Extremity Assessment Lower Extremity Assessment: RLE deficits/detail RLE Deficits / Details: generalized post-op weakness and ROM deficits as anticipated on POD 0    Cervical / Trunk  Assessment Cervical / Trunk Assessment: Normal  Communication   Communication Communication: No apparent difficulties    Cognition Arousal: Alert Behavior During Therapy: WFL for tasks assessed/performed   PT - Cognitive impairments: No apparent impairments                         Following commands: Intact       Cueing Cueing Techniques: Verbal cues     General Comments General comments (skin integrity, edema, etc.): VSS on RA    Exercises Other Exercises Other Exercises: PT provides education on TKR exercise packet   Assessment/Plan    PT Assessment Patient needs continued PT services  PT Problem List Decreased strength;Decreased range of motion;Decreased activity tolerance;Decreased mobility;Decreased balance;Pain       PT Treatment Interventions DME instruction;Gait training;Stair training;Functional mobility training;Therapeutic activities;Therapeutic exercise;Balance training;Neuromuscular re-education;Patient/family education    PT Goals (Current goals can be found in the Care Plan section)  Acute Rehab PT Goals Patient Stated Goal: to return to independence PT Goal Formulation: With patient Time For Goal Achievement: 04/16/24 Potential to Achieve Goals: Good    Frequency 7X/week     Co-evaluation               AM-PAC PT 6 Clicks Mobility  Outcome Measure Help needed turning from your back to your side while in a flat bed without using bedrails?: A Little Help needed moving from lying on your back to sitting on the side of a flat bed without using bedrails?: A Little Help needed moving to and from a bed to a chair (including a wheelchair)?: A Little Help needed standing up from a chair using your arms (e.g., wheelchair or bedside chair)?: A Little Help needed to walk in hospital room?: A Little Help needed climbing 3-5 steps with a railing? : A Lot 6 Click Score: 17    End of Session Equipment Utilized During Treatment: Gait  belt Activity Tolerance: Patient tolerated treatment well Patient left: in bed;with call bell/phone within reach;with family/visitor present Nurse Communication: Mobility status PT Visit Diagnosis: Other abnormalities of gait and mobility (R26.89);Muscle weakness (generalized) (M62.81);Pain Pain - Right/Left: Right Pain - part of body: Knee    Time: 1421-1441 PT Time Calculation (min) (ACUTE ONLY): 20 min   Charges:   PT Evaluation $PT Eval Low Complexity: 1 Low   PT General Charges $$ ACUTE PT VISIT: 1 Visit         Bernardino JINNY Ruth, PT, DPT Acute Rehabilitation Office [phone]   Bernardino JINNY Ruth 04/12/2024, 3:03 PM

## 2024-04-12 NOTE — Discharge Instructions (Addendum)
 INSTRUCTIONS AFTER JOINT REPLACEMENT   Remove items at home which could result in a fall. This includes throw rugs or furniture in walking pathways ICE to the affected joint every three hours while awake for 30 minutes at a time, for at least the first 3-5 days, and then as needed for pain and swelling.  Continue to use ice for pain and swelling. You may notice swelling that will progress down to the foot and ankle.  This is normal after surgery.  Elevate your leg when you are not up walking on it.   Continue to use the breathing machine you got in the hospital (incentive spirometer) which will help keep your temperature down.  It is common for your temperature to cycle up and down following surgery, especially at night when you are not up moving around and exerting yourself.  The breathing machine keeps your lungs expanded and your temperature down.   DIET:  As you were doing prior to hospitalization, we recommend a well-balanced diet.  DRESSING / WOUND CARE / SHOWERING  Keep the surgical dressing until follow up.  The dressing is water proof, so you can shower without any extra covering.  IF THE DRESSING FALLS OFF or the wound gets wet inside, change the dressing with sterile gauze.  Please use good hand washing techniques before changing the dressing.  Do not use any lotions or creams on the incision until instructed by your surgeon.    ACTIVITY  Increase activity slowly as tolerated, but follow the weight bearing instructions below.   No driving for 6 weeks or until further direction given by your physician.  You cannot drive while taking narcotics.  No lifting or carrying greater than 10 lbs. until further directed by your surgeon. Avoid periods of inactivity such as sitting longer than an hour when not asleep. This helps prevent blood clots.  You may return to work once you are authorized by your doctor.     WEIGHT BEARING   Weight bearing as tolerated with assist device (walker, cane,  etc) as directed, use it as long as suggested by your surgeon or therapist, typically at least 4-6 weeks.   EXERCISES  Results after joint replacement surgery are often greatly improved when you follow the exercise, range of motion and muscle strengthening exercises prescribed by your doctor. Safety measures are also important to protect the joint from further injury. Any time any of these exercises cause you to have increased pain or swelling, decrease what you are doing until you are comfortable again and then slowly increase them. If you have problems or questions, call your caregiver or physical therapist for advice.   Rehabilitation is important following a joint replacement. After just a few days of immobilization, the muscles of the leg can become weakened and shrink (atrophy).  These exercises are designed to build up the tone and strength of the thigh and leg muscles and to improve motion. Often times heat used for twenty to thirty minutes before working out will loosen up your tissues and help with improving the range of motion but do not use heat for the first two weeks following surgery (sometimes heat can increase post-operative swelling).   These exercises can be done on a training (exercise) mat, on the floor, on a table or on a bed. Use whatever works the best and is most comfortable for you.    Use music or television while you are exercising so that the exercises are a pleasant break in your  day. This will make your life better with the exercises acting as a break in your routine that you can look forward to.   Perform all exercises about fifteen times, three times per day or as directed.  You should exercise both the operative leg and the other leg as well.  Exercises include:   Quad Sets - Tighten up the muscle on the front of the thigh (Quad) and hold for 5-10 seconds.   Straight Leg Raises - With your knee straight (if you were given a brace, keep it on), lift the leg to 60  degrees, hold for 3 seconds, and slowly lower the leg.  Perform this exercise against resistance later as your leg gets stronger.  Leg Slides: Lying on your back, slowly slide your foot toward your buttocks, bending your knee up off the floor (only go as far as is comfortable). Then slowly slide your foot back down until your leg is flat on the floor again.  Angel Wings: Lying on your back spread your legs to the side as far apart as you can without causing discomfort.  Hamstring Strength:  Lying on your back, push your heel against the floor with your leg straight by tightening up the muscles of your buttocks.  Repeat, but this time bend your knee to a comfortable angle, and push your heel against the floor.  You may put a pillow under the heel to make it more comfortable if necessary.   A rehabilitation program following joint replacement surgery can speed recovery and prevent re-injury in the future due to weakened muscles. Contact your doctor or a physical therapist for more information on knee rehabilitation.    CONSTIPATION  Constipation is defined medically as fewer than three stools per week and severe constipation as less than one stool per week.  Even if you have a regular bowel pattern at home, your normal regimen is likely to be disrupted due to multiple reasons following surgery.  Combination of anesthesia, postoperative narcotics, change in appetite and fluid intake all can affect your bowels.   YOU MUST use at least one of the following options; they are listed in order of increasing strength to get the job done.  They are all available over the counter, and you may need to use some, POSSIBLY even all of these options:    Drink plenty of fluids (prune juice may be helpful) and high fiber foods Colace 100 mg by mouth twice a day  Senokot for constipation as directed and as needed Dulcolax (bisacodyl), take with full glass of water  Miralax (polyethylene glycol) once or twice a day as  needed.  If you have tried all these things and are unable to have a bowel movement in the first 3-4 days after surgery call either your surgeon or your primary doctor.    If you experience loose stools or diarrhea, hold the medications until you stool forms back up.  If your symptoms do not get better within 1 week or if they get worse, check with your doctor.  If you experience the worst abdominal pain ever or develop nausea or vomiting, please contact the office immediately for further recommendations for treatment.   ITCHING:  If you experience itching with your medications, try taking only a single pain pill, or even half a pain pill at a time.  You can also use Benadryl over the counter for itching or also to help with sleep.   TED HOSE STOCKINGS:  Use stockings on both  legs until for at least 2 weeks or as directed by physician office. They may be removed at night for sleeping.  MEDICATIONS:  See your medication summary on the "After Visit Summary" that nursing will review with you.  You may have some home medications which will be placed on hold until you complete the course of blood thinner medication.  It is important for you to complete the blood thinner medication as prescribed.  PRECAUTIONS:  If you experience chest pain or shortness of breath - call 911 immediately for transfer to the hospital emergency department.   If you develop a fever greater that 101 F, purulent drainage from wound, increased redness or drainage from wound, foul odor from the wound/dressing, or calf pain - CONTACT YOUR SURGEON.                                                   FOLLOW-UP APPOINTMENTS:  If you do not already have a post-op appointment, please call the office for an appointment to be seen by your surgeon.  Guidelines for how soon to be seen are listed in your "After Visit Summary", but are typically between 1-4 weeks after surgery.  OTHER INSTRUCTIONS:   Knee Replacement:  Do not place pillow  under knee, focus on keeping the knee straight while resting. CPM instructions: 0-90 degrees, 2 hours in the morning, 2 hours in the afternoon, and 2 hours in the evening. Place foam block, curve side up under heel at all times except when in CPM or when walking.  DO NOT modify, tear, cut, or change the foam block in any way.  POST-OPERATIVE OPIOID TAPER INSTRUCTIONS: It is important to wean off of your opioid medication as soon as possible. If you do not need pain medication after your surgery it is ok to stop day one. Opioids include: Codeine, Hydrocodone(Norco, Vicodin), Oxycodone (Percocet, oxycontin ) and hydromorphone  amongst others.  Long term and even short term use of opiods can cause: Increased pain response Dependence Constipation Depression Respiratory depression And more.  Withdrawal symptoms can include Flu like symptoms Nausea, vomiting And more Techniques to manage these symptoms Hydrate well Eat regular healthy meals Stay active Use relaxation techniques(deep breathing, meditating, yoga) Do Not substitute Alcohol to help with tapering If you have been on opioids for less than two weeks and do not have pain than it is ok to stop all together.  Plan to wean off of opioids This plan should start within one week post op of your joint replacement. Maintain the same interval or time between taking each dose and first decrease the dose.  Cut the total daily intake of opioids by one tablet each day Next start to increase the time between doses. The last dose that should be eliminated is the evening dose.   Per [facility] clinic policy, our goal is ensure optimal postoperative pain control with a multimodal pain management strategy. For all [facility] patients, our goal is to wean post-operative narcotic medications by 6 weeks post-operatively. If this is not possible due to utilization of pain medication prior to surgery, your [facility] doctor will support your acute  post-operative pain control for the first 6 weeks postoperatively, with a plan to transition you back to your primary pain team following that. [facility] will work to ensure a therapist, occupational.   MAKE SURE YOU:  Understand these  instructions.  Get help right away if you are not doing well or get worse.    Thank you for letting us  be a part of your medical care team.  It is a privilege we respect greatly.  We hope these instructions will help you stay on track for a fast and full recovery!   ________________________________________________________________________________________________________  Information on my medicine - ELIQUIS  (apixaban )  This medication education was reviewed with me or my healthcare representative as part of my discharge preparation.  You were taking this medication prior to this hospital admission for history of atrial fibrillation.    Elliquis will also provide post op blood clot prevention after you orthopedic surgery (kee replacement).   Why was Eliquis  prescribed for you? Eliquis  was prescribed for you to reduce the risk of a blood clot forming that can cause a stroke if you have a medical condition called atrial fibrillation (a type of irregular heartbeat) and will also provide post op blood clot prevention after you orthopedic surgery (kee replacement).   What do You need to know about Eliquis  ? Take your Eliquis  TWICE DAILY - one tablet in the morning and one tablet in the evening with or without food. If you have difficulty swallowing the tablet whole please discuss with your pharmacist how to take the medication safely.  Take Eliquis  exactly as prescribed by your doctor and DO NOT stop taking Eliquis  without talking to the doctor who prescribed the medication.  Stopping may increase your risk of developing a stroke.  Refill your prescription before you run out.  After discharge, you should have regular check-up appointments with your healthcare provider  that is prescribing your Eliquis .  In the future your dose may need to be changed if your kidney function or weight changes by a significant amount or as you get older.  What do you do if you miss a dose? If you miss a dose, take it as soon as you remember on the same day and resume taking twice daily.  Do not take more than one dose of ELIQUIS  at the same time to make up a missed dose.  Important Safety Information A possible side effect of Eliquis  is bleeding. You should call your healthcare provider right away if you experience any of the following: Bleeding from an injury or your nose that does not stop. Unusual colored urine (red or dark brown) or unusual colored stools (red or black). Unusual bruising for unknown reasons. A serious fall or if you hit your head (even if there is no bleeding).  Some medicines may interact with Eliquis  and might increase your risk of bleeding or clotting while on Eliquis . To help avoid this, consult your healthcare provider or pharmacist prior to using any new prescription or non-prescription medications, including herbals, vitamins, non-steroidal anti-inflammatory drugs (NSAIDs) and supplements.  This website has more information on Eliquis  (apixaban ): http://[email or web address] .com/eliquis dena

## 2024-04-12 NOTE — Anesthesia Procedure Notes (Signed)
 Anesthesia Regional Block: Adductor canal block   Pre-Anesthetic Checklist: , timeout performed,  Correct Patient, Correct Site, Correct Laterality,  Correct Procedure, Correct Position, site marked,  Risks and benefits discussed,  Surgical consent,  Pre-op evaluation,  At surgeon's request and post-op pain management  Laterality: Right  Prep: chloraprep       Needles:  Injection technique: Single-shot  Needle Type: Echogenic Stimulator Needle     Needle Length: 9cm  Needle Gauge: 21     Additional Needles:   Procedures:,,,, ultrasound used (permanent image in chart),,    Narrative:  Start time: 04/12/2024 8:16 AM End time: 04/12/2024 8:20 AM Injection made incrementally with aspirations every 5 mL.  Performed by: Personally  Anesthesiologist: Erma Thom SAUNDERS, MD  Additional Notes: Discussed risks and benefits of the nerve block in detail, including but not limited vascular injury, permanent nerve damage and infection.   Patient tolerated the procedure well. Local anesthetic introduced in an incremental fashion under minimal resistance after negative aspirations. No paresthesias were elicited. After completion of the procedure, no acute issues were identified and patient continued to be monitored by RN.

## 2024-04-12 NOTE — Anesthesia Procedure Notes (Signed)
 Spinal  Patient location during procedure: OR Start time: 04/12/2024 8:35 AM End time: 04/12/2024 8:40 AM Reason for block: surgical anesthesia Staffing Performed: anesthesiologist  Anesthesiologist: Erma Thom SAUNDERS, MD Performed by: Erma Thom SAUNDERS, MD Authorized by: Erma Thom SAUNDERS, MD   Preanesthetic Checklist Completed: patient identified, IV checked, site marked, risks and benefits discussed, surgical consent, monitors and equipment checked, pre-op evaluation and timeout performed Spinal Block Patient position: sitting Prep: DuraPrep Patient monitoring: heart rate, cardiac monitor, continuous pulse ox and blood pressure Approach: midline Location: L4-5 Needle Needle type: Pencan  Needle gauge: 24 G Assessment Sensory level: T6 Additional Notes Functioning IV was confirmed and monitors were applied. Sterile prep and drape, including hand hygiene and sterile gloves were used. The patient was positioned and the spine was prepped. The skin was anesthetized with lidocaine .  Free flow of clear CSF was obtained prior to injecting local anesthetic into the CSF.  The spinal needle aspirated freely following injection.  The needle was carefully withdrawn.  The patient tolerated the procedure well.

## 2024-04-12 NOTE — Telephone Encounter (Signed)
 Patient Product/process Development Scientist completed.    The patient is insured through [place]. Patient has Medicare and is not eligible for a copay card, but may be able to apply for patient assistance or Medicare RX Payment Plan (Patient Must reach out to their plan, if eligible for payment plan), if available.    Ran test claim for Eliquis  2.5 mg and the current 30 day co-pay is $0.00.   This test claim was processed through [facility]- copay amounts may vary at other pharmacies due to pharmacy/plan contracts, or as the patient moves through the different stages of their insurance plan.     Reyes Sharps, CPHT Pharmacy Technician Patient Advocate Specialist Lead [facility] Patient Advocate Team Direct Number: [phone]  Fax: [phone]

## 2024-04-12 NOTE — Transfer of Care (Signed)
 Immediate Anesthesia Transfer of Care Note  Patient: James Robbins.  Procedure(s) Performed: ARTHROPLASTY, KNEE, TOTAL (Right: Knee)  Patient Location: PACU  Anesthesia Type:MAC  Level of Consciousness: awake  Airway & Oxygen Therapy: Patient Spontanous Breathing and Patient connected to face mask oxygen  Post-op Assessment: Report given to RN, Post -op Vital signs reviewed and stable, and Patient moving all extremities  Post vital signs: Reviewed  Last Vitals:  Vitals Value Taken Time  BP 138/93 04/12/24 10:45  Temp    Pulse 58 04/12/24 10:48  Resp 16 04/12/24 10:48  SpO2 99 % 04/12/24 10:48  Vitals shown include unfiled device data.  Last Pain:  Vitals:   04/12/24 1037  TempSrc:   PainSc: Asleep         Complications: There were no known notable events for this encounter.

## 2024-04-13 ENCOUNTER — Encounter (HOSPITAL_COMMUNITY): Payer: Self-pay | Admitting: Orthopaedic Surgery

## 2024-04-13 DIAGNOSIS — N181 Chronic kidney disease, stage 1: Secondary | ICD-10-CM | POA: Diagnosis not present

## 2024-04-13 DIAGNOSIS — M1711 Unilateral primary osteoarthritis, right knee: Secondary | ICD-10-CM | POA: Diagnosis not present

## 2024-04-13 DIAGNOSIS — Z79899 Other long term (current) drug therapy: Secondary | ICD-10-CM | POA: Diagnosis not present

## 2024-04-13 DIAGNOSIS — E1122 Type 2 diabetes mellitus with diabetic chronic kidney disease: Secondary | ICD-10-CM | POA: Diagnosis not present

## 2024-04-13 DIAGNOSIS — I129 Hypertensive chronic kidney disease with stage 1 through stage 4 chronic kidney disease, or unspecified chronic kidney disease: Secondary | ICD-10-CM | POA: Diagnosis not present

## 2024-04-13 DIAGNOSIS — Z7984 Long term (current) use of oral hypoglycemic drugs: Secondary | ICD-10-CM | POA: Diagnosis not present

## 2024-04-13 DIAGNOSIS — Z96652 Presence of left artificial knee joint: Secondary | ICD-10-CM | POA: Diagnosis not present

## 2024-04-13 DIAGNOSIS — Z7901 Long term (current) use of anticoagulants: Secondary | ICD-10-CM | POA: Diagnosis not present

## 2024-04-13 DIAGNOSIS — E1140 Type 2 diabetes mellitus with diabetic neuropathy, unspecified: Secondary | ICD-10-CM | POA: Diagnosis not present

## 2024-04-13 LAB — GLUCOSE, CAPILLARY: Glucose-Capillary: 153 mg/dL — ABNORMAL HIGH (ref 70–99)

## 2024-04-13 NOTE — Evaluation (Signed)
 Occupational Therapy Evaluation Patient Details Name: James Robbins. MRN: [id number] DOB: 06-07-1951 Today's Date: 04/13/2024   History of Present Illness   73 y.o. male presents to [facility] hospital on 04/12/2024 for elective R TKA. PMH includes HTN, DMII, OSA, PAF.     Clinical Impressions Pt ind at baseline with ADLs/functional mobility, lives with spouse who will be available the first week to assist. PT currently needs up to min A for ADLs, CGA for transfers with RW. Discussed compensatory strategies for LB ADLs, keeping knee straight/using bone foam, and performed simulated tub transfer. Pt presenting with impairments listed below, however has no further acute OT needs at this time, will s/o. Follow physician recommendations for f/u therapies.     If plan is discharge home, recommend the following:   A little help with walking and/or transfers;A little help with bathing/dressing/bathroom;Direct supervision/assist for medications management;Direct supervision/assist for financial management;Assistance with cooking/housework;Assist for transportation     Functional Status Assessment   Patient has had a recent decline in their functional status and demonstrates the ability to make significant improvements in function in a reasonable and predictable amount of time.     Equipment Recommendations   None recommended by OT     Recommendations for Other Services   PT consult     Precautions/Restrictions   Precautions Precautions: Fall;Knee Recall of Precautions/Restrictions: Intact     Mobility Bed Mobility               General bed mobility comments: up in chair upon arrival    Transfers Overall transfer level: Needs assistance Equipment used: Rolling walker (2 wheels) Transfers: Sit to/from Stand Sit to Stand: Contact guard assist                  Balance Overall balance assessment: Needs assistance Sitting-balance support: No upper extremity  supported, Feet supported Sitting balance-Leahy Scale: Good     Standing balance support: Bilateral upper extremity supported, Reliant on assistive device for balance Standing balance-Leahy Scale: Poor                             ADL either performed or assessed with clinical judgement   ADL Overall ADL's : Needs assistance/impaired Eating/Feeding: Set up;Sitting   Grooming: Set up;Sitting   Upper Body Bathing: Contact guard assist;Sitting   Lower Body Bathing: Contact guard assist;Sitting/lateral leans   Upper Body Dressing : Contact guard assist;Sitting   Lower Body Dressing: Minimal assistance;Sitting/lateral leans   Toilet Transfer: Contact guard assist   Toileting- Clothing Manipulation and Hygiene: Contact guard assist;Sitting/lateral lean       Functional mobility during ADLs: Contact guard assist;Rolling walker (2 wheels)       Vision   Vision Assessment?: No apparent visual deficits     Perception Perception: Not tested       Praxis Praxis: Not tested       Pertinent Vitals/Pain Pain Assessment Pain Assessment: Faces Pain Score: 6  Faces Pain Scale: Hurts even more Pain Location: R knee Pain Descriptors / Indicators: Aching Pain Intervention(s): Limited activity within patient's tolerance, Monitored during session, Repositioned     Extremity/Trunk Assessment Upper Extremity Assessment Upper Extremity Assessment: Overall WFL for tasks assessed   Lower Extremity Assessment Lower Extremity Assessment: Defer to PT evaluation   Cervical / Trunk Assessment Cervical / Trunk Assessment: Normal   Communication Communication Communication: No apparent difficulties   Cognition Arousal: Alert Behavior During Therapy: [facility]  for tasks assessed/performed                                 Following commands: Intact       Cueing  General Comments   Cueing Techniques: Verbal cues  VSS on RA   Exercises     Shoulder  Instructions      Home Living Family/patient expects to be discharged to:: Private residence Living Arrangements: Spouse/significant other Available Help at Discharge: Family;Available 24 hours/day Type of Home: House Home Access: Stairs to enter [employer] of Steps: 2 Entrance Stairs-Rails: None Home Layout: Multi-level Alternate Level Stairs-Number of Steps: 7 Alternate Level Stairs-Rails: Right Bathroom Shower/Tub: Producer, Television/film/video: Standard     Home Equipment: Agricultural Consultant (2 wheels);Cane - single point;Toilet riser;Shower seat          Prior Functioning/Environment Prior Level of Function : Independent/Modified Independent;Driving             Mobility Comments: ambulatory without DME ADLs Comments: ind    OT Problem List: Decreased strength;Decreased range of motion;Impaired balance (sitting and/or standing)   OT Treatment/Interventions:        OT Goals(Current goals can be found in the care plan section)   Acute Rehab OT Goals Patient Stated Goal: none stated OT Goal Formulation: With patient Time For Goal Achievement: 04/27/24 Potential to Achieve Goals: Good   OT Frequency:       Co-evaluation              AM-PAC OT 6 Clicks Daily Activity     Outcome Measure Help from another person eating meals?: None Help from another person taking care of personal grooming?: None Help from another person toileting, which includes using toliet, bedpan, or urinal?: A Little Help from another person bathing (including washing, rinsing, drying)?: A Little Help from another person to put on and taking off regular upper body clothing?: A Little Help from another person to put on and taking off regular lower body clothing?: A Little 6 Click Score: 20   End of Session Equipment Utilized During Treatment: Rolling walker (2 wheels) Nurse Communication: Mobility status  Activity Tolerance: Patient tolerated treatment  well Patient left: in chair;with call bell/phone within reach  OT Visit Diagnosis: Muscle weakness (generalized) (M62.81);Unsteadiness on feet (R26.81)                Time: 9270-9247 OT Time Calculation (min): 23 min Charges:  OT General Charges $OT Visit: 1 Visit OT Evaluation $OT Eval Low Complexity: 1 Low OT Treatments $Self Care/Home Management : 8-22 mins  Cayson Kalb K, OTD, OTR/L SecureChat Preferred Acute Rehab [phone]   Laneta POUR Koonce 04/13/2024, 8:49 AM

## 2024-04-13 NOTE — Progress Notes (Signed)
 Subjective: 1 Day Post-Op Procedure(s) (LRB): ARTHROPLASTY, KNEE, TOTAL (Right) Patient reports pain as mild.    Objective: Vital signs in last 24 hours: Temp:  [97.5 F (36.4 C)-98.5 F (36.9 C)] 97.6 F (36.4 C) (11/04 0730) Pulse Rate:  [62-87] 75 (11/04 0730) Resp:  [14-23] 18 (11/04 0730) BP: (121-147)/(76-93) 143/88 (11/04 0730) SpO2:  [95 %-100 %] 99 % (11/04 0730)  Intake/Output from previous day: 11/03 0701 - 11/04 0700 In: 1900 [P.O.:1200; I.V.:300; IV Piggyback:400] Out: 1585 [Urine:1575; Blood:10] Intake/Output this shift: No intake/output data recorded.  No results for input(s): HGB in the last 72 hours. No results for input(s): WBC, RBC, HCT, PLT in the last 72 hours. No results for input(s): NA, K, CL, CO2, BUN, CREATININE, GLUCOSE, CALCIUM  in the last 72 hours. No results for input(s): LABPT, INR in the last 72 hours.  Neurologically intact Neurovascular intact Sensation intact distally Intact pulses distally Dorsiflexion/Plantar flexion intact Incision: scant drainage No cellulitis present Compartment soft   Assessment/Plan: 1 Day Post-Op Procedure(s) (LRB): ARTHROPLASTY, KNEE, TOTAL (Right) Advance diet Up with therapy D/C IV fluids Discharge home with home health once cleared by PT WBAT RLE      Ronal LITTIE Grave 04/13/2024, 8:09 AM

## 2024-04-13 NOTE — Progress Notes (Signed)
 Physical Therapy Treatment Patient Details Name: James Robbins. MRN: [id number] DOB: 02-15-1951 Today's Date: 04/13/2024   History of Present Illness 73 y.o. male presents to [facility] hospital on 04/12/2024 for elective R TKA. PMH includes HTN, DMII, OSA, PAF.    PT Comments  Pt return demonstrated good sideways technique to negotiate 11 stairs necessary to access home. Pt with report of minimal pain. Focused on knee extension during ambulation and less trunk flexion and bilat UE dependency. Acute PT to cont to follow.    If plan is discharge home, recommend the following: A little help with walking and/or transfers;A little help with bathing/dressing/bathroom;Assistance with cooking/housework;Assist for transportation;Help with stairs or ramp for entrance   Can travel by private vehicle        Equipment Recommendations  None recommended by PT    Recommendations for Other Services       Precautions / Restrictions Precautions Precautions: Fall;Knee Precaution Booklet Issued: Yes (comment) Recall of Precautions/Restrictions: Intact Restrictions Weight Bearing Restrictions Per Provider Order: Yes RLE Weight Bearing Per Provider Order: Weight bearing as tolerated     Mobility  Bed Mobility               General bed mobility comments: up in chair upon arrival    Transfers Overall transfer level: Needs assistance Equipment used: Rolling walker (2 wheels) Transfers: Sit to/from Stand Sit to Stand: Supervision           General transfer comment: verbal cues to push up from arm rests    Ambulation/Gait Ambulation/Gait assistance: Contact guard assist Gait Distance (Feet): 200 Feet Assistive device: Rolling walker (2 wheels) Gait Pattern/deviations: Step-through pattern Gait velocity: impulsively quick Gait velocity interpretation: 1.31 - 2.62 ft/sec, indicative of limited community ambulator   General Gait Details: increased bilat UE dependence, verbal cues to  complete R quad set during stance phase to promote knee extension   Stairs Stairs: Yes Stairs assistance: Contact guard assist Stair Management: One rail Right, Step to pattern, Sideways Number of Stairs: 11 General stair comments: educated wife on sideways technique, pt with good return demonstrate   Wheelchair Mobility     Tilt Bed    Modified Rankin (Stroke Patients Only)       Balance Overall balance assessment: Needs assistance Sitting-balance support: No upper extremity supported, Feet supported Sitting balance-Leahy Scale: Good     Standing balance support: Bilateral upper extremity supported, Reliant on assistive device for balance Standing balance-Leahy Scale: Poor                              Communication Communication Communication: No apparent difficulties  Cognition Arousal: Alert Behavior During Therapy: WFL for tasks assessed/performed   PT - Cognitive impairments: No apparent impairments                         Following commands: Intact      Cueing Cueing Techniques: Verbal cues  Exercises      General Comments General comments (skin integrity, edema, etc.): VSS on RA      Pertinent Vitals/Pain Pain Assessment Faces Pain Scale: Hurts even more Pain Location: R knee Pain Descriptors / Indicators: Aching Pain Intervention(s): Monitored during session    Home Living Family/patient expects to be discharged to:: Private residence Living Arrangements: Spouse/significant other Available Help at Discharge: Family;Available 24 hours/day Type of Home: House Home Access: Stairs to enter Entrance Stairs-Rails: None  Entrance Stairs-Number of Steps: 2 Alternate Level Stairs-Number of Steps: 7 Home Layout: Multi-level Home Equipment: Rolling Walker (2 wheels);Cane - single point;Toilet riser;Shower seat      Prior Function            PT Goals (current goals can now be found in the care plan section) Acute Rehab PT  Goals Patient Stated Goal: to return to independence PT Goal Formulation: With patient Time For Goal Achievement: 04/16/24 Potential to Achieve Goals: Good Progress towards PT goals: Progressing toward goals    Frequency    7X/week      PT Plan      Co-evaluation              AM-PAC PT 6 Clicks Mobility   Outcome Measure  Help needed turning from your back to your side while in a flat bed without using bedrails?: A Little Help needed moving from lying on your back to sitting on the side of a flat bed without using bedrails?: A Little Help needed moving to and from a bed to a chair (including a wheelchair)?: A Little Help needed standing up from a chair using your arms (e.g., wheelchair or bedside chair)?: A Little Help needed to walk in hospital room?: A Little Help needed climbing 3-5 steps with a railing? : A Little 6 Click Score: 18    End of Session Equipment Utilized During Treatment: Gait belt Activity Tolerance: Patient tolerated treatment well Patient left: in bed;with call bell/phone within reach;with family/visitor present Nurse Communication: Mobility status PT Visit Diagnosis: Other abnormalities of gait and mobility (R26.89);Muscle weakness (generalized) (M62.81);Pain Pain - Right/Left: Right Pain - part of body: Knee     Time: 0859-0910 PT Time Calculation (min) (ACUTE ONLY): 11 min  Charges:    $Gait Training: 8-22 mins PT General Charges $$ ACUTE PT VISIT: 1 Visit                     Norene Ames, PT, DPT Acute Rehabilitation Services Secure chat preferred Office #: [phone]    Norene CHRISTELLA Ames 04/13/2024, 9:13 AM

## 2024-04-13 NOTE — Discharge Summary (Signed)
 Patient ID: James Robbins. MRN: [id number] DOB/AGE: [birth year] 75 y.o.  Admit date: 04/12/2024 Discharge date: 04/13/2024  Admission Diagnoses:  Principal Problem:   Primary osteoarthritis of right knee Active Problems:   Status post total right knee replacement   Discharge Diagnoses:  Same  Past Medical History:  Diagnosis Date   Arthritis    Atrial fibrillation (HCC)    Chronic kidney disease (CKD), stage I    proteinuria   Controlled type 2 diabetes mellitus with diabetic nephropathy (HCC)    Completed DSME 2017   Dysrhythmia    A. Fib   Hematuria 2014   s/p normal urological workup ([place])   Hyperlipidemia    Hypertension    Obesity    Seasonal allergic rhinitis    Sleep apnea    has cpap machine; does not use    Surgeries: Procedure(s): ARTHROPLASTY, KNEE, TOTAL on 04/12/2024   Consultants:   Discharged Condition: Improved  Hospital Course: Ayham A Frances Joynt. is an 73 y.o. male who was admitted 04/12/2024 for operative treatment ofPrimary osteoarthritis of right knee. Patient has severe unremitting pain that affects sleep, daily activities, and work/hobbies. After pre-op clearance the patient was taken to the operating room on 04/12/2024 and underwent  Procedure(s): ARTHROPLASTY, KNEE, TOTAL.    Patient was given perioperative antibiotics:  Anti-infectives (From admission, onward)    Start     Dose/Rate Route Frequency Ordered Stop   04/12/24 1400  ceFAZolin  (ANCEF ) IVPB 2g/100 mL premix        2 g 200 mL/hr over 30 Minutes Intravenous Every 6 hours 04/12/24 1154 04/13/24 0032   04/12/24 0932  vancomycin  (VANCOCIN ) powder  Status:  Discontinued          As needed 04/12/24 0932 04/12/24 1034   04/12/24 0645  ceFAZolin  (ANCEF ) IVPB 2g/100 mL premix        2 g 200 mL/hr over 30 Minutes Intravenous On call to O.R. 04/12/24 [id number] 04/12/24 0916        Patient was given sequential compression devices, early ambulation, and chemoprophylaxis to prevent DVT.   Inpatient Morphine Milligram Equivalents Per Day 11/3 - 11/4   Values displayed are in units of MME/Day    Order Start / End Date Yesterday Today    oxyCODONE  (Oxy IR/ROXICODONE ) immediate release tablet 5 mg 11/3 - 11/3 0 of Unknown --    oxyCODONE  (ROXICODONE ) 5 MG/5ML solution 5 mg 11/3 - 11/3 0 of Unknown --      Group total: 0 of Unknown     fentaNYL  (SUBLIMAZE ) injection 25-50 mcg 11/3 - 11/3 0 of 45-90 --    oxyCODONE  (Oxy IR/ROXICODONE ) immediate release tablet 5 mg 11/3 - No end date 0 of 22.5 0 of 30    oxyCODONE  (Oxy IR/ROXICODONE ) immediate release tablet 10 mg 11/3 - No end date 0 of 45 0 of 60    HYDROmorphone  (DILAUDID ) injection 1 mg 11/3 - No end date 0 of 40 0 of 60    fentaNYL  (SUBLIMAZE ) 100 MCG/2ML injection 11/3 - 11/3 Unknown of Unknown --    fentaNYL  (SUBLIMAZE ) injection 11/3 - 11/3 *15 of 15 --    Daily Totals  * Unknown (at least 15) of Unknown (at least 167.5-212.5) 0 of 150  *One-Step medication  Calculation Errors     Order Type Date Details   oxyCODONE  (Oxy IR/ROXICODONE ) immediate release tablet 5 mg Ordered Dose -- Insufficient frequency information   oxyCODONE  (ROXICODONE ) 5 MG/5ML solution 5 mg Ordered Dose --  Insufficient frequency information   fentaNYL  (SUBLIMAZE ) 100 MCG/2ML injection Ordered Dose -- Frequency type could not be determined    Administered Dose 04/12/24 No morphine equivalence factor found for FENTANYL  CITRATE (PF) 100 MCG/2ML IJ SOLN [[id number]] and route             Patient benefited maximally from hospital stay and there were no complications.    Recent vital signs: Patient Vitals for the past 24 hrs:  BP Temp Temp src Pulse Resp SpO2  04/13/24 0730 (!) 143/88 97.6 F (36.4 C) Oral 75 18 99 %  04/13/24 0421 137/81 98.2 F (36.8 C) Oral 71 18 98 %  04/12/24 2322 121/77 98.4 F (36.9 C) Oral 71 18 97 %  04/12/24 1922 (!) 147/83 97.9 F (36.6 C) -- 87 18 98 %  04/12/24 1548 132/82 98.1 F (36.7 C) Oral 74 20 96 %  04/12/24  1201 (!) 146/93 98.5 F (36.9 C) -- 71 20 96 %  04/12/24 1130 139/89 (!) 97.5 F (36.4 C) -- 73 16 96 %  04/12/24 1115 (!) 144/82 -- -- 67 14 95 %  04/12/24 1100 133/82 -- -- 63 18 95 %  04/12/24 1045 (!) 138/93 -- -- 63 18 100 %  04/12/24 1037 125/80 (!) 97.5 F (36.4 C) -- 62 (!) 23 100 %  04/12/24 0830 131/76 -- -- 65 -- 96 %  04/12/24 0825 135/78 -- -- 62 -- 98 %     Recent laboratory studies: No results for input(s): WBC, HGB, HCT, PLT, NA, K, CL, CO2, BUN, CREATININE, GLUCOSE, INR, CALCIUM  in the last 72 hours.  Invalid input(s): PT, 2   Discharge Medications:   Allergies as of 04/13/2024   No Known Allergies      Medication List     TAKE these medications    ALLERGY SYRINGE 1CC/27GX1/2 27G X 1/2 1 ML Misc See admin instructions.   amLODipine  10 MG tablet Commonly known as: NORVASC  Take 1 tablet (10 mg total) by mouth daily.   atorvastatin  40 MG tablet Commonly known as: LIPITOR Take 1 tablet (40 mg total) by mouth daily.   cetirizine 10 MG tablet Commonly known as: ZYRTEC Take 10 mg by mouth daily.   dapagliflozin  propanediol 10 MG Tabs tablet Commonly known as: Farxiga  Take 1 tablet (10 mg total) by mouth daily before breakfast.   diclofenac Sodium 1 % Gel Commonly known as: VOLTAREN Apply 1 application topically 4 (four) times daily as needed (pain).   docusate sodium  100 MG capsule Commonly known as: Colace Take 1 capsule (100 mg total) by mouth daily as needed.   doxycycline  100 MG capsule Commonly known as: Vibramycin  Take 1 capsule (100 mg total) by mouth 2 (two) times daily. To be taken after surgery   Eliquis  5 MG Tabs tablet Generic drug: apixaban  Take 1 tablet by mouth twice daily   enalapril  20 MG tablet Commonly known as: VASOTEC  Take 1 tablet (20 mg total) by mouth daily.   fluticasone 50 MCG/ACT nasal spray Commonly known as: FLONASE Place 1 spray into both nostrils at bedtime.   metFORMIN  500  MG tablet Commonly known as: GLUCOPHAGE  Take 1 tablet (500 mg total) by mouth 2 (two) times daily with a meal.   methocarbamol  750 MG tablet Commonly known as: ROBAXIN  Take 1 tablet (750 mg total) by mouth 3 (three) times daily as needed.   metoprolol  succinate 50 MG 24 hr tablet Commonly known as: TOPROL -XL TAKE 1 TABLET BY MOUTH ONCE DAILY WITH  MEAL OR  IMMEDIATELY  FOLLOWING  A  MEAL   ondansetron  4 MG tablet Commonly known as: Zofran  Take 1 tablet (4 mg total) by mouth every 8 (eight) hours as needed for nausea or vomiting.   oxyCODONE -acetaminophen  5-325 MG tablet Commonly known as: Percocet Take 1-2 tablets by mouth every 8 (eight) hours as needed. To be taken after surgery   True Metrix Blood Glucose Test test strip Generic drug: glucose blood Use to test once a day DX E11.6   True Metrix Meter w/Device Kit Use as instructed to check blood sugar once a day   Trulicity  1.5 MG/0.5ML Soaj Generic drug: Dulaglutide  Inject 1.5 mg into the skin once a week.   Vitamin D3 25 MCG (1000 UT) Caps Take 2 capsules (2,000 Units total) by mouth daily. What changed: how much to take               Durable Medical Equipment  (From admission, onward)           Start     Ordered   04/12/24 1154  DME Walker rolling  Once       Question Answer Comment  Walker: With 5 Inch Wheels   Patient needs a walker to treat with the following condition Status post left partial knee replacement      04/12/24 1154   04/12/24 1154  DME 3 n 1  Once        04/12/24 1154   04/12/24 1154  DME Bedside commode  Once       Question:  Patient needs a bedside commode to treat with the following condition  Answer:  Status post left partial knee replacement   04/12/24 1154            Diagnostic Studies: No results found.  Disposition: Discharge disposition: 01-Home or Self Care          Follow-up Information     [facility]) Follow up.    Specialty: Home Health Services Why: [facility] will contact you for the first home visit Contact information: [address] [place]  [phone]        Jule Ronal CROME, PA-C. Schedule an appointment as soon as possible for a visit in 2 week(s).   Specialty: Orthopedic Surgery Contact information: [address] [place] [address] [phone]                  Signed: Ronal CROME Jule 04/13/2024, 8:10 AM

## 2024-04-13 NOTE — Progress Notes (Signed)
 Patient ID: James Robbins., male   DOB: December 14, 1950, 73 y.o.   MRN: [id number] Patient alert and oriented x 4. Surgical site clean dry and intact. AVS printed and given to patient. Discharge instructions explained. No further questions at this time. No DME required.  Verdie JONETTA Collier, RN

## 2024-04-14 ENCOUNTER — Other Ambulatory Visit: Payer: Self-pay | Admitting: *Deleted

## 2024-04-14 DIAGNOSIS — Z96651 Presence of right artificial knee joint: Secondary | ICD-10-CM

## 2024-04-14 DIAGNOSIS — M1711 Unilateral primary osteoarthritis, right knee: Secondary | ICD-10-CM

## 2024-04-15 ENCOUNTER — Telehealth: Payer: Self-pay | Admitting: Orthopaedic Surgery

## 2024-04-15 NOTE — Telephone Encounter (Signed)
Called and gave verbal via voicemail.

## 2024-04-15 NOTE — Telephone Encounter (Signed)
 [facility] (PT) [facility] called requesting verbal orders 1wk 1, 3wk 1, and 1 wk 1. [facility] secure number is [phone].

## 2024-04-22 ENCOUNTER — Other Ambulatory Visit: Payer: Self-pay | Admitting: Physician Assistant

## 2024-04-22 ENCOUNTER — Telehealth: Payer: Self-pay | Admitting: Orthopaedic Surgery

## 2024-04-22 MED ORDER — METHOCARBAMOL 750 MG PO TABS: 750.0000 mg | ORAL_TABLET | Freq: Three times a day (TID) | ORAL | 2 refills | Status: DC | PRN

## 2024-04-22 MED ORDER — OXYCODONE-ACETAMINOPHEN 5-325 MG PO TABS: 1.0000 | ORAL_TABLET | Freq: Three times a day (TID) | ORAL | 0 refills | Status: DC | PRN

## 2024-04-22 NOTE — Telephone Encounter (Signed)
 sent

## 2024-04-22 NOTE — Telephone Encounter (Signed)
 Rx refill methocarbamol  and Oxycodone - Acetaminophen   [facility] @ [address]

## 2024-04-27 ENCOUNTER — Encounter: Payer: Self-pay | Admitting: Rehabilitative and Restorative Service Providers"

## 2024-04-27 ENCOUNTER — Ambulatory Visit: Admitting: Rehabilitative and Restorative Service Providers"

## 2024-04-27 ENCOUNTER — Ambulatory Visit (INDEPENDENT_AMBULATORY_CARE_PROVIDER_SITE_OTHER): Admitting: Physician Assistant

## 2024-04-27 DIAGNOSIS — R262 Difficulty in walking, not elsewhere classified: Secondary | ICD-10-CM

## 2024-04-27 DIAGNOSIS — J3081 Allergic rhinitis due to animal (cat) (dog) hair and dander: Secondary | ICD-10-CM | POA: Diagnosis not present

## 2024-04-27 DIAGNOSIS — M25661 Stiffness of right knee, not elsewhere classified: Secondary | ICD-10-CM | POA: Diagnosis not present

## 2024-04-27 DIAGNOSIS — J3089 Other allergic rhinitis: Secondary | ICD-10-CM | POA: Diagnosis not present

## 2024-04-27 DIAGNOSIS — M6281 Muscle weakness (generalized): Secondary | ICD-10-CM

## 2024-04-27 DIAGNOSIS — J301 Allergic rhinitis due to pollen: Secondary | ICD-10-CM | POA: Diagnosis not present

## 2024-04-27 DIAGNOSIS — M25561 Pain in right knee: Secondary | ICD-10-CM | POA: Diagnosis not present

## 2024-04-27 DIAGNOSIS — R600 Localized edema: Secondary | ICD-10-CM

## 2024-04-27 DIAGNOSIS — Z96651 Presence of right artificial knee joint: Secondary | ICD-10-CM

## 2024-04-27 NOTE — Therapy (Addendum)
 OUTPATIENT PHYSICAL THERAPY LOWER EXTREMITY EVALUATION  Referring diagnosis? S03.348 (ICD-10-CM) - Status post total right knee replacement M17.11 (ICD-10-CM) - Primary osteoarthritis of right knee  Treatment diagnosis? (if different than referring diagnosis) R26.2   M62.81   R60.0   M25.561   M25.661  What was this (referring dx) caused by? [x]  Surgery []  Fall []  Ongoing issue [x]  Arthritis []  Other: ____________  Laterality: [x]  Rt []  Lt []  Both  Check all possible CPT codes:  *CHOOSE 10 OR LESS*    See Planned Interventions listed in the Plan section of the Evaluation.   Patient Name: James Robbins. MRN: [id number] DOB:[birth year], 73 y.o., male Today's Date: 04/27/2024  END OF SESSION:  PT End of Session - 04/27/24 1349     Visit Number 1    Number of Visits 18    Date for Recertification  07/20/24    Authorization Type [employer]    Authorization - Visit Number 1    Progress Note Due on Visit 10    PT Start Time 1344    PT Stop Time 1434    PT Time Calculation (min) 50 min    Activity Tolerance Patient tolerated treatment well;No increased pain;Patient limited by fatigue;Patient limited by pain    Behavior During Therapy [facility] for tasks assessed/performed          Past Medical History:  Diagnosis Date   Arthritis    Atrial fibrillation (HCC)    Chronic kidney disease (CKD), stage I    proteinuria   Controlled type 2 diabetes mellitus with diabetic nephropathy (HCC)    Completed DSME 2017   Dysrhythmia    A. Fib   Hematuria 2014   s/p normal urological workup ([place])   Hyperlipidemia    Hypertension    Obesity    Seasonal allergic rhinitis    Sleep apnea    has cpap machine; does not use   Past Surgical History:  Procedure Laterality Date   CARDIOVERSION N/A 11/27/2020   Procedure: CARDIOVERSION;  Surgeon: Nahser, Aleene PARAS, MD;  Location: [facility] ENDOSCOPY;  Service: Cardiovascular;  Laterality: N/A;   CATARACT EXTRACTION Bilateral 08/2021   COLONOSCOPY   06/10/2004   per pt normal ([place])   TOTAL KNEE ARTHROPLASTY Left 06/16/2023   Procedure: LEFT TOTAL KNEE ARTHROPLASTY;  Surgeon: Jerri Kay HERO, MD;  Location: [facility] OR;  Service: Orthopedics;  Laterality: Left;   TOTAL KNEE ARTHROPLASTY Right 04/12/2024   Procedure: ARTHROPLASTY, KNEE, TOTAL;  Surgeon: Jerri Kay HERO, MD;  Location: [facility] OR;  Service: Orthopedics;  Laterality: Right;   VASECTOMY     VASECTOMY REVERSAL     Patient Active Problem List   Diagnosis Date Noted   Status post total right knee replacement 04/12/2024   Primary osteoarthritis of right knee 04/11/2024   Skin rash 03/19/2024   Right kidney mass 03/19/2024   Multiple acquired cysts of kidney 03/19/2024   Diabetic nephropathy with proteinuria (HCC) 12/16/2023   Status post total left knee replacement 06/16/2023   Hypercoagulable state due to permanent atrial fibrillation (HCC) 01/01/2023   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) 01/01/2022   Hypertensive retinopathy of both eyes 01/01/2022   Vision loss of right eye 09/06/2021   Medicare annual wellness visit, subsequent 09/05/2021   Right retinal artery branch occlusion 04/05/2021   Obstructive sleep apnea hypopnea, severe 11/07/2020   Permanent atrial fibrillation (HCC) 11/07/2020   Primary osteoarthritis of knees, bilateral 04/23/2019   Vitamin D  deficiency 09/23/2016   Advanced  care planning/counseling discussion 07/20/2015   Encounter for general adult medical examination with abnormal findings 02/21/2014   Severe obesity (BMI 35.0-39.9) with comorbidity (HCC)    Type 2 diabetes mellitus with persistent proteinuria (HCC)    Seasonal allergic rhinitis 12/24/2011   Hypertension 12/24/2011   Hyperlipidemia associated with type 2 diabetes mellitus (HCC) 12/24/2011    PCP: Anton Blas, MD  REFERRING PROVIDER: Kay CHRISTELLA Cummins, MD  REFERRING DIAG: [phone] (ICD-10-CM) - Status post total right knee replacement M17.11 (ICD-10-CM) - Primary  osteoarthritis of right knee  THERAPY DIAG:  Difficulty in walking, not elsewhere classified - Plan: PT plan of care cert/re-cert  Muscle weakness (generalized) - Plan: PT plan of care cert/re-cert  Localized edema - Plan: PT plan of care cert/re-cert  Acute pain of right knee - Plan: PT plan of care cert/re-cert  Stiffness of right knee, not elsewhere classified - Plan: PT plan of care cert/re-cert  Rationale for Evaluation and Treatment: Rehabilitation  ONSET DATE: 04/12/2024 Rt TKA  SUBJECTIVE:   SUBJECTIVE STATEMENT: Benno had his right knee replaced 15 days ago.  He had his left knee replaced in January of this year.  PERTINENT HISTORY: Atrial fibrillation, Stage 1 CKD, Type 2 DM, HLD, HTN, obesity, previous Lt TKA in January 2025   PAIN:  Are you having pain? Yes: NPRS scale: 3-5/10 the past 3 days Pain location: Rt knee Pain description: Achy Aggravating factors: Prolonged postures Relieving factors: Ice, tylenol , oxycodone  and a muscle relaxer  PRECAUTIONS: Other: See past medical history  RED FLAGS: None   WEIGHT BEARING RESTRICTIONS: No  FALLS:  Has patient fallen in last 6 months? No  LIVING ENVIRONMENT: Lives with: lives with their family and lives with their spouse Lives in: House/apartment Stairs: Manages with a handrail and some effort Has following equipment at home: Environmental Consultant - 2 wheeled  OCCUPATION: Retired  PLOF: Independent  PATIENT GOALS: Needs to get up and down 7 stairs now, will need to do more, 5 acres of yard work  NEXT MD VISIT: Today   OBJECTIVE:  Note: Objective measures were completed at Evaluation unless otherwise noted.  DIAGNOSTIC FINDINGS: IMPRESSION: 1. Immediate postoperative right total knee arthroplasty with components in anatomic alignment and no periprosthetic fracture or dislocation.  PATIENT SURVEYS:  PSFS: THE PATIENT SPECIFIC FUNCTIONAL SCALE  Place score of 0-10 (0 = unable to perform activity and 10 = able to  perform activity at the same level as before injury or problem)  Activity Date: 04/27/2024    Walking 4    2.  Working in the yard 3    3.  Taking out the trash 3    4.  Shopping 3    5.  Weed eating 3    Total Score 3.2      Total Score = Sum of activity scores/number of activities  Minimally Detectable Change: 3 points (for single activity); 2 points (for average score)  [employer] (nd). The Patient Specific Functional Scale . Retrieved from [email or web address]   COGNITION: Overall cognitive status: Within functional limits for tasks assessed     SENSATION: WFL  EDEMA:  Expected swelling   LOWER EXTREMITY ROM:  Active ROM Left/Right 04/27/2024   Hip flexion    Hip extension    Hip abduction    Hip adduction    Hip internal rotation    Hip external rotation    Knee flexion 129/92   Knee extension 2/7   Ankle dorsiflexion    Ankle  plantarflexion    Ankle inversion    Ankle eversion     (Blank rows = not tested)  LOWER EXTREMITY STRENGTH:  Assessed in pounds with a hand-held dynamometer Left/Right 04/27/2024   Hip flexion    Hip extension    Hip abduction    Hip adduction    Hip internal rotation    Hip external rotation    Knee flexion    Knee extension 37.6/6.9   Ankle dorsiflexion    Ankle plantarflexion    Ankle inversion    Ankle eversion     (Blank rows = not tested)  GAIT: Distance walked: 100 feet Assistive device utilized: Environmental Consultant - 2 wheeled Level of assistance: Modified independence Comments: [place] was assistive device free before surgery                                                                                                                                TREATMENT DATE: 04/27/2024 Seated TKE 10 x 5 seconds Seated knee flexion AAROM  (left leg pushes right into flexion) 10 x 5 seconds Supine quadriceps sets with a heel prop 2 sets of 10 for 5 seconds Supine  knee flexion with strap 10 x 5 seconds  Vaso: High pressure 34 degrees right knee 10 minutes   PATIENT EDUCATION:  Education details: See above Person educated: Patient and Spouse Education method: Explanation, Demonstration, Tactile cues, Verbal cues, and Handouts Education comprehension: verbalized understanding, returned demonstration, verbal cues required, tactile cues required, and needs further education  HOME EXERCISE PROGRAM: Access Code: [email or web address] URL: [email or web address] Date: 04/27/2024 Prepared by: Lamar Ivory  Exercises - Supine Quadricep Sets  - 5 x daily - 7 x weekly - 2 sets - 10 reps - 5 second hold - Seated Knee Extension AROM  - 5 x daily - 7 x weekly - 1 sets - 10 reps - 5 seconds hold - Seated Knee Flexion AAROM  - 5 x daily - 7 x weekly - 1 sets - 10 reps - 5 seconds hold - Hip Flexion  - 5 x daily - 7 x weekly - 1 sets - 10 reps - 5 seconds hold  ASSESSMENT:  CLINICAL IMPRESSION: Patient is a 73 y.o. male who was seen today for physical therapy evaluation and treatment for Z96.651 (ICD-10-CM) - Status post total right knee replacement M17.11 (ICD-10-CM) - Primary osteoarthritis of right knee.  Troye is familiar with the total knee recovery process as he had his left knee replaced in January of this year.  His left knee is doing very well as evidenced by objective measures today.  Right knee active range of motion was assessed at 0 - 7 -92 degrees.  Significant right quadriceps weakness was also noted, although this was pain limited.  Dashel is a great rehabilitation candidate and should meet long-term goals listed below.  OBJECTIVE IMPAIRMENTS: Abnormal gait, decreased activity tolerance, decreased balance, decreased coordination, decreased endurance, difficulty walking, decreased ROM,  decreased strength, increased edema, impaired perceived functional ability, and pain.   ACTIVITY LIMITATIONS: carrying, lifting, bending, standing, squatting,  sleeping, stairs, transfers, bed mobility, and locomotion level  PARTICIPATION LIMITATIONS: driving, shopping, community activity, and yard work  PERSONAL FACTORS: Atrial fibrillation, Stage 1 CKD, Type 2 DM, HLD, HTN, obesity, previous Lt TKA in January 2025 are also affecting patient's functional outcome.   REHAB POTENTIAL: Good  CLINICAL DECISION MAKING: Evolving/moderate complexity  EVALUATION COMPLEXITY: Moderate   GOALS: Goals reviewed with patient? Yes  SHORT TERM GOALS: Target date: 06/08/2024 Joeangel will be independent with his day 1 HEP Baseline: Started 04/27/2024 Goal status: INITIAL  2.  Improve right knee AROM to 0 - 3 - 105 degrees Baseline: 0 - 7 - 92 degrees Goal status: INITIAL  3.  Improve right quadriceps strength as assessed by safe transition to a single point cane Baseline: Wheeled walker Goal status: INITIAL   LONG TERM GOALS: Target date: 07/16/2023  Improve Patient Specific Functional Score to 6 Baseline: 3.2 Goal status: INITIAL  2.  Shawon will report right knee pain consistently 3/10 or less on the Visual Analog Scale Baseline: 3-5/10 Goal status: INITIAL  3.  Improve knee AROM to 0 - 3 - 110 degrees or better Baseline: 0 - 7 -92 degrees Goal status: INITIAL  4.  Improve bilateral quadriceps strength to at least 40 pounds Baseline: 6.9 pounds on the right Goal status: INITIAL  5.  Alfonzia will be independent with his long-term maintenance HEP at [place] Baseline: Started 11/18/205 Goal status: INITIAL   PLAN:  PT FREQUENCY: 2-3 times a week  PT DURATION: 12 weeks  PLANNED INTERVENTIONS: 97750- Physical Performance Testing, 97110-Therapeutic exercises, 97530- Therapeutic activity, 97112- Neuromuscular re-education, 97535- Self Care, [id number]- Manual therapy, [phone]- Gait training, [phone]- Vasopneumatic device, Patient/Family education, Balance training, Stair training, Joint mobilization, Cryotherapy, and Moist heat  PLAN FOR NEXT SESSION:  Typical post-TKA rehabilitation with early emphasis on AROM (flexion and extension), quadriceps strength and edema control.  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   Myer LELON Ivory, PT, MPT 04/27/2024, 4:57 PM

## 2024-04-27 NOTE — Progress Notes (Signed)
 Post-Op Visit Note   Patient: James Robbins.           Date of Birth: [birth year]           MRN: [id number] Visit Date: 04/27/2024 PCP: Rilla Baller, MD   Assessment & Plan:  Chief Complaint:  Chief Complaint  Patient presents with   Right Knee - Routine Post Op    R TKA-04/12/2024   Visit Diagnoses:  1. Status post total right knee replacement     Plan: Patient is a pleasant 73 year old gentleman who comes in today 2 weeks status post right total knee replacement.  He has been doing much better this go around.  He has been taking Percocet and Robaxin  for pain.  He does take Eliquis  for chronic A-fib.  He has been getting home health physical therapy and is ambulating with a walker.  He is scheduled start outpatient physical therapy at our office this afternoon.  Examination of the right knee reveals a well-healed surgical incision with nylon sutures in place.  No evidence of infection or cellulitis.  Calf soft nontender.  He is neurovasc intact distally.  Today, sutures removed and Steri-Strips applied.  Continue with PT for which he starts this afternoon.  Follow-up in 4 weeks for repeat evaluation and 2 view x-rays of the right knee.  Call with concerns or questions.  Follow-Up Instructions: Return in about 4 weeks (around 05/25/2024) for with xu.   Orders:  No orders of the defined types were placed in this encounter.  No orders of the defined types were placed in this encounter.   Imaging: No new imaging  PMFS History: Patient Active Problem List   Diagnosis Date Noted   Status post total right knee replacement 04/12/2024   Primary osteoarthritis of right knee 04/11/2024   Skin rash 03/19/2024   Right kidney mass 03/19/2024   Multiple acquired cysts of kidney 03/19/2024   Diabetic nephropathy with proteinuria (HCC) 12/16/2023   Status post total left knee replacement 06/16/2023   Hypercoagulable state due to permanent atrial fibrillation (HCC) 01/01/2023    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) 01/01/2022   Hypertensive retinopathy of both eyes 01/01/2022   Vision loss of right eye 09/06/2021   Medicare annual wellness visit, subsequent 09/05/2021   Right retinal artery branch occlusion 04/05/2021   Obstructive sleep apnea hypopnea, severe 11/07/2020   Permanent atrial fibrillation (HCC) 11/07/2020   Primary osteoarthritis of knees, bilateral 04/23/2019   Vitamin D  deficiency 09/23/2016   Advanced care planning/counseling discussion 07/20/2015   Encounter for general adult medical examination with abnormal findings 02/21/2014   Severe obesity (BMI 35.0-39.9) with comorbidity (HCC)    Type 2 diabetes mellitus with persistent proteinuria (HCC)    Seasonal allergic rhinitis 12/24/2011   Hypertension 12/24/2011   Hyperlipidemia associated with type 2 diabetes mellitus (HCC) 12/24/2011   Past Medical History:  Diagnosis Date   Arthritis    Atrial fibrillation (HCC)    Chronic kidney disease (CKD), stage I    proteinuria   Controlled type 2 diabetes mellitus with diabetic nephropathy (HCC)    Completed DSME 2017   Dysrhythmia    A. Fib   Hematuria 2014   s/p normal urological workup ([place])   Hyperlipidemia    Hypertension    Obesity    Seasonal allergic rhinitis    Sleep apnea    has cpap machine; does not use    Family History  Problem Relation Age of Onset  Diabetes Mother    Hypertension Mother    Hyperlipidemia Mother    CAD Father        1v CABG, smoker   Osteoporosis Sister    Cancer Neg Hx     Past Surgical History:  Procedure Laterality Date   CARDIOVERSION N/A 11/27/2020   Procedure: CARDIOVERSION;  Surgeon: Nahser, Aleene PARAS, MD;  Location: [facility] ENDOSCOPY;  Service: Cardiovascular;  Laterality: N/A;   CATARACT EXTRACTION Bilateral 08/2021   COLONOSCOPY  06/10/2004   per pt normal ([place])   TOTAL KNEE ARTHROPLASTY Left 06/16/2023   Procedure: LEFT TOTAL KNEE ARTHROPLASTY;  Surgeon: Jerri Kay HERO, MD;  Location: [facility] OR;  Service: Orthopedics;  Laterality: Left;   TOTAL KNEE ARTHROPLASTY Right 04/12/2024   Procedure: ARTHROPLASTY, KNEE, TOTAL;  Surgeon: Jerri Kay HERO, MD;  Location: [facility] OR;  Service: Orthopedics;  Laterality: Right;   VASECTOMY     VASECTOMY REVERSAL     Social History   Occupational History   Occupation: retired    Associate Professor: UNEMPLOYED    Comment: 2006  Tobacco Use   Smoking status: Former    Current packs/day: 0.00    Types: Cigarettes    Quit date: 06/09/1986    Years since quitting: 37.9   Smokeless tobacco: Never  Vaping Use   Vaping status: Never Used  Substance and Sexual Activity   Alcohol use: No    Alcohol/week: 0.0 standard drinks of alcohol   Drug use: No   Sexual activity: Not Currently

## 2024-04-28 ENCOUNTER — Encounter: Payer: Self-pay | Admitting: Rehabilitative and Restorative Service Providers"

## 2024-04-28 ENCOUNTER — Ambulatory Visit: Admitting: Rehabilitative and Restorative Service Providers"

## 2024-04-28 DIAGNOSIS — R262 Difficulty in walking, not elsewhere classified: Secondary | ICD-10-CM | POA: Diagnosis not present

## 2024-04-28 DIAGNOSIS — M6281 Muscle weakness (generalized): Secondary | ICD-10-CM

## 2024-04-28 DIAGNOSIS — M25561 Pain in right knee: Secondary | ICD-10-CM

## 2024-04-28 DIAGNOSIS — R600 Localized edema: Secondary | ICD-10-CM | POA: Diagnosis not present

## 2024-04-28 NOTE — Therapy (Signed)
 OUTPATIENT PHYSICAL THERAPY TREATMENT   Patient Name: James Robbins. MRN: [id number] DOB:1950-09-05, 73 y.o., male Today's Date: 04/28/2024  END OF SESSION:  PT End of Session - 04/28/24 1548     Visit Number 2    Number of Visits 18    Date for Recertification  07/20/24    Authorization Type [employer] $25 copay    Authorization - Visit Number 2    Progress Note Due on Visit 10    PT Start Time 1553    PT Stop Time 1636    PT Time Calculation (min) 43 min    Activity Tolerance Patient tolerated treatment well    Behavior During Therapy WFL for tasks assessed/performed           Past Medical History:  Diagnosis Date   Arthritis    Atrial fibrillation (HCC)    Chronic kidney disease (CKD), stage I    proteinuria   Controlled type 2 diabetes mellitus with diabetic nephropathy (HCC)    Completed DSME 2017   Dysrhythmia    A. Fib   Hematuria 2014   s/p normal urological workup ([place])   Hyperlipidemia    Hypertension    Obesity    Seasonal allergic rhinitis    Sleep apnea    has cpap machine; does not use   Past Surgical History:  Procedure Laterality Date   CARDIOVERSION N/A 11/27/2020   Procedure: CARDIOVERSION;  Surgeon: Nahser, Aleene PARAS, MD;  Location: [facility] ENDOSCOPY;  Service: Cardiovascular;  Laterality: N/A;   CATARACT EXTRACTION Bilateral 08/2021   COLONOSCOPY  06/10/2004   per pt normal ([place])   TOTAL KNEE ARTHROPLASTY Left 06/16/2023   Procedure: LEFT TOTAL KNEE ARTHROPLASTY;  Surgeon: Jerri Kay HERO, MD;  Location: [facility] OR;  Service: Orthopedics;  Laterality: Left;   TOTAL KNEE ARTHROPLASTY Right 04/12/2024   Procedure: ARTHROPLASTY, KNEE, TOTAL;  Surgeon: Jerri Kay HERO, MD;  Location: [facility] OR;  Service: Orthopedics;  Laterality: Right;   VASECTOMY     VASECTOMY REVERSAL     Patient Active Problem List   Diagnosis Date Noted   Status post total right knee replacement 04/12/2024   Primary osteoarthritis of right knee 04/11/2024   Skin rash 03/19/2024    Right kidney mass 03/19/2024   Multiple acquired cysts of kidney 03/19/2024   Diabetic nephropathy with proteinuria (HCC) 12/16/2023   Status post total left knee replacement 06/16/2023   Hypercoagulable state due to permanent atrial fibrillation (HCC) 01/01/2023   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) 01/01/2022   Hypertensive retinopathy of both eyes 01/01/2022   Vision loss of right eye 09/06/2021   Medicare annual wellness visit, subsequent 09/05/2021   Right retinal artery branch occlusion 04/05/2021   Obstructive sleep apnea hypopnea, severe 11/07/2020   Permanent atrial fibrillation (HCC) 11/07/2020   Primary osteoarthritis of knees, bilateral 04/23/2019   Vitamin D  deficiency 09/23/2016   Advanced care planning/counseling discussion 07/20/2015   Encounter for general adult medical examination with abnormal findings 02/21/2014   Severe obesity (BMI 35.0-39.9) with comorbidity (HCC)    Type 2 diabetes mellitus with persistent proteinuria (HCC)    Seasonal allergic rhinitis 12/24/2011   Hypertension 12/24/2011   Hyperlipidemia associated with type 2 diabetes mellitus (HCC) 12/24/2011    PCP: Anton Blas, MD  REFERRING PROVIDER: Kay HERO Jerri, MD  REFERRING DIAG: [phone] (ICD-10-CM) - Status post total right knee replacement M17.11 (ICD-10-CM) - Primary osteoarthritis of right knee  THERAPY DIAG:  Difficulty in walking, not elsewhere  classified  Muscle weakness (generalized)  Localized edema  Acute pain of right knee  Rationale for Evaluation and Treatment: Rehabilitation  ONSET DATE: 04/12/2024 Rt TKA  SUBJECTIVE:   SUBJECTIVE STATEMENT: Pt indicated about a 4/10 today.  Reported feeling pretty good and did a little more activity and got a little more sore.   PERTINENT HISTORY: Atrial fibrillation, Stage 1 CKD, Type 2 DM, HLD, HTN, obesity, previous Lt TKA in January 2025   PAIN:  NPRS scale: 4/10.  Pain location: Rt knee Pain  description: Achy Aggravating factors: Prolonged postures Relieving factors: Ice, tylenol , oxycodone  and a muscle relaxer  PRECAUTIONS: Other: See past medical history  RED FLAGS: None   WEIGHT BEARING RESTRICTIONS: No  FALLS:  Has patient fallen in last 6 months? No  LIVING ENVIRONMENT: Lives with: lives with their family and lives with their spouse Lives in: House/apartment Stairs: Manages with a handrail and some effort Has following equipment at home: Environmental Consultant - 2 wheeled  OCCUPATION: Retired  PLOF: Independent  PATIENT GOALS: Needs to get up and down 7 stairs now, will need to do more, 5 acres of yard work  NEXT MD VISIT: Today   OBJECTIVE:  Note: Objective measures were completed at Evaluation unless otherwise noted.  DIAGNOSTIC FINDINGS: IMPRESSION: 1. Immediate postoperative right total knee arthroplasty with components in anatomic alignment and no periprosthetic fracture or dislocation.  PATIENT SURVEYS:  PSFS: THE PATIENT SPECIFIC FUNCTIONAL SCALE  Place score of 0-10 (0 = unable to perform activity and 10 = able to perform activity at the same level as before injury or problem)  Activity Date: 04/27/2024    Walking 4    2.  Working in the yard 3    3.  Taking out the trash 3    4.  Shopping 3    5.  Weed eating 3    Total Score 3.2      Total Score = Sum of activity scores/number of activities  Minimally Detectable Change: 3 points (for single activity); 2 points (for average score)  [facility] (nd). The Patient Specific Functional Scale . Retrieved from [employer].com.pt   COGNITION: 04/27/2024 Overall cognitive status: Within functional limits for tasks assessed     SENSATION: 04/27/2024 [facility]  EDEMA:  04/27/2024 Expected swelling   LOWER EXTREMITY ROM:  ROM Right 04/27/2024 AROM Left 04/27/2024 AROM  Hip flexion    Hip extension    Hip abduction    Hip adduction     Hip internal rotation    Hip external rotation    Knee flexion 92 129  Knee extension - 7 - 2  Ankle dorsiflexion    Ankle plantarflexion    Ankle inversion    Ankle eversion     (Blank rows = not tested)  LOWER EXTREMITY STRENGTH:   Right 04/27/2024 Left 04/27/2024  Hip flexion    Hip extension    Hip abduction    Hip adduction    Hip internal rotation    Hip external rotation    Knee flexion    Knee extension 6.9 lbs 37.6 lbs  Ankle dorsiflexion    Ankle plantarflexion    Ankle inversion    Ankle eversion     (Blank rows = not tested)  GAIT: 04/27/2024 Distance walked: 100 feet Assistive device utilized: Environmental Consultant - 2 wheeled Level of assistance: Modified independence Comments: James Robbins was assistive device free before surgery  TREATMENT      DATE: 04/28/2024 Therex: Nustep Lvl 6 UE/LE for knee ROM 8 mins.  Seated alternating Rt knee extension/flexion isometric holds in 70 deg knee flexion 5 sec each way x 7 Seated Rt LAQ with end range pauses each direction 2 x 10  Supine heel slide with quad set combo Rt leg 5 sec hold each direction x 10   Supine Rt knee heel prop TKE stretch to tolerance (3 mins) to start vaso (cues for home use routinely during day to work on extension).  Education and cues for routine exercise use in small amounts but frequently during day to help with mobility and stiffness complaints.  Education performed during vaso time.   Manual Seated Rt knee flexion with distraction/IR mobilization with movement.  Extension stretching with ER tibia.   Vaso Rt knee elevated high pressure 34 degrees 10 mins with TKE stretch to start.     TREATMENT      DATE: 04/27/2024 Seated TKE 10 x 5 seconds Seated knee flexion AAROM  (left leg pushes right into flexion) 10 x 5 seconds Supine quadriceps sets with a heel  prop 2 sets of 10 for 5 seconds Supine knee flexion with strap 10 x 5 seconds  Vaso: High pressure 34 degrees right knee 10 minutes   PATIENT EDUCATION:  04/27/2024 Education details: See above Person educated: Patient and Spouse Education method: Explanation, Demonstration, Tactile cues, Verbal cues, and Handouts Education comprehension: verbalized understanding, returned demonstration, verbal cues required, tactile cues required, and needs further education  HOME EXERCISE PROGRAM: Access Code: [email or web address] URL: [email or web address] Date: 04/27/2024 Prepared by: Lamar Ivory  Exercises - Supine Quadricep Sets  - 5 x daily - 7 x weekly - 2 sets - 10 reps - 5 second hold - Seated Knee Extension AROM  - 5 x daily - 7 x weekly - 1 sets - 10 reps - 5 seconds hold - Seated Knee Flexion AAROM  - 5 x daily - 7 x weekly - 1 sets - 10 reps - 5 seconds hold - Hip Flexion  - 5 x daily - 7 x weekly - 1 sets - 10 reps - 5 seconds hold  ASSESSMENT:  CLINICAL IMPRESSION: Pt to continue to benefit from skilled PT services for mobility, strength and balance improvements.  Continued ambulation with FWW.  Extension mobility felt firmer in restriction than flexion mobility.  Discussed HEP routine use for mobility gains.   OBJECTIVE IMPAIRMENTS: Abnormal gait, decreased activity tolerance, decreased balance, decreased coordination, decreased endurance, difficulty walking, decreased ROM, decreased strength, increased edema, impaired perceived functional ability, and pain.   ACTIVITY LIMITATIONS: carrying, lifting, bending, standing, squatting, sleeping, stairs, transfers, bed mobility, and locomotion level  PARTICIPATION LIMITATIONS: driving, shopping, community activity, and yard work  PERSONAL FACTORS: Atrial fibrillation, Stage 1 CKD, Type 2 DM, HLD, HTN, obesity, previous Lt TKA in January 2025 are also affecting patient's functional outcome.   REHAB POTENTIAL: Good  CLINICAL  DECISION MAKING: Evolving/moderate complexity  EVALUATION COMPLEXITY: Moderate   GOALS: Goals reviewed with patient? Yes  SHORT TERM GOALS: Target date: 06/08/2024 James Robbins will be independent with his day 1 HEP Baseline: Started 04/27/2024 Goal status: on going 04/28/2024  2.  Improve right knee AROM to 0 - 3 - 105 degrees Baseline: 0 - 7 - 92 degrees Goal status: on going 04/28/2024  3.  Improve right quadriceps strength as assessed by safe transition to a single point cane Baseline: Wheeled walker Goal status: on going 04/28/2024  LONG TERM GOALS: Target date: 07/16/2023  Improve Patient Specific Functional Score to 6 Baseline: 3.2 Goal status: INITIAL  2.  James Robbins will report right knee pain consistently 3/10 or less on the Visual Analog Scale Baseline: 3-5/10 Goal status: INITIAL  3.  Improve knee AROM to 0 - 3 - 110 degrees or better Baseline: 0 - 7 -92 degrees Goal status: INITIAL  4.  Improve bilateral quadriceps strength to at least 40 pounds Baseline: 6.9 pounds on the right Goal status: INITIAL  5.  James Robbins will be independent with his long-term maintenance HEP at [place] Baseline: Started 11/18/205 Goal status: INITIAL   PLAN:  PT FREQUENCY: 2-3 times a week  PT DURATION: 12 weeks  PLANNED INTERVENTIONS: 97750- Physical Performance Testing, 97110-Therapeutic exercises, 97530- Therapeutic activity, 97112- Neuromuscular re-education, 97535- Self Care, 02859- Manual therapy, [phone]- Gait training, [phone]- Vasopneumatic device, Patient/Family education, Balance training, Stair training, Joint mobilization, Cryotherapy, and Moist heat  PLAN FOR NEXT SESSION:  mobility gains, quad strengthening, WB improvement for walking progressions.    James Robbins, PT, DPT, OCS, ATC 04/28/24  4:25 PM

## 2024-04-29 ENCOUNTER — Encounter: Admitting: Rehabilitative and Restorative Service Providers"

## 2024-04-30 ENCOUNTER — Telehealth: Payer: Self-pay | Admitting: Orthopaedic Surgery

## 2024-04-30 NOTE — Therapy (Signed)
 OUTPATIENT PHYSICAL THERAPY TREATMENT   Patient Name: James Robbins. MRN: [id number] DOB:07/24/1950, 73 y.o., male Today's Date: 05/03/2024  END OF SESSION:  PT End of Session - 05/03/24 1343     Visit Number 3    Number of Visits 18    Date for Recertification  07/20/24    Authorization Type [employer] $25 copay    Authorization - Visit Number 3    Progress Note Due on Visit 10    PT Start Time 1345    PT Stop Time 1437    PT Time Calculation (min) 52 min    Activity Tolerance Patient tolerated treatment well    Behavior During Therapy WFL for tasks assessed/performed            Past Medical History:  Diagnosis Date   Arthritis    Atrial fibrillation (HCC)    Chronic kidney disease (CKD), stage I    proteinuria   Controlled type 2 diabetes mellitus with diabetic nephropathy (HCC)    Completed DSME 2017   Dysrhythmia    A. Fib   Hematuria 2014   s/p normal urological workup ([place])   Hyperlipidemia    Hypertension    Obesity    Seasonal allergic rhinitis    Sleep apnea    has cpap machine; does not use   Past Surgical History:  Procedure Laterality Date   CARDIOVERSION N/A 11/27/2020   Procedure: CARDIOVERSION;  Surgeon: Nahser, Aleene PARAS, MD;  Location: [facility] ENDOSCOPY;  Service: Cardiovascular;  Laterality: N/A;   CATARACT EXTRACTION Bilateral 08/2021   COLONOSCOPY  06/10/2004   per pt normal ([place])   TOTAL KNEE ARTHROPLASTY Left 06/16/2023   Procedure: LEFT TOTAL KNEE ARTHROPLASTY;  Surgeon: Jerri Kay HERO, MD;  Location: [facility] OR;  Service: Orthopedics;  Laterality: Left;   TOTAL KNEE ARTHROPLASTY Right 04/12/2024   Procedure: ARTHROPLASTY, KNEE, TOTAL;  Surgeon: Jerri Kay HERO, MD;  Location: [facility] OR;  Service: Orthopedics;  Laterality: Right;   VASECTOMY     VASECTOMY REVERSAL     Patient Active Problem List   Diagnosis Date Noted   Status post total right knee replacement 04/12/2024   Primary osteoarthritis of right knee 04/11/2024   Skin rash 03/19/2024    Right kidney mass 03/19/2024   Multiple acquired cysts of kidney 03/19/2024   Diabetic nephropathy with proteinuria (HCC) 12/16/2023   Status post total left knee replacement 06/16/2023   Hypercoagulable state due to permanent atrial fibrillation (HCC) 01/01/2023   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) 01/01/2022   Hypertensive retinopathy of both eyes 01/01/2022   Vision loss of right eye 09/06/2021   Medicare annual wellness visit, subsequent 09/05/2021   Right retinal artery branch occlusion 04/05/2021   Obstructive sleep apnea hypopnea, severe 11/07/2020   Permanent atrial fibrillation (HCC) 11/07/2020   Primary osteoarthritis of knees, bilateral 04/23/2019   Vitamin D  deficiency 09/23/2016   Advanced care planning/counseling discussion 07/20/2015   Encounter for general adult medical examination with abnormal findings 02/21/2014   Severe obesity (BMI 35.0-39.9) with comorbidity (HCC)    Type 2 diabetes mellitus with persistent proteinuria (HCC)    Seasonal allergic rhinitis 12/24/2011   Hypertension 12/24/2011   Hyperlipidemia associated with type 2 diabetes mellitus (HCC) 12/24/2011    PCP: Anton Blas, MD  REFERRING PROVIDER: Kay HERO Jerri, MD  REFERRING DIAG: [phone] (ICD-10-CM) - Status post total right knee replacement M17.11 (ICD-10-CM) - Primary osteoarthritis of right knee  THERAPY DIAG:  Acute pain of right  knee  Difficulty in walking, not elsewhere classified  Muscle weakness (generalized)  Localized edema  Stiffness of right knee, not elsewhere classified  Rationale for Evaluation and Treatment: Rehabilitation  ONSET DATE: 04/12/2024 Rt TKA  SUBJECTIVE:   SUBJECTIVE STATEMENT: Patient reports having increased edema and stiffness following increased physical activity this morning, though doing okay secondary to tylenol .    PERTINENT HISTORY: Atrial fibrillation, Stage 1 CKD, Type 2 DM, HLD, HTN, obesity, previous Lt TKA in  January 2025   PAIN:  NPRS scale: 5/10.  Pain location: Rt knee Pain description: Achy Aggravating factors: Prolonged postures Relieving factors: Ice, tylenol , oxycodone  and a muscle relaxer  PRECAUTIONS: Other: See past medical history  RED FLAGS: None   WEIGHT BEARING RESTRICTIONS: No  FALLS:  Has patient fallen in last 6 months? No  LIVING ENVIRONMENT: Lives with: lives with their family and lives with their spouse Lives in: House/apartment Stairs: Manages with a handrail and some effort Has following equipment at home: Environmental Consultant - 2 wheeled  OCCUPATION: Retired  PLOF: Independent  PATIENT GOALS: Needs to get up and down 7 stairs now, will need to do more, 5 acres of yard work  NEXT MD VISIT: Today   OBJECTIVE:  Note: Objective measures were completed at Evaluation unless otherwise noted.  DIAGNOSTIC FINDINGS: IMPRESSION: 1. Immediate postoperative right total knee arthroplasty with components in anatomic alignment and no periprosthetic fracture or dislocation.  PATIENT SURVEYS:  PSFS: THE PATIENT SPECIFIC FUNCTIONAL SCALE  Place score of 0-10 (0 = unable to perform activity and 10 = able to perform activity at the same level as before injury or problem)  Activity Date: 04/27/2024    Walking 4    2.  Working in the yard 3    3.  Taking out the trash 3    4.  Shopping 3    5.  Weed eating 3    Total Score 3.2      Total Score = Sum of activity scores/number of activities  Minimally Detectable Change: 3 points (for single activity); 2 points (for average score)  [facility] (nd). The Patient Specific Functional Scale . Retrieved from [email or web address].com.pt   COGNITION: 04/27/2024 Overall cognitive status: Within functional limits for tasks assessed     SENSATION: 04/27/2024 [facility]  EDEMA:  04/27/2024 Expected swelling   LOWER EXTREMITY ROM:  ROM Right 04/27/2024 AROM  Left 04/27/2024 AROM  Hip flexion    Hip extension    Hip abduction    Hip adduction    Hip internal rotation    Hip external rotation    Knee flexion 92 129  Knee extension - 7 - 2  Ankle dorsiflexion    Ankle plantarflexion    Ankle inversion    Ankle eversion     (Blank rows = not tested)  LOWER EXTREMITY STRENGTH:   Right 04/27/2024 Left 04/27/2024  Hip flexion    Hip extension    Hip abduction    Hip adduction    Hip internal rotation    Hip external rotation    Knee flexion    Knee extension 6.9 lbs 37.6 lbs  Ankle dorsiflexion    Ankle plantarflexion    Ankle inversion    Ankle eversion     (Blank rows = not tested)  GAIT: 04/27/2024 Distance walked: 100 feet Assistive device utilized: Environmental Consultant - 2 wheeled Level of assistance: Modified independence Comments: Tramond was assistive device free before surgery  TREATMENT      DATE: 05/03/2024 TherEx:  Nustep level 4 with bilat UE and LE for 8 minutes  Slant board gastroc stretch 3x30s  Seated knee extension with reciprocal movement with contralat LE 1x10 with 5s holds each direction  Seated SLR 1x10; highly difficult secondary to appropriate form and lack of knee extension ROM/strength  Supine hamstring stretch with PT assistance 2x30s on Rt   Manual:  Seated Rt knee flexion with distraction/IR 1x10 with 10s holds and PT providing PROM knee extension between   Vaso:  Rt knee elevated on wedge with medium compression for 10 minutes at 34deg     TREATMENT      DATE: 04/28/2024 Therex: Nustep Lvl 6 UE/LE for knee ROM 8 mins.  Seated alternating Rt knee extension/flexion isometric holds in 70 deg knee flexion 5 sec each way x 7 Seated Rt LAQ with end range pauses each direction 2 x 10  Supine heel slide with quad set combo Rt leg 5 sec hold each direction x 10    Supine Rt knee heel prop TKE stretch to tolerance (3 mins) to start vaso (cues for home use routinely during day to work on extension).  Education and cues for routine exercise use in small amounts but frequently during day to help with mobility and stiffness complaints.  Education performed during vaso time.   Manual Seated Rt knee flexion with distraction/IR mobilization with movement.  Extension stretching with ER tibia.   Vaso Rt knee elevated high pressure 34 degrees 10 mins with TKE stretch to start.     TREATMENT      DATE: 04/27/2024 Seated TKE 10 x 5 seconds Seated knee flexion AAROM  (left leg pushes right into flexion) 10 x 5 seconds Supine quadriceps sets with a heel prop 2 sets of 10 for 5 seconds Supine knee flexion with strap 10 x 5 seconds  Vaso: High pressure 34 degrees right knee 10 minutes   PATIENT EDUCATION:  04/27/2024 Education details: See above Person educated: Patient and Spouse Education method: Explanation, Demonstration, Tactile cues, Verbal cues, and Handouts Education comprehension: verbalized understanding, returned demonstration, verbal cues required, tactile cues required, and needs further education  HOME EXERCISE PROGRAM: Access Code: [email or web address] URL: [email or web address] Date: 04/27/2024 Prepared by: Lamar Ivory  Exercises - Supine Quadricep Sets  - 5 x daily - 7 x weekly - 2 sets - 10 reps - 5 second hold - Seated Knee Extension AROM  - 5 x daily - 7 x weekly - 1 sets - 10 reps - 5 seconds hold - Seated Knee Flexion AAROM  - 5 x daily - 7 x weekly - 1 sets - 10 reps - 5 seconds hold - Hip Flexion  - 5 x daily - 7 x weekly - 1 sets - 10 reps - 5 seconds hold  ASSESSMENT:  CLINICAL IMPRESSION: Patient reports increased edema and soreness this session. Patient tolerated all activities this date with increased difficulty performing knee extension compared to flexion. Patient will continue to benefit from skilled  PT.  OBJECTIVE IMPAIRMENTS: Abnormal gait, decreased activity tolerance, decreased balance, decreased coordination, decreased endurance, difficulty walking, decreased ROM, decreased strength, increased edema, impaired perceived functional ability, and pain.   ACTIVITY LIMITATIONS: carrying, lifting, bending, standing, squatting, sleeping, stairs, transfers, bed mobility, and locomotion level  PARTICIPATION LIMITATIONS: driving, shopping, community activity, and yard work  PERSONAL FACTORS: Atrial fibrillation, Stage 1 CKD, Type 2 DM, HLD, HTN, obesity, previous Lt TKA in January 2025 are also affecting  patient's functional outcome.   REHAB POTENTIAL: Good  CLINICAL DECISION MAKING: Evolving/moderate complexity  EVALUATION COMPLEXITY: Moderate   GOALS: Goals reviewed with patient? Yes  SHORT TERM GOALS: Target date: 06/08/2024 Lavern will be independent with his day 1 HEP Baseline: Started 04/27/2024 Goal status: on going 04/28/2024  2.  Improve right knee AROM to 0 - 3 - 105 degrees Baseline: 0 - 7 - 92 degrees Goal status: on going 04/28/2024  3.  Improve right quadriceps strength as assessed by safe transition to a single point cane Baseline: Wheeled walker Goal status: on going 04/28/2024   LONG TERM GOALS: Target date: 07/16/2023  Improve Patient Specific Functional Score to 6 Baseline: 3.2 Goal status: INITIAL  2.  Horton will report right knee pain consistently 3/10 or less on the Visual Analog Scale Baseline: 3-5/10 Goal status: INITIAL  3.  Improve knee AROM to 0 - 3 - 110 degrees or better Baseline: 0 - 7 -92 degrees Goal status: INITIAL  4.  Improve bilateral quadriceps strength to at least 40 pounds Baseline: 6.9 pounds on the right Goal status: INITIAL  5.  Shi will be independent with his long-term maintenance HEP at [place] Baseline: Started 11/18/205 Goal status: INITIAL   PLAN:  PT FREQUENCY: 2-3 times a week  PT DURATION: 12 weeks  PLANNED  INTERVENTIONS: 97750- Physical Performance Testing, 97110-Therapeutic exercises, 97530- Therapeutic activity, 97112- Neuromuscular re-education, 97535- Self Care, 02859- Manual therapy, [phone]- Gait training, [phone]- Vasopneumatic device, Patient/Family education, Balance training, Stair training, Joint mobilization, Cryotherapy, and Moist heat  PLAN FOR NEXT SESSION:   mobility gains, quad strengthening, WB improvement for walking progressions.    Susannah Daring, PT, DPT 05/03/24 3:35 PM

## 2024-04-30 NOTE — Telephone Encounter (Signed)
 Received call from patients wife, Ulla. Regarding FMLA forms, she states her employer is interpreting the form to be continous. I advised patient it is completed with 10 days continous then is completed for intermittent leave. She asked that it be faxed again stressing the intermittent leave part of the form. And email her a copy. I refaxed form [phone] and emailed [email or web address]

## 2024-05-03 ENCOUNTER — Telehealth: Payer: Self-pay | Admitting: Orthopaedic Surgery

## 2024-05-03 ENCOUNTER — Ambulatory Visit

## 2024-05-03 DIAGNOSIS — M25661 Stiffness of right knee, not elsewhere classified: Secondary | ICD-10-CM

## 2024-05-03 DIAGNOSIS — M25561 Pain in right knee: Secondary | ICD-10-CM

## 2024-05-03 DIAGNOSIS — R600 Localized edema: Secondary | ICD-10-CM | POA: Diagnosis not present

## 2024-05-03 DIAGNOSIS — R262 Difficulty in walking, not elsewhere classified: Secondary | ICD-10-CM

## 2024-05-03 DIAGNOSIS — M6281 Muscle weakness (generalized): Secondary | ICD-10-CM | POA: Diagnosis not present

## 2024-05-03 MED ORDER — HYDROCODONE-ACETAMINOPHEN 5-325 MG PO TABS: 1.0000 | ORAL_TABLET | Freq: Every day | ORAL | 0 refills | Status: DC | PRN

## 2024-05-03 NOTE — Telephone Encounter (Signed)
 Done

## 2024-05-03 NOTE — Telephone Encounter (Signed)
 Patient is here for PT. He will be out of his pain medication and muscle relaxer by the weekend. Would like a refill.

## 2024-05-04 NOTE — Telephone Encounter (Signed)
 Faxing [facility] pt and portion ,have not received decision on pap.

## 2024-05-11 ENCOUNTER — Ambulatory Visit: Admitting: Physical Therapy

## 2024-05-11 ENCOUNTER — Encounter: Payer: Self-pay | Admitting: Physical Therapy

## 2024-05-11 DIAGNOSIS — M25661 Stiffness of right knee, not elsewhere classified: Secondary | ICD-10-CM | POA: Diagnosis not present

## 2024-05-11 DIAGNOSIS — M6281 Muscle weakness (generalized): Secondary | ICD-10-CM

## 2024-05-11 DIAGNOSIS — R262 Difficulty in walking, not elsewhere classified: Secondary | ICD-10-CM

## 2024-05-11 DIAGNOSIS — M25561 Pain in right knee: Secondary | ICD-10-CM | POA: Diagnosis not present

## 2024-05-11 DIAGNOSIS — R600 Localized edema: Secondary | ICD-10-CM | POA: Diagnosis not present

## 2024-05-11 NOTE — Therapy (Signed)
 OUTPATIENT PHYSICAL THERAPY TREATMENT   Patient Name: James Robbins. MRN: [id number] DOB:30-Jul-1950, 73 y.o., male Today's Date: 05/11/2024  END OF SESSION:  PT End of Session - 05/11/24 0804     Visit Number 4    Number of Visits 18    Date for Recertification  07/20/24    Authorization Type [employer] $25 copay    Authorization - Visit Number 4    Progress Note Due on Visit 10    PT Start Time 0803    PT Stop Time 0855    PT Time Calculation (min) 52 min    Activity Tolerance Patient tolerated treatment well    Behavior During Therapy WFL for tasks assessed/performed             Past Medical History:  Diagnosis Date   Arthritis    Atrial fibrillation (HCC)    Chronic kidney disease (CKD), stage I    proteinuria   Controlled type 2 diabetes mellitus with diabetic nephropathy (HCC)    Completed DSME 2017   Dysrhythmia    A. Fib   Hematuria 2014   s/p normal urological workup ([place])   Hyperlipidemia    Hypertension    Obesity    Seasonal allergic rhinitis    Sleep apnea    has cpap machine; does not use   Past Surgical History:  Procedure Laterality Date   CARDIOVERSION N/A 11/27/2020   Procedure: CARDIOVERSION;  Surgeon: Nahser, Aleene PARAS, MD;  Location: [facility] ENDOSCOPY;  Service: Cardiovascular;  Laterality: N/A;   CATARACT EXTRACTION Bilateral 08/2021   COLONOSCOPY  06/10/2004   per pt normal ([place])   TOTAL KNEE ARTHROPLASTY Left 06/16/2023   Procedure: LEFT TOTAL KNEE ARTHROPLASTY;  Surgeon: Jerri Kay HERO, MD;  Location: [facility] OR;  Service: Orthopedics;  Laterality: Left;   TOTAL KNEE ARTHROPLASTY Right 04/12/2024   Procedure: ARTHROPLASTY, KNEE, TOTAL;  Surgeon: Jerri Kay HERO, MD;  Location: [facility] OR;  Service: Orthopedics;  Laterality: Right;   VASECTOMY     VASECTOMY REVERSAL     Patient Active Problem List   Diagnosis Date Noted   Status post total right knee replacement 04/12/2024   Primary osteoarthritis of right knee 04/11/2024   Skin rash 03/19/2024    Right kidney mass 03/19/2024   Multiple acquired cysts of kidney 03/19/2024   Diabetic nephropathy with proteinuria (HCC) 12/16/2023   Status post total left knee replacement 06/16/2023   Hypercoagulable state due to permanent atrial fibrillation (HCC) 01/01/2023   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) 01/01/2022   Hypertensive retinopathy of both eyes 01/01/2022   Vision loss of right eye 09/06/2021   Medicare annual wellness visit, subsequent 09/05/2021   Right retinal artery branch occlusion 04/05/2021   Obstructive sleep apnea hypopnea, severe 11/07/2020   Permanent atrial fibrillation (HCC) 11/07/2020   Primary osteoarthritis of knees, bilateral 04/23/2019   Vitamin D  deficiency 09/23/2016   Advanced care planning/counseling discussion 07/20/2015   Encounter for general adult medical examination with abnormal findings 02/21/2014   Severe obesity (BMI 35.0-39.9) with comorbidity (HCC)    Type 2 diabetes mellitus with persistent proteinuria (HCC)    Seasonal allergic rhinitis 12/24/2011   Hypertension 12/24/2011   Hyperlipidemia associated with type 2 diabetes mellitus (HCC) 12/24/2011    PCP: Anton Blas, MD  REFERRING PROVIDER: Kay HERO Jerri, MD  REFERRING DIAG: [phone] (ICD-10-CM) - Status post total right knee replacement M17.11 (ICD-10-CM) - Primary osteoarthritis of right knee  THERAPY DIAG:  Difficulty in walking,  not elsewhere classified  Muscle weakness (generalized)  Localized edema  Acute pain of right knee  Stiffness of right knee, not elsewhere classified  Rationale for Evaluation and Treatment: Rehabilitation  ONSET DATE: 04/12/2024 Rt TKA  SUBJECTIVE:   SUBJECTIVE STATEMENT: He has been doing his exercises sorta  His sleep is usually disturbed but worse now with knee.  He usually sleeps on his side and has been on his back since surgery.    PERTINENT HISTORY: 04/12/2024 Rt TKA, Atrial fibrillation, Stage 1 CKD, Type 2  DM, HLD, HTN, obesity, previous Lt TKA in January 2025   PAIN:  NPRS scale: since last PT lowest 3/10 highest 5/10 Pain location: Rt knee Pain description: Achy Aggravating factors: Prolonged postures Relieving factors: Ice, tylenol , oxycodone  and a muscle relaxer  PRECAUTIONS: Other: See past medical history  RED FLAGS: None   WEIGHT BEARING RESTRICTIONS: No  FALLS:  Has patient fallen in last 6 months? No  LIVING ENVIRONMENT: Lives with: lives with their family and lives with their spouse Lives in: House/apartment Stairs: Manages with a handrail and some effort Has following equipment at home: Environmental Consultant - 2 wheeled  OCCUPATION: Retired  PLOF: Independent  PATIENT GOALS: Needs to get up and down 7 stairs now, will need to do more, 5 acres of yard work  NEXT MD VISIT: Today   OBJECTIVE:  Note: Objective measures were completed at Evaluation unless otherwise noted.  DIAGNOSTIC FINDINGS: IMPRESSION: 1. Immediate postoperative right total knee arthroplasty with components in anatomic alignment and no periprosthetic fracture or dislocation.  PATIENT SURVEYS:  PSFS: THE PATIENT SPECIFIC FUNCTIONAL SCALE  Place score of 0-10 (0 = unable to perform activity and 10 = able to perform activity at the same level as before injury or problem)  Activity Date: 04/27/2024    Walking 4    2.  Working in the yard 3    3.  Taking out the trash 3    4.  Shopping 3    5.  Weed eating 3    Total Score 3.2      Total Score = Sum of activity scores/number of activities  Minimally Detectable Change: 3 points (for single activity); 2 points (for average score)  [facility] (nd). The Patient Specific Functional Scale . Retrieved from [email or web address]   COGNITION: 04/27/2024 Overall cognitive status: Within functional limits for tasks assessed     SENSATION: 04/27/2024 [facility]  EDEMA:  04/27/2024 Expected  swelling   LOWER EXTREMITY ROM:  ROM Right 04/27/2024 AROM Left 04/27/2024 AROM  Hip flexion    Hip extension    Hip abduction    Hip adduction    Hip internal rotation    Hip external rotation    Knee flexion 92 129  Knee extension - 7 - 2  Ankle dorsiflexion    Ankle plantarflexion    Ankle inversion    Ankle eversion     (Blank rows = not tested)  LOWER EXTREMITY STRENGTH:   Right 04/27/2024 Left 04/27/2024  Hip flexion    Hip extension    Hip abduction    Hip adduction    Hip internal rotation    Hip external rotation    Knee flexion    Knee extension 6.9 lbs 37.6 lbs  Ankle dorsiflexion    Ankle plantarflexion    Ankle inversion    Ankle eversion     (Blank rows = not tested)  GAIT: 04/27/2024 Distance walked: 100 feet Assistive device utilized: Environmental Consultant -  2 wheeled Level of assistance: Modified independence Comments: Amber was assistive device free before surgery                                                                                                                                              TREATMENT      DATE: 05/11/2024 Therapeutic Exercise:  Sci Fit recumbent bike seat 9 level 1 with bilat UE and LE for 2 minutes, then BLEs only 6 min.     Therapeutic Activities: PT demo & verbal cues on weight shift onto RLE upon arising prior to walking for 3 sec 5 reps.  Pt return demo & verbalized understanding with less antalgic pain.  Leg press BLEs 100# 10 reps 2 sets;  marching for stance knee stability 87# 10 reps with PT tactile cues;  RLE only 43# 10 reps.    Manual:  Seated Rt knee flexion with distraction/IR 1x10 with 10s holds and PT providing PROM knee extension between   Self-Care: PT recommended elevation higher than heart >/= 2x/day for >/= 15 min with intermittent ankle motions.  Pt verbalized understanding.   PT demo & verbal cues on using pillows to tent and position in side lying for sleep.  Pt verbalized understanding.     Vaso:  Rt knee elevated on wedge with towel roll ext stretch with medium compression for 10 minutes at 34deg     TREATMENT      DATE: 05/03/2024 TherEx:  Nustep level 4 with bilat UE and LE for 8 minutes  Slant board gastroc stretch 3x30s  Seated knee extension with reciprocal movement with contralat LE 1x10 with 5s holds each direction  Seated SLR 1x10; highly difficult secondary to appropriate form and lack of knee extension ROM/strength  Supine hamstring stretch with PT assistance 2x30s on Rt   Manual:  Seated Rt knee flexion with distraction/IR 1x10 with 10s holds and PT providing PROM knee extension between   Vaso:  Rt knee elevated on wedge with medium compression for 10 minutes at 34deg     TREATMENT      DATE: 04/28/2024 Therex: Nustep Lvl 6 UE/LE for knee ROM 8 mins.  Seated alternating Rt knee extension/flexion isometric holds in 70 deg knee flexion 5 sec each way x 7 Seated Rt LAQ with end range pauses each direction 2 x 10  Supine heel slide with quad set combo Rt leg 5 sec hold each direction x 10   Supine Rt knee heel prop TKE stretch to tolerance (3 mins) to start vaso (cues for home use routinely during day to work on extension).  Education and cues for routine exercise use in small amounts but frequently during day to help with mobility and stiffness complaints.  Education performed during vaso time.   Manual Seated Rt knee flexion with distraction/IR mobilization with movement.  Extension stretching with ER tibia.  Vaso Rt knee elevated high pressure 34 degrees 10 mins with TKE stretch to start.     TREATMENT      DATE: 04/27/2024 Seated TKE 10 x 5 seconds Seated knee flexion AAROM  (left leg pushes right into flexion) 10 x 5 seconds Supine quadriceps sets with a heel prop 2 sets of 10 for 5 seconds Supine knee flexion with strap 10 x 5 seconds  Vaso: High pressure 34 degrees right knee 10 minutes   PATIENT EDUCATION:  04/27/2024 Education  details: See above Person educated: Patient and Spouse Education method: Explanation, Demonstration, Tactile cues, Verbal cues, and Handouts Education comprehension: verbalized understanding, returned demonstration, verbal cues required, tactile cues required, and needs further education  HOME EXERCISE PROGRAM: Access Code: [email or web address] URL: [email or web address] Date: 04/27/2024 Prepared by: Lamar Ivory  Exercises - Supine Quadricep Sets  - 5 x daily - 7 x weekly - 2 sets - 10 reps - 5 second hold - Seated Knee Extension AROM  - 5 x daily - 7 x weekly - 1 sets - 10 reps - 5 seconds hold - Seated Knee Flexion AAROM  - 5 x daily - 7 x weekly - 1 sets - 10 reps - 5 seconds hold - Hip Flexion  - 5 x daily - 7 x weekly - 1 sets - 10 reps - 5 seconds hold  ASSESSMENT:  CLINICAL IMPRESSION: Patient appears to understand PT recommendations for sleeping.  Pt improved knee functional range and strength with PT activities today.  Pt reported less discomfort with activities.  Pt continues to benefit from skilled PT.    PARTICIPATION LIMITATIONS: driving, shopping, community activity, and yard work  PERSONAL FACTORS: Atrial fibrillation, Stage 1 CKD, Type 2 DM, HLD, HTN, obesity, previous Lt TKA in January 2025 are also affecting patient's functional outcome.   REHAB POTENTIAL: Good  CLINICAL DECISION MAKING: Evolving/moderate complexity  EVALUATION COMPLEXITY: Moderate   GOALS: Goals reviewed with patient? Yes  SHORT TERM GOALS: Target date: 06/08/2024 Nahsir will be independent with his day 1 HEP Baseline: Started 04/27/2024 Goal status: Ongoing   05/11/2024  2.  Improve right knee AROM to 0 - 3 - 105 degrees Baseline: 0 - 7 - 92 degrees Goal status: Ongoing   05/11/2024  3.  Improve right quadriceps strength as assessed by safe transition to a single point cane Baseline: Wheeled walker Goal status: Ongoing   05/11/2024   LONG TERM GOALS: Target date:  07/16/2023  Improve Patient Specific Functional Score to 6 Baseline: 3.2 Goal status: Ongoing   05/11/2024  2.  Shaunte will report right knee pain consistently 3/10 or less on the Visual Analog Scale Baseline: 3-5/10 Goal status: Ongoing   05/11/2024  3.  Improve knee AROM to 0 - 3 - 110 degrees or better Baseline: 0 - 7 -92 degrees Goal status: Ongoing   05/11/2024  4.  Improve bilateral quadriceps strength to at least 40 pounds Baseline: 6.9 pounds on the right Goal status: Ongoing   05/11/2024  5.  Lacharles will be independent with his long-term maintenance HEP at [place] Baseline: Started 11/18/205 Goal status: Ongoing   05/11/2024   PLAN:  PT FREQUENCY: 2-3 times a week  PT DURATION: 12 weeks  PLANNED INTERVENTIONS: 97750- Physical Performance Testing, 97110-Therapeutic exercises, 97530- Therapeutic activity, 97112- Neuromuscular re-education, 97535- Self Care, [id number]- Manual therapy, [phone]- Gait training, [phone]- Vasopneumatic device, Patient/Family education, Balance training, Stair training, Joint mobilization, Cryotherapy, and Moist heat  PLAN FOR NEXT SESSION:  check ROM,  continue with functional activities for mobility gains, quad strengthening, WB improvement for walking progressions.     Grayce Spatz, PT, DPT 05/11/2024, 8:51 AM

## 2024-05-12 ENCOUNTER — Encounter: Payer: Self-pay | Admitting: Physical Therapy

## 2024-05-12 ENCOUNTER — Ambulatory Visit: Admitting: Physical Therapy

## 2024-05-12 DIAGNOSIS — R600 Localized edema: Secondary | ICD-10-CM | POA: Diagnosis not present

## 2024-05-12 DIAGNOSIS — M6281 Muscle weakness (generalized): Secondary | ICD-10-CM

## 2024-05-12 DIAGNOSIS — M25561 Pain in right knee: Secondary | ICD-10-CM

## 2024-05-12 DIAGNOSIS — M25661 Stiffness of right knee, not elsewhere classified: Secondary | ICD-10-CM

## 2024-05-12 DIAGNOSIS — R262 Difficulty in walking, not elsewhere classified: Secondary | ICD-10-CM

## 2024-05-12 NOTE — Therapy (Signed)
 OUTPATIENT PHYSICAL THERAPY TREATMENT   Patient Name: James Robbins. MRN: [id number] DOB:1950/08/26, 73 y.o., male Today's Date: 05/12/2024  END OF SESSION:  PT End of Session - 05/12/24 0759     Visit Number 5    Number of Visits 18    Date for Recertification  07/20/24    Authorization Type [employer] $25 copay    Progress Note Due on Visit 10    PT Start Time 0759    PT Stop Time 0855    PT Time Calculation (min) 56 min    Activity Tolerance Patient tolerated treatment well    Behavior During Therapy WFL for tasks assessed/performed              Past Medical History:  Diagnosis Date   Arthritis    Atrial fibrillation (HCC)    Chronic kidney disease (CKD), stage I    proteinuria   Controlled type 2 diabetes mellitus with diabetic nephropathy (HCC)    Completed DSME 2017   Dysrhythmia    A. Fib   Hematuria 2014   s/p normal urological workup ([place])   Hyperlipidemia    Hypertension    Obesity    Seasonal allergic rhinitis    Sleep apnea    has cpap machine; does not use   Past Surgical History:  Procedure Laterality Date   CARDIOVERSION N/A 11/27/2020   Procedure: CARDIOVERSION;  Surgeon: Nahser, Aleene PARAS, MD;  Location: [facility] ENDOSCOPY;  Service: Cardiovascular;  Laterality: N/A;   CATARACT EXTRACTION Bilateral 08/2021   COLONOSCOPY  06/10/2004   per pt normal ([place])   TOTAL KNEE ARTHROPLASTY Left 06/16/2023   Procedure: LEFT TOTAL KNEE ARTHROPLASTY;  Surgeon: Jerri Kay HERO, MD;  Location: [facility] OR;  Service: Orthopedics;  Laterality: Left;   TOTAL KNEE ARTHROPLASTY Right 04/12/2024   Procedure: ARTHROPLASTY, KNEE, TOTAL;  Surgeon: Jerri Kay HERO, MD;  Location: [facility] OR;  Service: Orthopedics;  Laterality: Right;   VASECTOMY     VASECTOMY REVERSAL     Patient Active Problem List   Diagnosis Date Noted   Status post total right knee replacement 04/12/2024   Primary osteoarthritis of right knee 04/11/2024   Skin rash 03/19/2024   Right kidney mass 03/19/2024    Multiple acquired cysts of kidney 03/19/2024   Diabetic nephropathy with proteinuria (HCC) 12/16/2023   Status post total left knee replacement 06/16/2023   Hypercoagulable state due to permanent atrial fibrillation (HCC) 01/01/2023   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) 01/01/2022   Hypertensive retinopathy of both eyes 01/01/2022   Vision loss of right eye 09/06/2021   Medicare annual wellness visit, subsequent 09/05/2021   Right retinal artery branch occlusion 04/05/2021   Obstructive sleep apnea hypopnea, severe 11/07/2020   Permanent atrial fibrillation (HCC) 11/07/2020   Primary osteoarthritis of knees, bilateral 04/23/2019   Vitamin D  deficiency 09/23/2016   Advanced care planning/counseling discussion 07/20/2015   Encounter for general adult medical examination with abnormal findings 02/21/2014   Severe obesity (BMI 35.0-39.9) with comorbidity (HCC)    Type 2 diabetes mellitus with persistent proteinuria (HCC)    Seasonal allergic rhinitis 12/24/2011   Hypertension 12/24/2011   Hyperlipidemia associated with type 2 diabetes mellitus (HCC) 12/24/2011    PCP: Anton Blas, MD  REFERRING PROVIDER: Kay HERO Jerri, MD  REFERRING DIAG: [phone] (ICD-10-CM) - Status post total right knee replacement M17.11 (ICD-10-CM) - Primary osteoarthritis of right knee  THERAPY DIAG:  Difficulty in walking, not elsewhere classified  Muscle weakness (generalized)  Localized edema  Acute pain of right knee  Stiffness of right knee, not elsewhere classified  Rationale for Evaluation and Treatment: Rehabilitation  ONSET DATE: 04/12/2024 Rt TKA  SUBJECTIVE:   SUBJECTIVE STATEMENT: James Robbins slept better last night getting 3-4 hours sleep.   PERTINENT HISTORY: 04/12/2024 Rt TKA, Atrial fibrillation, Stage 1 CKD, Type 2 DM, HLD, HTN, obesity, previous Lt TKA in January 2025   PAIN:  NPRS scale: since last PT lowest 2/10 highest 5/10 Pain location: Rt knee Pain  description: Achy Aggravating factors: Prolonged postures Relieving factors: Ice, tylenol , oxycodone  and a muscle relaxer  PRECAUTIONS: Other: See past medical history  RED FLAGS: None   WEIGHT BEARING RESTRICTIONS: No  FALLS:  Has patient fallen in last 6 months? No  LIVING ENVIRONMENT: Lives with: lives with their family and lives with their spouse Lives in: House/apartment Stairs: Manages with a handrail and some effort Has following equipment at home: Environmental Consultant - 2 wheeled  OCCUPATION: Retired  PLOF: Independent  PATIENT GOALS: Needs to get up and down 7 stairs now, will need to do more, 5 acres of yard work  NEXT MD VISIT: Today   OBJECTIVE:  Note: Objective measures were completed at Evaluation unless otherwise noted.  DIAGNOSTIC FINDINGS: IMPRESSION: 1. Immediate postoperative right total knee arthroplasty with components in anatomic alignment and no periprosthetic fracture or dislocation.  PATIENT SURVEYS:  PSFS: THE PATIENT SPECIFIC FUNCTIONAL SCALE  Place score of 0-10 (0 = unable to perform activity and 10 = able to perform activity at the same level as before injury or problem)  Activity Date: 04/27/2024    Walking 4    2.  Working in the yard 3    3.  Taking out the trash 3    4.  Shopping 3    5.  Weed eating 3    Total Score 3.2    Total Score = Sum of activity scores/number of activities  Minimally Detectable Change: 3 points (for single activity); 2 points (for average score)  [facility] (nd). The Patient Specific Functional Scale . Retrieved from [email or web address]   COGNITION: 04/27/2024 Overall cognitive status: Within functional limits for tasks assessed     SENSATION: 04/27/2024 [facility]  EDEMA:  04/27/2024 Expected swelling   LOWER EXTREMITY ROM:  ROM Right 04/27/24 AROM Left 04/27/24 AROM Right 05/12/24  Hip flexion     Hip extension     Hip abduction     Hip  adduction     Hip internal rotation     Hip external rotation     Knee flexion 92 129 A: 102*  Knee extension - 7 - 2 Quad set -5*  Ankle dorsiflexion     Ankle plantarflexion     Ankle inversion     Ankle eversion      (Blank rows = not tested)  LOWER EXTREMITY STRENGTH:   Right 04/27/2024 Left 04/27/2024  Hip flexion    Hip extension    Hip abduction    Hip adduction    Hip internal rotation    Hip external rotation    Knee flexion    Knee extension 6.9 lbs 37.6 lbs  Ankle dorsiflexion    Ankle plantarflexion    Ankle inversion    Ankle eversion     (Blank rows = not tested)  GAIT: 04/27/2024 Distance walked: 100 feet Assistive device utilized: Environmental Consultant - 2 wheeled Level of assistance: Modified independence Comments: James Robbins was assistive device free before surgery  TREATMENT      DATE: 05/12/2024 Therapeutic Exercise:  PreCor recumbent bike seat 8 level 1 for 8 min.    Gastroc stretch step heel depression 30 sec hold 3 reps on ea LE RLE Seated heel slides on washcloth with ext / quad set and active knee flexion end range assist with LLE 10 reps.   Therapeutic Activities: Leg press BLEs 100# 10 reps 1 set;  marching for stance knee stability 87# 10 reps with PT tactile cues;  RLE only 43# 10 reps.    Manual:  Seated Rt knee flexion with distraction/IR 1x10 with 10s holds and PT providing PROM knee extension between   Neuromuscular Re-education: Tandem stance RLE in front and in back 30 sec 2 reps ea.  Gait Training Pt amb 5', 10' & 40' with cane with supervision.  No knee instability noted.   PT recommended using cane in house only for now where distances are limited & no ramps/curbs.  If James Robbins notices knee is tired, then use RW or sit to rest.  Pt verbalized understanding.   Vaso:  Rt knee elevated on wedge with towel roll  ext stretch with medium compression for 10 minutes at 34deg    TREATMENT      DATE: 05/11/2024 Therapeutic Exercise:  Sci Fit recumbent bike seat 9 level 1 with bilat UE and LE for 2 minutes, then BLEs only 6 min.     Therapeutic Activities: PT demo & verbal cues on weight shift onto RLE upon arising prior to walking for 3 sec 5 reps.  Pt return demo & verbalized understanding with less antalgic pain.  Leg press BLEs 100# 10 reps 2 sets;  marching for stance knee stability 87# 10 reps with PT tactile cues;  RLE only 43# 10 reps.    Manual:  Seated Rt knee flexion with distraction/IR 1x10 with 10s holds and PT providing PROM knee extension between   Self-Care: PT recommended elevation higher than heart >/= 2x/day for >/= 15 min with intermittent ankle motions.  Pt verbalized understanding.   PT demo & verbal cues on using pillows to tent and position in side lying for sleep.  Pt verbalized understanding.    Vaso:  Rt knee elevated on wedge with towel roll ext stretch with medium compression for 10 minutes at 34deg     TREATMENT      DATE: 05/03/2024 TherEx:  Nustep level 4 with bilat UE and LE for 8 minutes  Slant board gastroc stretch 3x30s  Seated knee extension with reciprocal movement with contralat LE 1x10 with 5s holds each direction  Seated SLR 1x10; highly difficult secondary to appropriate form and lack of knee extension ROM/strength  Supine hamstring stretch with PT assistance 2x30s on Rt   Manual:  Seated Rt knee flexion with distraction/IR 1x10 with 10s holds and PT providing PROM knee extension between   Vaso:  Rt knee elevated on wedge with medium compression for 10 minutes at 34deg     TREATMENT      DATE: 04/28/2024 Therex: Nustep Lvl 6 UE/LE for knee ROM 8 mins.  Seated alternating Rt knee extension/flexion isometric holds in 70 deg knee flexion 5 sec each way x 7 Seated Rt LAQ with end range pauses each direction 2 x 10  Supine heel slide with quad set  combo Rt leg 5 sec hold each direction x 10   Supine Rt knee heel prop TKE stretch to tolerance (3 mins) to start vaso (cues for home use routinely  during day to work on extension).  Education and cues for routine exercise use in small amounts but frequently during day to help with mobility and stiffness complaints.  Education performed during vaso time.   Manual Seated Rt knee flexion with distraction/IR mobilization with movement.  Extension stretching with ER tibia.   Vaso Rt knee elevated high pressure 34 degrees 10 mins with TKE stretch to start.     HOME EXERCISE PROGRAM: Access Code: [email or web address] URL: [email or web address] Date: 04/27/2024 Prepared by: Lamar Ivory  Exercises - Supine Quadricep Sets  - 5 x daily - 7 x weekly - 2 sets - 10 reps - 5 second hold - Seated Knee Extension AROM  - 5 x daily - 7 x weekly - 1 sets - 10 reps - 5 seconds hold - Seated Knee Flexion AAROM  - 5 x daily - 7 x weekly - 1 sets - 10 reps - 5 seconds hold - Hip Flexion  - 5 x daily - 7 x weekly - 1 sets - 10 reps - 5 seconds hold  ASSESSMENT:  CLINICAL IMPRESSION: Patient improved AROM today.  James Robbins appears safe to use cane for household type gait.   Pt continues to benefit from skilled PT.    PARTICIPATION LIMITATIONS: driving, shopping, community activity, and yard work  PERSONAL FACTORS: Atrial fibrillation, Stage 1 CKD, Type 2 DM, HLD, HTN, obesity, previous Lt TKA in January 2025 are also affecting patient's functional outcome.   REHAB POTENTIAL: Good  CLINICAL DECISION MAKING: Evolving/moderate complexity  EVALUATION COMPLEXITY: Moderate   GOALS: Goals reviewed with patient? Yes  SHORT TERM GOALS: Target date: 06/08/2024 James Robbins will be independent with his day 1 HEP Baseline: Started 04/27/2024 Goal status: Ongoing   05/11/2024  2.  Improve right knee AROM to 0 - 3 - 105 degrees Baseline: 0 - 7 - 92 degrees Goal status: Ongoing   05/11/2024  3.  Improve right  quadriceps strength as assessed by safe transition to a single point cane Baseline: Wheeled walker Goal status: Ongoing   05/11/2024   LONG TERM GOALS: Target date: 07/16/2023  Improve Patient Specific Functional Score to 6 Baseline: 3.2 Goal status: Ongoing   05/11/2024  2.  James Robbins will report right knee pain consistently 3/10 or less on the Visual Analog Scale Baseline: 3-5/10 Goal status: Ongoing   05/11/2024  3.  Improve knee AROM to 0 - 3 - 110 degrees or better Baseline: 0 - 7 -92 degrees Goal status: Ongoing   05/11/2024  4.  Improve bilateral quadriceps strength to at least 40 pounds Baseline: 6.9 pounds on the right Goal status: Ongoing   05/11/2024  5.  James Robbins will be independent with his long-term maintenance HEP at [place] Baseline: Started 11/18/205 Goal status: Ongoing   05/11/2024   PLAN:  PT FREQUENCY: 2-3 times a week  PT DURATION: 12 weeks  PLANNED INTERVENTIONS: 97750- Physical Performance Testing, 97110-Therapeutic exercises, 97530- Therapeutic activity, 97112- Neuromuscular re-education, 97535- Self Care, [id number]- Manual therapy, [phone]- Gait training, [phone]- Vasopneumatic device, Patient/Family education, Balance training, Stair training, Joint mobilization, Cryotherapy, and Moist heat  PLAN FOR NEXT SESSION: continue with functional activities for mobility gains, quad strengthening, WB improvement for walking progressions.     Grayce Spatz, PT, DPT 05/12/2024, 12:56 PM

## 2024-05-13 ENCOUNTER — Encounter: Payer: Self-pay | Admitting: Physical Therapy

## 2024-05-13 ENCOUNTER — Ambulatory Visit: Admitting: Physical Therapy

## 2024-05-13 DIAGNOSIS — R600 Localized edema: Secondary | ICD-10-CM | POA: Diagnosis not present

## 2024-05-13 DIAGNOSIS — M25561 Pain in right knee: Secondary | ICD-10-CM | POA: Diagnosis not present

## 2024-05-13 DIAGNOSIS — R262 Difficulty in walking, not elsewhere classified: Secondary | ICD-10-CM

## 2024-05-13 DIAGNOSIS — M6281 Muscle weakness (generalized): Secondary | ICD-10-CM

## 2024-05-13 DIAGNOSIS — M25661 Stiffness of right knee, not elsewhere classified: Secondary | ICD-10-CM | POA: Diagnosis not present

## 2024-05-13 NOTE — Therapy (Signed)
 OUTPATIENT PHYSICAL THERAPY TREATMENT   Patient Name: James Robbins. MRN: [id number] DOB:09/17/1950, 73 y.o., male Today's Date: 05/13/2024  END OF SESSION:  PT End of Session - 05/13/24 0758     Visit Number 6    Number of Visits 18    Date for Recertification  07/20/24    Authorization Type [employer] $25 copay    Progress Note Due on Visit 10    PT Start Time 0800    PT Stop Time 0855    PT Time Calculation (min) 55 min    Activity Tolerance Patient tolerated treatment well    Behavior During Therapy WFL for tasks assessed/performed               Past Medical History:  Diagnosis Date   Arthritis    Atrial fibrillation (HCC)    Chronic kidney disease (CKD), stage I    proteinuria   Controlled type 2 diabetes mellitus with diabetic nephropathy (HCC)    Completed DSME 2017   Dysrhythmia    A. Fib   Hematuria 2014   s/p normal urological workup ([place])   Hyperlipidemia    Hypertension    Obesity    Seasonal allergic rhinitis    Sleep apnea    has cpap machine; does not use   Past Surgical History:  Procedure Laterality Date   CARDIOVERSION N/A 11/27/2020   Procedure: CARDIOVERSION;  Surgeon: Nahser, Aleene PARAS, MD;  Location: [facility];  Service: Cardiovascular;  Laterality: N/A;   CATARACT EXTRACTION Bilateral 08/2021   COLONOSCOPY  06/10/2004   per pt normal ([place])   TOTAL KNEE ARTHROPLASTY Left 06/16/2023   Procedure: LEFT TOTAL KNEE ARTHROPLASTY;  Surgeon: Jerri Kay HERO, MD;  Location: [facility];  Service: Orthopedics;  Laterality: Left;   TOTAL KNEE ARTHROPLASTY Right 04/12/2024   Procedure: ARTHROPLASTY, KNEE, TOTAL;  Surgeon: Jerri Kay HERO, MD;  Location: [facility] OR;  Service: Orthopedics;  Laterality: Right;   VASECTOMY     VASECTOMY REVERSAL     Patient Active Problem List   Diagnosis Date Noted   Status post total right knee replacement 04/12/2024   Primary osteoarthritis of right knee 04/11/2024   Skin rash 03/19/2024   Right kidney mass 03/19/2024    Multiple acquired cysts of kidney 03/19/2024   Diabetic nephropathy with proteinuria (HCC) 12/16/2023   Status post total left knee replacement 06/16/2023   Hypercoagulable state due to permanent atrial fibrillation (HCC) 01/01/2023   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) 01/01/2022   Hypertensive retinopathy of both eyes 01/01/2022   Vision loss of right eye 09/06/2021   Medicare annual wellness visit, subsequent 09/05/2021   Right retinal artery branch occlusion 04/05/2021   Obstructive sleep apnea hypopnea, severe 11/07/2020   Permanent atrial fibrillation (HCC) 11/07/2020   Primary osteoarthritis of knees, bilateral 04/23/2019   Vitamin D  deficiency 09/23/2016   Advanced care planning/counseling discussion 07/20/2015   Encounter for general adult medical examination with abnormal findings 02/21/2014   Severe obesity (BMI 35.0-39.9) with comorbidity (HCC)    Type 2 diabetes mellitus with persistent proteinuria (HCC)    Seasonal allergic rhinitis 12/24/2011   Hypertension 12/24/2011   Hyperlipidemia associated with type 2 diabetes mellitus (HCC) 12/24/2011    PCP: Anton Blas, MD  REFERRING PROVIDER: Kay HERO Jerri, MD  REFERRING DIAG: [phone] (ICD-10-CM) - Status post total right knee replacement M17.11 (ICD-10-CM) - Primary osteoarthritis of right knee  THERAPY DIAG:  Difficulty in walking, not elsewhere classified  Muscle weakness (  generalized)  Localized edema  Acute pain of right knee  Stiffness of right knee, not elsewhere classified  Rationale for Evaluation and Treatment: Rehabilitation  ONSET DATE: 04/12/2024 Rt TKA  SUBJECTIVE:   SUBJECTIVE STATEMENT: James Robbins used his cane inside his home without any issues.   PERTINENT HISTORY: 04/12/2024 Rt TKA, Atrial fibrillation, Stage 1 CKD, Type 2 DM, HLD, HTN, obesity, previous Lt TKA in January 2025   PAIN:  NPRS scale: since last PT lowest 2/10 highest 5/10 Pain location: Rt knee Pain  description: Achy Aggravating factors: Prolonged postures Relieving factors: Ice, tylenol , oxycodone  and a muscle relaxer  PRECAUTIONS: Other: See past medical history  RED FLAGS: None   WEIGHT BEARING RESTRICTIONS: No  FALLS:  Has patient fallen in last 6 months? No  LIVING ENVIRONMENT: Lives with: lives with their family and lives with their spouse Lives in: House/apartment Stairs: Manages with a handrail and some effort Has following equipment at home: Environmental Consultant - 2 wheeled  OCCUPATION: Retired  PLOF: Independent  PATIENT GOALS: Needs to get up and down 7 stairs now, will need to do more, 5 acres of yard work  NEXT MD VISIT: Today   OBJECTIVE:  Note: Objective measures were completed at Evaluation unless otherwise noted.  DIAGNOSTIC FINDINGS: IMPRESSION: 1. Immediate postoperative right total knee arthroplasty with components in anatomic alignment and no periprosthetic fracture or dislocation.  PATIENT SURVEYS:  PSFS: THE PATIENT SPECIFIC FUNCTIONAL SCALE  Place score of 0-10 (0 = unable to perform activity and 10 = able to perform activity at the same level as before injury or problem)  Activity Date: 04/27/2024    Walking 4    2.  Working in the yard 3    3.  Taking out the trash 3    4.  Shopping 3    5.  Weed eating 3    Total Score 3.2    Total Score = Sum of activity scores/number of activities  Minimally Detectable Change: 3 points (for single activity); 2 points (for average score)  [facility] (nd). The Patient Specific Functional Scale . Retrieved from [email or web address]   COGNITION: 04/27/2024 Overall cognitive status: Within functional limits for tasks assessed     SENSATION: 04/27/2024 [facility]  EDEMA:  04/27/2024 Expected swelling   LOWER EXTREMITY ROM:  ROM Right 04/27/24 AROM Left 04/27/24 AROM Right 05/12/24  Hip flexion     Hip extension     Hip abduction     Hip  adduction     Hip internal rotation     Hip external rotation     Knee flexion 92 129 A: 102*  Knee extension - 7 - 2 Quad set -5*  Ankle dorsiflexion     Ankle plantarflexion     Ankle inversion     Ankle eversion      (Blank rows = not tested)  LOWER EXTREMITY STRENGTH:   Right 04/27/2024 Left 04/27/2024  Hip flexion    Hip extension    Hip abduction    Hip adduction    Hip internal rotation    Hip external rotation    Knee flexion    Knee extension 6.9 lbs 37.6 lbs  Ankle dorsiflexion    Ankle plantarflexion    Ankle inversion    Ankle eversion     (Blank rows = not tested)  GAIT: 04/27/2024 Distance walked: 100 feet Assistive device utilized: Environmental Consultant - 2 wheeled Level of assistance: Modified independence Comments: James Robbins was assistive device free before surgery  TREATMENT      DATE: 05/13/2024 Therapeutic Exercise:  PreCor recumbent bike seat 7 level 1 for 3 min then level 3 for 5 min.    While riding bike PT educated on trying to sit behind car (running but in park) and test ability to move RLE gas to brake increasing speed up to sudden stop.  If slow &/or painful, then not ready to drive on road.  Also can not be under influence of Oxy.  Pt verbalized understanding.   Gastroc stretch step heel depression 30 sec hold 3 reps on ea LE RLE Ssupine heel slides on ball with strap with ext / quad set and active knee flexion end range assist with strap 10 reps.   Therapeutic Activities: Leg press BLEs 112# 15 reps 1 set;  marching for stance knee stability 100# 10 reps with PT tactile cues;  RLE only 50# 10 reps.    Neuromuscular Re-education: Tandem stance RLE in front and in back 30 sec 1 reps ea with eyes open frequent touch sink & chair back (safe set-up for home);  2nd rep ea with index finger tip support on sink & chair back  with eyes closed (noted LE stabilization).   Gait Training Pt amb 80' x 2 with cane with supervision.  No knee instability noted.   Pt neg curb with cane (up with LLE & down RLE - unable to do reverse due to RLE strength) with CGA & PT cues.  Pt neg ramp with cane with supervision   Vaso:  Rt knee elevated on wedge with towel roll ext stretch with medium compression for 10 minutes at 34deg    TREATMENT      DATE: 05/12/2024 Therapeutic Exercise:  PreCor recumbent bike seat 8 level 1 for 8 min.    Gastroc stretch step heel depression 30 sec hold 3 reps on ea LE RLE Seated heel slides on washcloth with ext / quad set and active knee flexion end range assist with LLE 10 reps.   Therapeutic Activities: Leg press BLEs 100# 10 reps 1 set;  marching for stance knee stability 87# 10 reps with PT tactile cues;  RLE only 43# 10 reps.    Manual:  Seated Rt knee flexion with distraction/IR 1x10 with 10s holds and PT providing PROM knee extension between   Neuromuscular Re-education: Tandem stance RLE in front and in back 30 sec 2 reps ea.  Gait Training Pt amb 5', 10' & 57' with cane with supervision.  No knee instability noted.   PT recommended using cane in house only for now where distances are limited & no ramps/curbs.  If James Robbins notices knee is tired, then use RW or sit to rest.  Pt verbalized understanding.   Vaso:  Rt knee elevated on wedge with towel roll ext stretch with medium compression for 10 minutes at 34deg    TREATMENT      DATE: 05/11/2024 Therapeutic Exercise:  Sci Fit recumbent bike seat 9 level 1 with bilat UE and LE for 2 minutes, then BLEs only 6 min.     Therapeutic Activities: PT demo & verbal cues on weight shift onto RLE upon arising prior to walking for 3 sec 5 reps.  Pt return demo & verbalized understanding with less antalgic pain.  Leg press BLEs 100# 10 reps 2 sets;  marching for stance knee stability 87# 10 reps with PT tactile cues;  RLE only 43# 10 reps.     Manual:  Seated Rt knee flexion  with distraction/IR 1x10 with 10s holds and PT providing PROM knee extension between   Self-Care: PT recommended elevation higher than heart >/= 2x/day for >/= 15 min with intermittent ankle motions.  Pt verbalized understanding.   PT demo & verbal cues on using pillows to tent and position in side lying for sleep.  Pt verbalized understanding.    Vaso:  Rt knee elevated on wedge with towel roll ext stretch with medium compression for 10 minutes at 34deg     TREATMENT      DATE: 05/03/2024 TherEx:  Nustep level 4 with bilat UE and LE for 8 minutes  Slant board gastroc stretch 3x30s  Seated knee extension with reciprocal movement with contralat LE 1x10 with 5s holds each direction  Seated SLR 1x10; highly difficult secondary to appropriate form and lack of knee extension ROM/strength  Supine hamstring stretch with PT assistance 2x30s on Rt   Manual:  Seated Rt knee flexion with distraction/IR 1x10 with 10s holds and PT providing PROM knee extension between   Vaso:  Rt knee elevated on wedge with medium compression for 10 minutes at 34deg     HOME EXERCISE PROGRAM: Access Code: [email or web address] URL: [email or web address] Date: 04/27/2024 Prepared by: Lamar Ivory  Exercises - Supine Quadricep Sets  - 5 x daily - 7 x weekly - 2 sets - 10 reps - 5 second hold - Seated Knee Extension AROM  - 5 x daily - 7 x weekly - 1 sets - 10 reps - 5 seconds hold - Seated Knee Flexion AAROM  - 5 x daily - 7 x weekly - 1 sets - 10 reps - 5 seconds hold - Hip Flexion  - 5 x daily - 7 x weekly - 1 sets - 10 reps - 5 seconds hold  ASSESSMENT:  CLINICAL IMPRESSION: Patient needs more work with cane on ramp & curb prior to using for community. Patient responds well to progressive exercises with increased range and knee control.   Pt continues to benefit from skilled PT.    PARTICIPATION LIMITATIONS: driving, shopping, community activity, and yard  work  PERSONAL FACTORS: Atrial fibrillation, Stage 1 CKD, Type 2 DM, HLD, HTN, obesity, previous Lt TKA in January 2025 are also affecting patient's functional outcome.   REHAB POTENTIAL: Good  CLINICAL DECISION MAKING: Evolving/moderate complexity  EVALUATION COMPLEXITY: Moderate   GOALS: Goals reviewed with patient? Yes  SHORT TERM GOALS: Target date: 06/08/2024 James Robbins will be independent with his day 1 HEP Baseline: Started 04/27/2024 Goal status: Ongoing   05/11/2024  2.  Improve right knee AROM to 0 - 3 - 105 degrees Baseline: 0 - 7 - 92 degrees Goal status: Ongoing   05/11/2024  3.  Improve right quadriceps strength as assessed by safe transition to a single point cane Baseline: Wheeled walker Goal status: Ongoing   05/11/2024   LONG TERM GOALS: Target date: 07/16/2023  Improve Patient Specific Functional Score to 6 Baseline: 3.2 Goal status: Ongoing   05/11/2024  2.  James Robbins will report right knee pain consistently 3/10 or less on the Visual Analog Scale Baseline: 3-5/10 Goal status: Ongoing   05/11/2024  3.  Improve knee AROM to 0 - 3 - 110 degrees or better Baseline: 0 - 7 -92 degrees Goal status: Ongoing   05/11/2024  4.  Improve bilateral quadriceps strength to at least 40 pounds Baseline: 6.9 pounds on the right Goal status: Ongoing   05/11/2024  5.  James Robbins will be independent with his long-term maintenance HEP  at [place] Baseline: Started 11/18/205 Goal status: Ongoing   05/11/2024   PLAN:  PT FREQUENCY: 2-3 times a week  PT DURATION: 12 weeks  PLANNED INTERVENTIONS: 97750- Physical Performance Testing, 97110-Therapeutic exercises, 97530- Therapeutic activity, 97112- Neuromuscular re-education, 97535- Self Care, [id number]- Manual therapy, [phone]- Gait training, [phone]- Vasopneumatic device, Patient/Family education, Balance training, Stair training, Joint mobilization, Cryotherapy, and Moist heat  PLAN FOR NEXT SESSION: functional activities for mobility gains, quad  strengthening, WB improvement for walking progressions. Balance activities. Gait with cane including ramps & curbs.     Avri Paiva, PT, DPT 05/13/2024, 3:18 PM

## 2024-05-17 ENCOUNTER — Encounter: Payer: Self-pay | Admitting: Physical Therapy

## 2024-05-17 ENCOUNTER — Ambulatory Visit: Admitting: Physical Therapy

## 2024-05-17 DIAGNOSIS — M25562 Pain in left knee: Secondary | ICD-10-CM

## 2024-05-17 DIAGNOSIS — M25561 Pain in right knee: Secondary | ICD-10-CM

## 2024-05-17 DIAGNOSIS — R262 Difficulty in walking, not elsewhere classified: Secondary | ICD-10-CM | POA: Diagnosis not present

## 2024-05-17 DIAGNOSIS — R600 Localized edema: Secondary | ICD-10-CM

## 2024-05-17 DIAGNOSIS — M6281 Muscle weakness (generalized): Secondary | ICD-10-CM

## 2024-05-17 DIAGNOSIS — M25661 Stiffness of right knee, not elsewhere classified: Secondary | ICD-10-CM

## 2024-05-17 NOTE — Therapy (Signed)
 OUTPATIENT PHYSICAL THERAPY TREATMENT   Patient Name: James Robbins. MRN: [id number] DOB:1950-07-14, 73 y.o., male Today's Date: 05/17/2024  END OF SESSION:  PT End of Session - 05/17/24 0849     Visit Number 7    Number of Visits 18    Date for Recertification  07/20/24    Authorization Type [employer] $25 copay    Progress Note Due on Visit 10    PT Start Time 0847    PT Stop Time 0939    PT Time Calculation (min) 52 min    Activity Tolerance Patient tolerated treatment well    Behavior During Therapy [employer] for tasks assessed/performed                Past Medical History:  Diagnosis Date   Arthritis    Atrial fibrillation (HCC)    Chronic kidney disease (CKD), stage I    proteinuria   Controlled type 2 diabetes mellitus with diabetic nephropathy (HCC)    Completed DSME 2017   Dysrhythmia    A. Fib   Hematuria 2014   s/p normal urological workup ([place])   Hyperlipidemia    Hypertension    Obesity    Seasonal allergic rhinitis    Sleep apnea    has cpap machine; does not use   Past Surgical History:  Procedure Laterality Date   CARDIOVERSION N/A 11/27/2020   Procedure: CARDIOVERSION;  Surgeon: Nahser, Aleene PARAS, MD;  Location: [facility] ENDOSCOPY;  Service: Cardiovascular;  Laterality: N/A;   CATARACT EXTRACTION Bilateral 08/2021   COLONOSCOPY  06/10/2004   per pt normal ([place])   TOTAL KNEE ARTHROPLASTY Left 06/16/2023   Procedure: LEFT TOTAL KNEE ARTHROPLASTY;  Surgeon: Jerri Kay HERO, MD;  Location: [facility] OR;  Service: Orthopedics;  Laterality: Left;   TOTAL KNEE ARTHROPLASTY Right 04/12/2024   Procedure: ARTHROPLASTY, KNEE, TOTAL;  Surgeon: Jerri Kay HERO, MD;  Location: [facility] OR;  Service: Orthopedics;  Laterality: Right;   VASECTOMY     VASECTOMY REVERSAL     Patient Active Problem List   Diagnosis Date Noted   Status post total right knee replacement 04/12/2024   Primary osteoarthritis of right knee 04/11/2024   Skin rash 03/19/2024   Right kidney mass  03/19/2024   Multiple acquired cysts of kidney 03/19/2024   Diabetic nephropathy with proteinuria (HCC) 12/16/2023   Status post total left knee replacement 06/16/2023   Hypercoagulable state due to permanent atrial fibrillation (HCC) 01/01/2023   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) 01/01/2022   Hypertensive retinopathy of both eyes 01/01/2022   Vision loss of right eye 09/06/2021   Medicare annual wellness visit, subsequent 09/05/2021   Right retinal artery branch occlusion 04/05/2021   Obstructive sleep apnea hypopnea, severe 11/07/2020   Permanent atrial fibrillation (HCC) 11/07/2020   Primary osteoarthritis of knees, bilateral 04/23/2019   Vitamin D  deficiency 09/23/2016   Advanced care planning/counseling discussion 07/20/2015   Encounter for general adult medical examination with abnormal findings 02/21/2014   Severe obesity (BMI 35.0-39.9) with comorbidity (HCC)    Type 2 diabetes mellitus with persistent proteinuria (HCC)    Seasonal allergic rhinitis 12/24/2011   Hypertension 12/24/2011   Hyperlipidemia associated with type 2 diabetes mellitus (HCC) 12/24/2011    PCP: Anton Blas, MD  REFERRING PROVIDER: Kay HERO Jerri, MD  REFERRING DIAG: [phone] (ICD-10-CM) - Status post total right knee replacement M17.11 (ICD-10-CM) - Primary osteoarthritis of right knee  THERAPY DIAG:  Difficulty in walking, not elsewhere classified  Muscle  weakness (generalized)  Localized edema  Acute pain of right knee  Stiffness of right knee, not elsewhere classified  Acute pain of left knee  Rationale for Evaluation and Treatment: Rehabilitation  ONSET DATE: 04/12/2024 Rt TKA  SUBJECTIVE:   SUBJECTIVE STATEMENT: He arrived using cane stating tired of RW.  He went out to eat without any issues.    PERTINENT HISTORY: 04/12/2024 Rt TKA, Atrial fibrillation, Stage 1 CKD, Type 2 DM, HLD, HTN, obesity, previous Lt TKA in January 2025   PAIN:  NPRS scale:  since last PT lowest /10 highest 5/10 Pain location: Rt knee Pain description: Achy Aggravating factors: Prolonged postures Relieving factors: Ice, tylenol , oxycodone  and a muscle relaxer  PRECAUTIONS: Other: See past medical history  RED FLAGS: None   WEIGHT BEARING RESTRICTIONS: No  FALLS:  Has patient fallen in last 6 months? No  LIVING ENVIRONMENT: Lives with: lives with their family and lives with their spouse Lives in: House/apartment Stairs: Manages with a handrail and some effort Has following equipment at home: Environmental Consultant - 2 wheeled  OCCUPATION: Retired  PLOF: Independent  PATIENT GOALS: Needs to get up and down 7 stairs now, will need to do more, 5 acres of yard work  NEXT MD VISIT: Today   OBJECTIVE:  Note: Objective measures were completed at Evaluation unless otherwise noted.  DIAGNOSTIC FINDINGS: IMPRESSION: 1. Immediate postoperative right total knee arthroplasty with components in anatomic alignment and no periprosthetic fracture or dislocation.  PATIENT SURVEYS:  PSFS: THE PATIENT SPECIFIC FUNCTIONAL SCALE  Place score of 0-10 (0 = unable to perform activity and 10 = able to perform activity at the same level as before injury or problem)  Activity Date: 04/27/2024    Walking 4    2.  Working in the yard 3    3.  Taking out the trash 3    4.  Shopping 3    5.  Weed eating 3    Total Score 3.2    Total Score = Sum of activity scores/number of activities  Minimally Detectable Change: 3 points (for single activity); 2 points (for average score)  [facility] (nd). The Patient Specific Functional Scale . Retrieved from [email or web address]   COGNITION: 04/27/2024 Overall cognitive status: Within functional limits for tasks assessed     SENSATION: 04/27/2024 [facility]  EDEMA:  04/27/2024 Expected swelling   LOWER EXTREMITY ROM:  ROM Right 04/27/24 AROM Left 04/27/24 AROM  Right 05/12/24  Hip flexion     Hip extension     Hip abduction     Hip adduction     Hip internal rotation     Hip external rotation     Knee flexion 92 129 A: 102*  Knee extension - 7 - 2 Quad set -5*  Ankle dorsiflexion     Ankle plantarflexion     Ankle inversion     Ankle eversion      (Blank rows = not tested)  LOWER EXTREMITY STRENGTH:   Right 04/27/2024 Left 04/27/2024  Hip flexion    Hip extension    Hip abduction    Hip adduction    Hip internal rotation    Hip external rotation    Knee flexion    Knee extension 6.9 lbs 37.6 lbs  Ankle dorsiflexion    Ankle plantarflexion    Ankle inversion    Ankle eversion     (Blank rows = not tested)  GAIT: 04/27/2024 Distance walked: 100 feet Assistive device utilized: Environmental Consultant -  2 wheeled Level of assistance: Modified independence Comments: James Robbins was assistive device free before surgery                                                                                                                                              TREATMENT      DATE: 05/17/2024 Therapeutic Exercise:  PreCor recumbent bike seat 7 level 3 for 6 min.    RLE Gastroc stretch step heel depression 30 sec hold 3 reps  RLE hamstring stretch foot on step 30 sec hold 3 reps Seated knee ext hang 5# with foot in chair  1 min 2 reps with 30 sec knee flex bw reps  Therapeutic Activities: Leg press BLEs 112# 15 reps 1 set;  marching for stance knee stability 100# 20 reps with PT tactile cues;  RLE only 50# 10 reps.    Neuromuscular Re-education: Tandem stance RLE in front and in back 30 sec 1 reps ea with eyes open frequent touch sink & chair back (safe set-up for home);  2nd rep ea with index finger tip support on sink & chair back with eyes closed (noted LE stabilization).   Manual Therapy: PROM with overpressure for knee flexion and contract-relax  Vaso:  Rt knee elevated on wedge with towel roll ext stretch with medium compression for 10  minutes at 34deg    TREATMENT      DATE: 05/13/2024 Therapeutic Exercise:  PreCor recumbent bike seat 7 level 1 for 3 min then level 3 for 5 min.    While riding bike PT educated on trying to sit behind car (running but in park) and test ability to move RLE gas to brake increasing speed up to sudden stop.  If slow &/or painful, then not ready to drive on road.  Also can not be under influence of Oxy.  Pt verbalized understanding.   Gastroc stretch step heel depression 30 sec hold 3 reps on ea LE RLE Ssupine heel slides on ball with strap with ext / quad set and active knee flexion end range assist with strap 10 reps.   Therapeutic Activities: Leg press BLEs 112# 15 reps 1 set;  marching for stance knee stability 100# 10 reps with PT tactile cues;  RLE only 50# 10 reps.    Neuromuscular Re-education: Tandem stance RLE in front and in back 30 sec 1 reps ea with eyes open frequent touch sink & chair back (safe set-up for home);  2nd rep ea with index finger tip support on sink & chair back with eyes closed (noted LE stabilization).   Gait Training Pt amb 80' x 2 with cane with supervision.  No knee instability noted.   Pt neg curb with cane (up with LLE & down RLE - unable to do reverse due to RLE strength) with CGA & PT cues.  Pt neg ramp with cane with  supervision   Vaso:  Rt knee elevated on wedge with towel roll ext stretch with medium compression for 10 minutes at 34deg    TREATMENT      DATE: 05/12/2024 Therapeutic Exercise:  PreCor recumbent bike seat 8 level 1 for 8 min.    Gastroc stretch step heel depression 30 sec hold 3 reps on ea LE RLE Seated heel slides on washcloth with ext / quad set and active knee flexion end range assist with LLE 10 reps.   Therapeutic Activities: Leg press BLEs 100# 10 reps 1 set;  marching for stance knee stability 87# 10 reps with PT tactile cues;  RLE only 43# 10 reps.    Manual:  Seated Rt knee flexion with distraction/IR 1x10 with 10s holds and  PT providing PROM knee extension between   Neuromuscular Re-education: Tandem stance RLE in front and in back 30 sec 2 reps ea.  Gait Training Pt amb 5', 10' & 11' with cane with supervision.  No knee instability noted.   PT recommended using cane in house only for now where distances are limited & no ramps/curbs.  If he notices knee is tired, then use RW or sit to rest.  Pt verbalized understanding.   Vaso:  Rt knee elevated on wedge with towel roll ext stretch with medium compression for 10 minutes at 34deg    TREATMENT      DATE: 05/11/2024 Therapeutic Exercise:  Sci Fit recumbent bike seat 9 level 1 with bilat UE and LE for 2 minutes, then BLEs only 6 min.     Therapeutic Activities: PT demo & verbal cues on weight shift onto RLE upon arising prior to walking for 3 sec 5 reps.  Pt return demo & verbalized understanding with less antalgic pain.  Leg press BLEs 100# 10 reps 2 sets;  marching for stance knee stability 87# 10 reps with PT tactile cues;  RLE only 43# 10 reps.    Manual:  Seated Rt knee flexion with distraction/IR 1x10 with 10s holds and PT providing PROM knee extension between   Self-Care: PT recommended elevation higher than heart >/= 2x/day for >/= 15 min with intermittent ankle motions.  Pt verbalized understanding.   PT demo & verbal cues on using pillows to tent and position in side lying for sleep.  Pt verbalized understanding.    Vaso:  Rt knee elevated on wedge with towel roll ext stretch with medium compression for 10 minutes at 34deg     HOME EXERCISE PROGRAM: Access Code: [email or web address] URL: [email or web address] Date: 05/17/2024 Prepared by: Grayce Spatz  Exercises - Supine Quadricep Sets  - 5 x daily - 7 x weekly - 2 sets - 10 reps - 5 second hold - Seated Knee Extension AROM  - 5 x daily - 7 x weekly - 1 sets - 10 reps - 5 seconds hold - Seated Knee Flexion AAROM  - 5 x daily - 7 x weekly - 1 sets - 10 reps - 5 seconds hold - Hip  Flexion  - 5 x daily - 7 x weekly - 1 sets - 10 reps - 5 seconds hold  ASSESSMENT:  CLINICAL IMPRESSION: Patient had less antalgic pattern to his gait leaving PT compared to arriving.  Patient was able to have improved knee stability with functional activities.   Pt continues to benefit from skilled PT.    PARTICIPATION LIMITATIONS: driving, shopping, community activity, and yard work  PERSONAL FACTORS: Atrial fibrillation, Stage 1 CKD, Type 2 DM, HLD,  HTN, obesity, previous Lt TKA in January 2025 are also affecting patient's functional outcome.   REHAB POTENTIAL: Good  CLINICAL DECISION MAKING: Evolving/moderate complexity  EVALUATION COMPLEXITY: Moderate   GOALS: Goals reviewed with patient? Yes  SHORT TERM GOALS: Target date: 06/08/2024 James Robbins will be independent with his day 1 HEP Baseline: Started 04/27/2024 Goal status: Ongoing   05/17/2024  2.  Improve right knee AROM to 0 - 3 - 105 degrees Baseline: 0 - 7 - 92 degrees Goal status: Ongoing   05/17/2024  3.  Improve right quadriceps strength as assessed by safe transition to a single point cane Baseline: Wheeled walker Goal status: Ongoing   05/17/2024   LONG TERM GOALS: Target date: 07/16/2023  Improve Patient Specific Functional Score to 6 Baseline: 3.2 Goal status: Ongoing   05/17/2024  2.  James Robbins will report right knee pain consistently 3/10 or less on the Visual Analog Scale Baseline: 3-5/10 Goal status: Ongoing   05/17/2024  3.  Improve knee AROM to 0 - 3 - 110 degrees or better Baseline: 0 - 7 -92 degrees Goal status: Ongoing   05/17/2024  4.  Improve bilateral quadriceps strength to at least 40 pounds Baseline: 6.9 pounds on the right Goal status: Ongoing   05/17/2024  5.  James Robbins will be independent with his long-term maintenance HEP at [place] Baseline: Started 11/18/205 Goal status: Ongoing   05/17/2024   PLAN:  PT FREQUENCY: 2-3 times a week  PT DURATION: 12 weeks  PLANNED INTERVENTIONS: [id number]-  Physical Performance Testing, 97110-Therapeutic exercises, 97530- Therapeutic activity, 97112- Neuromuscular re-education, 97535- Self Care, [id number]- Manual therapy, [phone]- Gait training, [phone]- Vasopneumatic device, Patient/Family education, Balance training, Stair training, Joint mobilization, Cryotherapy, and Moist heat  PLAN FOR NEXT SESSION:  progress functional activities for mobility gains, quad strengthening, WB improvement for walking progressions. Balance activities. Gait with cane including ramps & curbs.     Grayce Spatz, PT, DPT 05/17/2024, 4:51 PM

## 2024-05-18 ENCOUNTER — Encounter: Admitting: Rehabilitative and Restorative Service Providers"

## 2024-05-19 ENCOUNTER — Ambulatory Visit: Admitting: Rehabilitative and Restorative Service Providers"

## 2024-05-19 ENCOUNTER — Encounter: Payer: Self-pay | Admitting: Rehabilitative and Restorative Service Providers"

## 2024-05-19 DIAGNOSIS — M25561 Pain in right knee: Secondary | ICD-10-CM | POA: Diagnosis not present

## 2024-05-19 DIAGNOSIS — M25661 Stiffness of right knee, not elsewhere classified: Secondary | ICD-10-CM

## 2024-05-19 DIAGNOSIS — M6281 Muscle weakness (generalized): Secondary | ICD-10-CM

## 2024-05-19 DIAGNOSIS — R262 Difficulty in walking, not elsewhere classified: Secondary | ICD-10-CM | POA: Diagnosis not present

## 2024-05-19 DIAGNOSIS — R600 Localized edema: Secondary | ICD-10-CM

## 2024-05-19 NOTE — Therapy (Signed)
 OUTPATIENT PHYSICAL THERAPY TREATMENT   Patient Name: James Robbins. MRN: [id number] DOB:09/24/1950, 73 y.o., male Today's Date: 05/19/2024  END OF SESSION:  PT End of Session - 05/19/24 0835     Visit Number 8    Number of Visits 18    Date for Recertification  07/20/24    Authorization Type [employer] $25 copay    Authorization Time Period 05/07/2024- 07/20/2024    Authorization - Visit Number 8    Authorization - Number of Visits 10    Progress Note Due on Visit 10    PT Start Time 0800    PT Stop Time 0849    PT Time Calculation (min) 49 min    Activity Tolerance Patient tolerated treatment well    Behavior During Therapy WFL for tasks assessed/performed                 Past Medical History:  Diagnosis Date   Arthritis    Atrial fibrillation (HCC)    Chronic kidney disease (CKD), stage I    proteinuria   Controlled type 2 diabetes mellitus with diabetic nephropathy (HCC)    Completed DSME 2017   Dysrhythmia    A. Fib   Hematuria 2014   s/p normal urological workup (James Robbins)   Hyperlipidemia    Hypertension    Obesity    Seasonal allergic rhinitis    Sleep apnea    has cpap machine; does not use   Past Surgical History:  Procedure Laterality Date   CARDIOVERSION N/A 11/27/2020   Procedure: CARDIOVERSION;  Surgeon: Nahser, James PARAS, MD;  Location: [facility] ENDOSCOPY;  Service: Cardiovascular;  Laterality: N/A;   CATARACT EXTRACTION Bilateral 08/2021   COLONOSCOPY  06/10/2004   per pt normal ([place])   TOTAL KNEE ARTHROPLASTY Left 06/16/2023   Procedure: LEFT TOTAL KNEE ARTHROPLASTY;  Surgeon: Robbins James HERO, MD;  Location: [facility] OR;  Service: Orthopedics;  Laterality: Left;   TOTAL KNEE ARTHROPLASTY Right 04/12/2024   Procedure: ARTHROPLASTY, KNEE, TOTAL;  Surgeon: Robbins James HERO, MD;  Location: [facility] OR;  Service: Orthopedics;  Laterality: Right;   VASECTOMY     VASECTOMY REVERSAL     Patient Active Problem List   Diagnosis Date Noted   Status post total right  knee replacement 04/12/2024   Primary osteoarthritis of right knee 04/11/2024   Skin rash 03/19/2024   Right kidney mass 03/19/2024   Multiple acquired cysts of kidney 03/19/2024   Diabetic nephropathy with proteinuria (HCC) 12/16/2023   Status post total left knee replacement 06/16/2023   Hypercoagulable state due to permanent atrial fibrillation (HCC) 01/01/2023   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) 01/01/2022   Hypertensive retinopathy of both eyes 01/01/2022   Vision loss of right eye 09/06/2021   Medicare annual wellness visit, subsequent 09/05/2021   Right retinal artery branch occlusion 04/05/2021   Obstructive sleep apnea hypopnea, severe 11/07/2020   Permanent atrial fibrillation (HCC) 11/07/2020   Primary osteoarthritis of knees, bilateral 04/23/2019   Vitamin D  deficiency 09/23/2016   Advanced care planning/counseling discussion 07/20/2015   Encounter for general adult medical examination with abnormal findings 02/21/2014   Severe obesity (BMI 35.0-39.9) with comorbidity (HCC)    Type 2 diabetes mellitus with persistent proteinuria (HCC)    Seasonal allergic rhinitis 12/24/2011   Hypertension 12/24/2011   Hyperlipidemia associated with type 2 diabetes mellitus (HCC) 12/24/2011    PCP: James Blas, MD  REFERRING PROVIDER: Kay Robbins Jerri, MD  REFERRING DIAG: [phone] (ICD-10-CM) -  Status post total right knee replacement M17.11 (ICD-10-CM) - Primary osteoarthritis of right knee  THERAPY DIAG:  Difficulty in walking, not elsewhere classified  Muscle weakness (generalized)  Localized edema  Acute pain of right knee  Stiffness of right knee, not elsewhere classified  Rationale for Evaluation and Treatment: Rehabilitation  ONSET DATE: 04/12/2024 Rt TKA  SUBJECTIVE:   SUBJECTIVE STATEMENT: Pt indicated feeling some complaints after last visit and today.  Reported just generally sore.  5/10 upon arrival.   PERTINENT HISTORY: 04/12/2024  Rt TKA, Atrial fibrillation, Stage 1 CKD, Type 2 DM, HLD, HTN, obesity, previous Lt TKA in January 2025   PAIN:  NPRS scale: 5/10 upon arrival. Pain location: Rt knee Pain description: Achy Aggravating factors: Prolonged postures Relieving factors: Ice, tylenol , oxycodone  and a muscle relaxer  PRECAUTIONS: Other: See past medical history  RED FLAGS: None   WEIGHT BEARING RESTRICTIONS: No  FALLS:  Has patient fallen in last 6 months? No  LIVING ENVIRONMENT: Lives with: lives with their family and lives with their spouse Lives in: House/apartment Stairs: Manages with a handrail and some effort Has following equipment at home: Environmental Consultant - 2 wheeled  OCCUPATION: Retired  PLOF: Independent  PATIENT GOALS: Needs to get up and down 7 stairs now, will need to do more, 5 acres of yard work  NEXT MD VISIT: Today   OBJECTIVE:  Note: Objective measures were completed at Evaluation unless otherwise noted.  DIAGNOSTIC FINDINGS: IMPRESSION: 1. Immediate postoperative right total knee arthroplasty with components in anatomic alignment and no periprosthetic fracture or dislocation.  PATIENT SURVEYS:  PSFS: THE PATIENT SPECIFIC FUNCTIONAL SCALE  Place score of 0-10 (0 = unable to perform activity and 10 = able to perform activity at the same level as before injury or problem)  Activity Date: 04/27/2024    Walking 4    2.  Working in the yard 3    3.  Taking out the trash 3    4.  Shopping 3    5.  Weed eating 3    Total Score 3.2    Total Score = Sum of activity scores/number of activities  Minimally Detectable Change: 3 points (for single activity); 2 points (for average score)  [facility] (nd). The Patient Specific Functional Scale . Retrieved from [email or web address]   COGNITION: 04/27/2024 Overall cognitive status: Within functional limits for tasks assessed     SENSATION: 04/27/2024 [facility]  EDEMA:   04/27/2024 Expected swelling   LOWER EXTREMITY ROM:  ROM Right 04/27/24 AROM Left 04/27/24 AROM Right 05/12/24  Hip flexion     Hip extension     Hip abduction     Hip adduction     Hip internal rotation     Hip external rotation     Knee flexion 92 129 A: 102*  Knee extension - 7 - 2 Quad set -5*  Ankle dorsiflexion     Ankle plantarflexion     Ankle inversion     Ankle eversion      (Blank rows = not tested)  LOWER EXTREMITY STRENGTH:   Right 04/27/2024 Left 04/27/2024 Right 05/19/2024  Hip flexion     Hip extension     Hip abduction     Hip adduction     Hip internal rotation     Hip external rotation     Knee flexion     Knee extension 6.9 lbs 37.6 lbs  23.2, 22.4 lbs  Ankle dorsiflexion     Ankle plantarflexion  Ankle inversion     Ankle eversion      (Blank rows = not tested)  GAIT: 05/19/2024: Ambulation modified independent with SPC within clinic.   04/27/2024 Distance walked: 100 feet Assistive device utilized: Environmental Consultant - 2 wheeled Level of assistance: Modified independence Comments: James Robbins was assistive device free before surgery                 TREATMENT      DATE:  05/19/2024 Therex: Incline gastroc stretch 30 sec x 4 bilaterally  UBE LE only full revolutions, seat 11 8 mins  Seated Rt leg LAQ 3 lbs with end range pause 2 seconds 2 x 15   TherActivity: ( to improve transfers, stairs, squatting) Leg press double leg 112 lbs x 15 Leg press single leg 37 lbs 2 x 10 bilaterally   Manual Seated Rt knee flexion c distraction/IR mobilization with movement.    Vaso 10 mins Rt knee in elevation, medium compression , 34 degrees.                                                                                                                        TREATMENT      DATE: 05/17/2024 Therapeutic Exercise:  PreCor recumbent bike seat 7 level 3 for 6 min.    RLE Gastroc stretch step heel depression 30 sec hold 3 reps  RLE hamstring stretch  foot on step 30 sec hold 3 reps Seated knee ext hang 5# with foot in chair  1 min 2 reps with 30 sec knee flex bw reps  Therapeutic Activities: Leg press BLEs 112# 15 reps 1 set;  marching for stance knee stability 100# 20 reps with PT tactile cues;  RLE only 50# 10 reps.    Neuromuscular Re-education: Tandem stance RLE in front and in back 30 sec 1 reps ea with eyes open frequent touch sink & chair back (safe set-up for home);  2nd rep ea with index finger tip support on sink & chair back with eyes closed (noted LE stabilization).   Manual Therapy: PROM with overpressure for knee flexion and contract-relax  Vaso:  Rt knee elevated on wedge with towel roll ext stretch with medium compression for 10 minutes at 34deg    TREATMENT      DATE: 05/13/2024 Therapeutic Exercise:  PreCor recumbent bike seat 7 level 1 for 3 min then level 3 for 5 min.    While riding bike PT educated on trying to sit behind car (running but in park) and test ability to move RLE gas to brake increasing speed up to sudden stop.  If slow &/or painful, then not ready to drive on road.  Also can not be under influence of Oxy.  Pt verbalized understanding.   Gastroc stretch step heel depression 30 sec hold 3 reps on ea LE RLE Ssupine heel slides on ball with strap with ext / quad set and active knee flexion end range assist with strap 10 reps.  Therapeutic Activities: Leg press BLEs 112# 15 reps 1 set;  marching for stance knee stability 100# 10 reps with PT tactile cues;  RLE only 50# 10 reps.    Neuromuscular Re-education: Tandem stance RLE in front and in back 30 sec 1 reps ea with eyes open frequent touch sink & chair back (safe set-up for home);  2nd rep ea with index finger tip support on sink & chair back with eyes closed (noted LE stabilization).   Gait Training Pt amb 80' x 2 with cane with supervision.  No knee instability noted.   Pt neg curb with cane (up with LLE & down RLE - unable to do reverse due to  RLE strength) with CGA & PT cues.  Pt neg ramp with cane with supervision   Vaso:  Rt knee elevated on wedge with towel roll ext stretch with medium compression for 10 minutes at 34deg    TREATMENT      DATE: 05/12/2024 Therapeutic Exercise:  PreCor recumbent bike seat 8 level 1 for 8 min.    Gastroc stretch step heel depression 30 sec hold 3 reps on ea LE RLE Seated heel slides on washcloth with ext / quad set and active knee flexion end range assist with LLE 10 reps.   Therapeutic Activities: Leg press BLEs 100# 10 reps 1 set;  marching for stance knee stability 87# 10 reps with PT tactile cues;  RLE only 43# 10 reps.    Manual:  Seated Rt knee flexion with distraction/IR 1x10 with 10s holds and PT providing PROM knee extension between   Neuromuscular Re-education: Tandem stance RLE in front and in back 30 sec 2 reps ea.  Gait Training Pt amb 5', 10' & 74' with cane with supervision.  No knee instability noted.   PT recommended using cane in house only for now where distances are limited & no ramps/curbs.  If he notices knee is tired, then use RW or sit to rest.  Pt verbalized understanding.   Vaso:  Rt knee elevated on wedge with towel roll ext stretch with medium compression for 10 minutes at 34deg    TREATMENT      DATE: 05/11/2024 Therapeutic Exercise:  Sci Fit recumbent bike seat 9 level 1 with bilat UE and LE for 2 minutes, then BLEs only 6 min.     Therapeutic Activities: PT demo & verbal cues on weight shift onto RLE upon arising prior to walking for 3 sec 5 reps.  Pt return demo & verbalized understanding with less antalgic pain.  Leg press BLEs 100# 10 reps 2 sets;  marching for stance knee stability 87# 10 reps with PT tactile cues;  RLE only 43# 10 reps.    Manual:  Seated Rt knee flexion with distraction/IR 1x10 with 10s holds and PT providing PROM knee extension between   Self-Care: PT recommended elevation higher than heart >/= 2x/day for >/= 15 min with  intermittent ankle motions.  Pt verbalized understanding.   PT demo & verbal cues on using pillows to tent and position in side lying for sleep.  Pt verbalized understanding.    Vaso:  Rt knee elevated on wedge with towel roll ext stretch with medium compression for 10 minutes at 34deg     HOME EXERCISE PROGRAM: Access Code: [email or web address] URL: [email or web address] Date: 05/17/2024 Prepared by: James Robbins  Exercises - Supine Quadricep Sets  - 5 x daily - 7 x weekly - 2 sets - 10 reps - 5 second hold -  Seated Knee Extension AROM  - 5 x daily - 7 x weekly - 1 sets - 10 reps - 5 seconds hold - Seated Knee Flexion AAROM  - 5 x daily - 7 x weekly - 1 sets - 10 reps - 5 seconds hold - Hip Flexion  - 5 x daily - 7 x weekly - 1 sets - 10 reps - 5 seconds hold  ASSESSMENT:  CLINICAL IMPRESSION: Flexion mobility continued to improve in quality.  Rt knee extension dynamometry improved compared to eval but still lacking.  Continued skilled PT services indicated to progress strength, mobility , and function towards goals.   PARTICIPATION LIMITATIONS: driving, shopping, community activity, and yard work  PERSONAL FACTORS: Atrial fibrillation, Stage 1 CKD, Type 2 DM, HLD, HTN, obesity, previous Lt TKA in January 2025 are also affecting patient's functional outcome.   REHAB POTENTIAL: Good  CLINICAL DECISION MAKING: Evolving/moderate complexity  EVALUATION COMPLEXITY: Moderate   GOALS: Goals reviewed with patient? Yes  SHORT TERM GOALS: Target date: 06/08/2024 James Robbins will be independent with his day 1 HEP Baseline: Started 04/27/2024 Goal status: Ongoing   05/17/2024  2.  Improve right knee AROM to 0 - 3 - 105 degrees Baseline: 0 - 7 - 92 degrees Goal status: Ongoing   05/17/2024  3.  Improve right quadriceps strength as assessed by safe transition to a single point cane Baseline: Wheeled walker Goal status: Ongoing   05/17/2024   LONG TERM GOALS: Target date:  07/16/2023  Improve Patient Specific Functional Score to 6 Baseline: 3.2 Goal status: Ongoing   05/17/2024  2.  James Robbins will report right knee pain consistently 3/10 or less on the Visual Analog Scale Baseline: 3-5/10 Goal status: Ongoing   05/17/2024  3.  Improve knee AROM to 0 - 3 - 110 degrees or better Baseline: 0 - 7 -92 degrees Goal status: Ongoing   05/17/2024  4.  Improve bilateral quadriceps strength to at least 40 pounds Baseline: 6.9 pounds on the right Goal status: Ongoing   05/17/2024  5.  James Robbins will be independent with his long-term maintenance HEP at [place] Baseline: Started 11/18/205 Goal status: Ongoing   05/17/2024   PLAN:  PT FREQUENCY: 2-3 times a week  PT DURATION: 12 weeks  PLANNED INTERVENTIONS: 97750- Physical Performance Testing, 97110-Therapeutic exercises, 97530- Therapeutic activity, 97112- Neuromuscular re-education, 97535- Self Care, [id number]- Manual therapy, [phone]- Gait training, [phone]- Vasopneumatic device, Patient/Family education, Balance training, Stair training, Joint mobilization, Cryotherapy, and Moist heat  PLAN FOR NEXT SESSION:  Static balance, dynamic balance as able to improve SPC transitioning to independent when appropriate.  Extension strengthening continued.    James Robbins, PT, DPT, OCS, ATC 05/19/24  8:43 AM

## 2024-05-20 ENCOUNTER — Ambulatory Visit: Admitting: Physical Therapy

## 2024-05-20 ENCOUNTER — Encounter: Admitting: Rehabilitative and Restorative Service Providers"

## 2024-05-20 ENCOUNTER — Encounter: Payer: Self-pay | Admitting: Physical Therapy

## 2024-05-20 DIAGNOSIS — R262 Difficulty in walking, not elsewhere classified: Secondary | ICD-10-CM

## 2024-05-20 DIAGNOSIS — R600 Localized edema: Secondary | ICD-10-CM

## 2024-05-20 DIAGNOSIS — M25561 Pain in right knee: Secondary | ICD-10-CM

## 2024-05-20 DIAGNOSIS — M25661 Stiffness of right knee, not elsewhere classified: Secondary | ICD-10-CM

## 2024-05-20 DIAGNOSIS — M6281 Muscle weakness (generalized): Secondary | ICD-10-CM | POA: Diagnosis not present

## 2024-05-20 NOTE — Therapy (Signed)
 OUTPATIENT PHYSICAL THERAPY TREATMENT   Patient Name: James Robbins. MRN: [id number] DOB:06-Jul-1950, 73 y.o., male Today's Date: 05/20/2024  END OF SESSION:  PT End of Session - 05/20/24 1010     Visit Number 9    Number of Visits 18    Date for Recertification  07/20/24    Authorization Type [employer] $25 copay    Authorization Time Period 05/07/2024- 07/20/2024    Authorization - Visit Number 9    Authorization - Number of Visits 10    Progress Note Due on Visit 10    PT Start Time 1015    PT Stop Time 1101    PT Time Calculation (min) 46 min    Activity Tolerance Patient tolerated treatment well    Behavior During Therapy WFL for tasks assessed/performed                  Past Medical History:  Diagnosis Date   Arthritis    Atrial fibrillation (HCC)    Chronic kidney disease (CKD), stage I    proteinuria   Controlled type 2 diabetes mellitus with diabetic nephropathy (HCC)    Completed DSME 2017   Dysrhythmia    A. Fib   Hematuria 2014   s/p normal urological workup ([place])   Hyperlipidemia    Hypertension    Obesity    Seasonal allergic rhinitis    Sleep apnea    has cpap machine; does not use   Past Surgical History:  Procedure Laterality Date   CARDIOVERSION N/A 11/27/2020   Procedure: CARDIOVERSION;  Surgeon: Nahser, Aleene PARAS, MD;  Location: [facility] ENDOSCOPY;  Service: Cardiovascular;  Laterality: N/A;   CATARACT EXTRACTION Bilateral 08/2021   COLONOSCOPY  06/10/2004   per pt normal ([place])   TOTAL KNEE ARTHROPLASTY Left 06/16/2023   Procedure: LEFT TOTAL KNEE ARTHROPLASTY;  Surgeon: Jerri Kay HERO, MD;  Location: [facility] OR;  Service: Orthopedics;  Laterality: Left;   TOTAL KNEE ARTHROPLASTY Right 04/12/2024   Procedure: ARTHROPLASTY, KNEE, TOTAL;  Surgeon: Jerri Kay HERO, MD;  Location: [facility] OR;  Service: Orthopedics;  Laterality: Right;   VASECTOMY     VASECTOMY REVERSAL     Patient Active Problem List   Diagnosis Date Noted   Status post total right  knee replacement 04/12/2024   Primary osteoarthritis of right knee 04/11/2024   Skin rash 03/19/2024   Right kidney mass 03/19/2024   Multiple acquired cysts of kidney 03/19/2024   Diabetic nephropathy with proteinuria (HCC) 12/16/2023   Status post total left knee replacement 06/16/2023   Hypercoagulable state due to permanent atrial fibrillation (HCC) 01/01/2023   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) 01/01/2022   Hypertensive retinopathy of both eyes 01/01/2022   Vision loss of right eye 09/06/2021   Medicare annual wellness visit, subsequent 09/05/2021   Right retinal artery branch occlusion 04/05/2021   Obstructive sleep apnea hypopnea, severe 11/07/2020   Permanent atrial fibrillation (HCC) 11/07/2020   Primary osteoarthritis of knees, bilateral 04/23/2019   Vitamin D  deficiency 09/23/2016   Advanced care planning/counseling discussion 07/20/2015   Encounter for general adult medical examination with abnormal findings 02/21/2014   Severe obesity (BMI 35.0-39.9) with comorbidity (HCC)    Type 2 diabetes mellitus with persistent proteinuria (HCC)    Seasonal allergic rhinitis 12/24/2011   Hypertension 12/24/2011   Hyperlipidemia associated with type 2 diabetes mellitus (HCC) 12/24/2011    PCP: Anton Blas, MD  REFERRING PROVIDER: Kay HERO Jerri, MD  REFERRING DIAG: [phone] (ICD-10-CM) -  Status post total right knee replacement M17.11 (ICD-10-CM) - Primary osteoarthritis of right knee  THERAPY DIAG:  Difficulty in walking, not elsewhere classified  Muscle weakness (generalized)  Localized edema  Acute pain of right knee  Stiffness of right knee, not elsewhere classified  Rationale for Evaluation and Treatment: Rehabilitation  ONSET DATE: 04/12/2024 Rt TKA  SUBJECTIVE:   SUBJECTIVE STATEMENT: Doing pretty well today; pain is about a 5/10  PERTINENT HISTORY: 04/12/2024 Rt TKA, Atrial fibrillation, Stage 1 CKD, Type 2 DM, HLD, HTN, obesity,  previous Lt TKA in January 2025   PAIN:  NPRS scale: 5/10 upon arrival. Pain location: Rt knee Pain description: Achy Aggravating factors: Prolonged postures Relieving factors: Ice, tylenol , oxycodone  and a muscle relaxer  PRECAUTIONS: Other: See past medical history  RED FLAGS: None   WEIGHT BEARING RESTRICTIONS: No  FALLS:  Has patient fallen in last 6 months? No  LIVING ENVIRONMENT: Lives with: lives with their family and lives with their spouse Lives in: House/apartment Stairs: Manages with a handrail and some effort Has following equipment at home: Environmental Consultant - 2 wheeled  OCCUPATION: Retired  PLOF: Independent  PATIENT GOALS: Needs to get up and down 7 stairs now, will need to do more, 5 acres of yard work  OBJECTIVE:  Note: Objective measures were completed at Evaluation unless otherwise noted.  DIAGNOSTIC FINDINGS: IMPRESSION: 1. Immediate postoperative right total knee arthroplasty with components in anatomic alignment and no periprosthetic fracture or dislocation.  PATIENT SURVEYS:  PSFS: THE PATIENT SPECIFIC FUNCTIONAL SCALE  Place score of 0-10 (0 = unable to perform activity and 10 = able to perform activity at the same level as before injury or problem)  Activity Date: 04/27/2024    Walking 4    2.  Working in the yard 3    3.  Taking out the trash 3    4.  Shopping 3    5.  Weed eating 3    Total Score 3.2    Total Score = Sum of activity scores/number of activities  Minimally Detectable Change: 3 points (for single activity); 2 points (for average score)  [facility] (nd). The Patient Specific Functional Scale . Retrieved from [email or web address].[email or web address]   COGNITION: 04/27/2024 Overall cognitive status: Within functional limits for tasks assessed     SENSATION: 04/27/2024 [facility]  EDEMA:  04/27/2024 Expected swelling   LOWER EXTREMITY ROM:  ROM Right 04/27/24 AROM  Left 04/27/24 AROM Right 05/12/24  Hip flexion     Hip extension     Hip abduction     Hip adduction     Hip internal rotation     Hip external rotation     Knee flexion 92 129 A: 102*  Knee extension - 7 - 2 Quad set -5*  Ankle dorsiflexion     Ankle plantarflexion     Ankle inversion     Ankle eversion      (Blank rows = not tested)  LOWER EXTREMITY STRENGTH:   Right 04/27/2024 Left 04/27/2024 Right 05/19/2024  Hip flexion     Hip extension     Hip abduction     Hip adduction     Hip internal rotation     Hip external rotation     Knee flexion     Knee extension 6.9 lbs 37.6 lbs  23.2, 22.4 lbs  Ankle dorsiflexion     Ankle plantarflexion     Ankle inversion     Ankle eversion      (  Blank rows = not tested)  GAIT: 05/19/2024: Ambulation modified independent with SPC within clinic.   04/27/2024 Distance walked: 100 feet Assistive device utilized: Environmental Consultant - 2 wheeled Level of assistance: Modified independence Comments: Richardson was assistive device free before surgery                 TREATMENT 05/20/24 TherAct Recumbent bike seat 6 x 8 min; L5 Leg press double leg 112 lbs 2x15 Leg press single leg 37 lbs 2x15 bilaterally  TherEx Seated LAQ on Rt with 5# 2x10; 5 sec hold Heel prop x 5 min with vaso  Neuro Re-Ed Sidestepping on foam x 5 laps with UE support Tandem walking forward/backward x 5 laps on foam with UE support SLS 5 x 10 sec hold bil; intermittent UE support   Modalities Vaso x 10 min; mod pressure to Rt knee, 34 deg    05/19/2024 Therex: Incline gastroc stretch 30 sec x 4 bilaterally  UBE LE only full revolutions, seat 11 8 mins  Seated Rt leg LAQ 3 lbs with end range pause 2 seconds 2 x 15   TherActivity: ( to improve transfers, stairs, squatting) Leg press double leg 112 lbs x 15 Leg press single leg 37 lbs 2 x 10 bilaterally   Manual Seated Rt knee flexion c distraction/IR mobilization with movement.    Vaso 10 mins Rt  knee in elevation, medium compression , 34 degrees.                                                                                                                        05/17/2024 Therapeutic Exercise:  PreCor recumbent bike seat 7 level 3 for 6 min.    RLE Gastroc stretch step heel depression 30 sec hold 3 reps  RLE hamstring stretch foot on step 30 sec hold 3 reps Seated knee ext hang 5# with foot in chair  1 min 2 reps with 30 sec knee flex bw reps  Therapeutic Activities: Leg press BLEs 112# 15 reps 1 set;  marching for stance knee stability 100# 20 reps with PT tactile cues;  RLE only 50# 10 reps.    Neuromuscular Re-education: Tandem stance RLE in front and in back 30 sec 1 reps ea with eyes open frequent touch sink & chair back (safe set-up for home);  2nd rep ea with index finger tip support on sink & chair back with eyes closed (noted LE stabilization).   Manual Therapy: PROM with overpressure for knee flexion and contract-relax  Vaso:  Rt knee elevated on wedge with towel roll ext stretch with medium compression for 10 minutes at 34deg    05/13/2024 Therapeutic Exercise:  PreCor recumbent bike seat 7 level 1 for 3 min then level 3 for 5 min.    While riding bike PT educated on trying to sit behind car (running but in park) and test ability to move RLE gas to brake increasing speed up to sudden stop.  If slow &/or painful, then  not ready to drive on road.  Also can not be under influence of Oxy.  Pt verbalized understanding.   Gastroc stretch step heel depression 30 sec hold 3 reps on ea LE RLE Ssupine heel slides on ball with strap with ext / quad set and active knee flexion end range assist with strap 10 reps.   Therapeutic Activities: Leg press BLEs 112# 15 reps 1 set;  marching for stance knee stability 100# 10 reps with PT tactile cues;  RLE only 50# 10 reps.    Neuromuscular Re-education: Tandem stance RLE in front and in back 30 sec 1 reps ea with eyes open frequent  touch sink & chair back (safe set-up for home);  2nd rep ea with index finger tip support on sink & chair back with eyes closed (noted LE stabilization).   Gait Training Pt amb 80' x 2 with cane with supervision.  No knee instability noted.   Pt neg curb with cane (up with LLE & down RLE - unable to do reverse due to RLE strength) with CGA & PT cues.  Pt neg ramp with cane with supervision   Vaso:  Rt knee elevated on wedge with towel roll ext stretch with medium compression for 10 minutes at 34deg   HOME EXERCISE PROGRAM: Access Code: [email or web address] URL: [email or web address] Date: 05/17/2024 Prepared by: Grayce Spatz  Exercises - Supine Quadricep Sets  - 5 x daily - 7 x weekly - 2 sets - 10 reps - 5 second hold - Seated Knee Extension AROM  - 5 x daily - 7 x weekly - 1 sets - 10 reps - 5 seconds hold - Seated Knee Flexion AAROM  - 5 x daily - 7 x weekly - 1 sets - 10 reps - 5 seconds hold - Hip Flexion  - 5 x daily - 7 x weekly - 1 sets - 10 reps - 5 seconds hold  ASSESSMENT:  CLINICAL IMPRESSION: Pt tolerated session well today, still needing SPC for safe ambulation and difficulty noted with balance challenges.  Will continue to benefit from PT to maximize function.  PARTICIPATION LIMITATIONS: driving, shopping, community activity, and yard work  PERSONAL FACTORS: Atrial fibrillation, Stage 1 CKD, Type 2 DM, HLD, HTN, obesity, previous Lt TKA in January 2025 are also affecting patient's functional outcome.   REHAB POTENTIAL: Good  CLINICAL DECISION MAKING: Evolving/moderate complexity  EVALUATION COMPLEXITY: Moderate   GOALS: Goals reviewed with patient? Yes  SHORT TERM GOALS: Target date: 06/08/2024  [place] will be independent with his day 1 HEP Baseline: Started 04/27/2024 Goal status: Ongoing   05/17/2024  2.  Improve right knee AROM to 0 - 3 - 105 degrees Baseline: 0 - 7 - 92 degrees Goal status: Ongoing   05/17/2024  3.  Improve right quadriceps  strength as assessed by safe transition to a single point cane Baseline: Wheeled walker Goal status: Ongoing   05/17/2024   LONG TERM GOALS: Target date: 07/16/2023  Improve Patient Specific Functional Score to 6 Baseline: 3.2 Goal status: Ongoing   05/17/2024  2.  Jourdain will report right knee pain consistently 3/10 or less on the Visual Analog Scale Baseline: 3-5/10 Goal status: Ongoing   05/17/2024  3.  Improve knee AROM to 0 - 3 - 110 degrees or better Baseline: 0 - 7 -92 degrees Goal status: Ongoing   05/17/2024  4.  Improve bilateral quadriceps strength to at least 40 pounds Baseline: 6.9 pounds on the right Goal status: Ongoing   05/17/2024  5.  Jaspreet will be independent with his long-term maintenance HEP at [place] Baseline: Started 11/18/205 Goal status: Ongoing   05/17/2024   PLAN:  PT FREQUENCY: 2-3 times a week  PT DURATION: 12 weeks  PLANNED INTERVENTIONS: 97750- Physical Performance Testing, 97110-Therapeutic exercises, 97530- Therapeutic activity, 97112- Neuromuscular re-education, 97535- Self Care, [id number]- Manual therapy, [phone]- Gait training, [phone]- Vasopneumatic device, Patient/Family education, Balance training, Stair training, Joint mobilization, Cryotherapy, and Moist heat  PLAN FOR NEXT SESSION:  10th visit - will need to resubmit for additional visits,  Static balance, dynamic balance as able to improve SPC transitioning to independent when appropriate.  Extension strengthening continued.    NEXT MD VISIT: 05/25/24  Corean JULIANNA Ku, PT, DPT 05/20/2024 10:53 AM

## 2024-05-25 ENCOUNTER — Ambulatory Visit: Payer: Self-pay

## 2024-05-25 ENCOUNTER — Encounter: Payer: Self-pay | Admitting: Rehabilitative and Restorative Service Providers"

## 2024-05-25 ENCOUNTER — Ambulatory Visit: Admitting: Orthopaedic Surgery

## 2024-05-25 ENCOUNTER — Ambulatory Visit: Admitting: Rehabilitative and Restorative Service Providers"

## 2024-05-25 ENCOUNTER — Other Ambulatory Visit: Payer: Self-pay | Admitting: Physician Assistant

## 2024-05-25 ENCOUNTER — Telehealth: Payer: Self-pay | Admitting: *Deleted

## 2024-05-25 DIAGNOSIS — M25561 Pain in right knee: Secondary | ICD-10-CM

## 2024-05-25 DIAGNOSIS — R262 Difficulty in walking, not elsewhere classified: Secondary | ICD-10-CM

## 2024-05-25 DIAGNOSIS — Z96651 Presence of right artificial knee joint: Secondary | ICD-10-CM

## 2024-05-25 DIAGNOSIS — M25661 Stiffness of right knee, not elsewhere classified: Secondary | ICD-10-CM

## 2024-05-25 DIAGNOSIS — M6281 Muscle weakness (generalized): Secondary | ICD-10-CM

## 2024-05-25 DIAGNOSIS — R600 Localized edema: Secondary | ICD-10-CM

## 2024-05-25 MED ORDER — TRAMADOL HCL 50 MG PO TABS: 50.0000 mg | ORAL_TABLET | Freq: Two times a day (BID) | ORAL | 0 refills | Status: DC | PRN

## 2024-05-25 NOTE — Progress Notes (Signed)
 Post-Op Visit Note   Patient: James Robbins.           Date of Birth: [birth year]           MRN: [id number] Visit Date: 05/25/2024 PCP: Rilla Baller, MD   Assessment & Plan:  Chief Complaint:  Chief Complaint  Patient presents with   Right Knee - Follow-up    Right TKA 04/12/2024   Visit Diagnoses:  1. Status post total right knee replacement     Plan: History of Present Illness James Robbins. is a 73 year old male who presents for follow-up of his right total knee replacement.  He is six weeks post right total knee replacement and has mild right knee pain, which he relates to increased use of his home exercise bike. He attends physical therapy twice weekly, has completed about six to eight sessions including prior home health PT, and continues to work on flexibility. His spouse reports he is not consistent with exercises on his own despite having a stationary bike at home.  Physical Exam MUSCULOSKELETAL: Right knee scars are well-healed with a good appearance.  Mild edema.  Range of motion is progressing nicely.  Results RADIOLOGY Knee X-ray: Normal alignment and positioning of knee arthroplasty components (05/25/2024)  Assessment and Plan Status post right total knee replacement Six weeks post-surgery with good progress. Mild pain from overexertion. Scars well-healed. X-rays show good alignment. Flexibility improving without stiffness concerns. - I am happy with his range of motion so I have suggested that he back down to once a week physical therapy and home exercises. - Perform home exercises for flexibility and strength. - Follow-up with Morna in six weeks.  Follow-Up Instructions: Return in about 6 weeks (around 07/06/2024) for with lindsey.   Orders:  Orders Placed This Encounter  Procedures   XR Knee 1-2 Views Right   No orders of the defined types were placed in this encounter.   Imaging: XR Knee 1-2 Views Right Result Date:  05/25/2024 X-rays of the right knee demonstrate a stable right total knee replacement in good alignment    PMFS History: Patient Active Problem List   Diagnosis Date Noted   Status post total right knee replacement 04/12/2024   Primary osteoarthritis of right knee 04/11/2024   Skin rash 03/19/2024   Right kidney mass 03/19/2024   Multiple acquired cysts of kidney 03/19/2024   Diabetic nephropathy with proteinuria (HCC) 12/16/2023   Status post total left knee replacement 06/16/2023   Hypercoagulable state due to permanent atrial fibrillation (HCC) 01/01/2023   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) 01/01/2022   Hypertensive retinopathy of both eyes 01/01/2022   Vision loss of right eye 09/06/2021   Medicare annual wellness visit, subsequent 09/05/2021   Right retinal artery branch occlusion 04/05/2021   Obstructive sleep apnea hypopnea, severe 11/07/2020   Permanent atrial fibrillation (HCC) 11/07/2020   Primary osteoarthritis of knees, bilateral 04/23/2019   Vitamin D  deficiency 09/23/2016   Advanced care planning/counseling discussion 07/20/2015   Encounter for general adult medical examination with abnormal findings 02/21/2014   Severe obesity (BMI 35.0-39.9) with comorbidity (HCC)    Type 2 diabetes mellitus with persistent proteinuria (HCC)    Seasonal allergic rhinitis 12/24/2011   Hypertension 12/24/2011   Hyperlipidemia associated with type 2 diabetes mellitus (HCC) 12/24/2011   Past Medical History:  Diagnosis Date   Arthritis    Atrial fibrillation (HCC)    Chronic kidney disease (CKD), stage I  proteinuria   Controlled type 2 diabetes mellitus with diabetic nephropathy (HCC)    Completed DSME 2017   Dysrhythmia    A. Fib   Hematuria 2014   s/p normal urological workup ([place])   Hyperlipidemia    Hypertension    Obesity    Seasonal allergic rhinitis    Sleep apnea    has cpap machine; does not use    Family History  Problem Relation  Age of Onset   Diabetes Mother    Hypertension Mother    Hyperlipidemia Mother    CAD Father        27v CABG, smoker   Osteoporosis Sister    Cancer Neg Hx     Past Surgical History:  Procedure Laterality Date   CARDIOVERSION N/A 11/27/2020   Procedure: CARDIOVERSION;  Surgeon: Nahser, Aleene PARAS, MD;  Location: [facility] ENDOSCOPY;  Service: Cardiovascular;  Laterality: N/A;   CATARACT EXTRACTION Bilateral 08/2021   COLONOSCOPY  06/10/2004   per pt normal ([place])   TOTAL KNEE ARTHROPLASTY Left 06/16/2023   Procedure: LEFT TOTAL KNEE ARTHROPLASTY;  Surgeon: Jerri Kay HERO, MD;  Location: [facility] OR;  Service: Orthopedics;  Laterality: Left;   TOTAL KNEE ARTHROPLASTY Right 04/12/2024   Procedure: ARTHROPLASTY, KNEE, TOTAL;  Surgeon: Jerri Kay HERO, MD;  Location: [facility] OR;  Service: Orthopedics;  Laterality: Right;   VASECTOMY     VASECTOMY REVERSAL     Social History   Occupational History   Occupation: retired    Associate Professor: UNEMPLOYED    Comment: 2006  Tobacco Use   Smoking status: Former    Current packs/day: 0.00    Types: Cigarettes    Quit date: 06/09/1986    Years since quitting: 37.9   Smokeless tobacco: Never  Vaping Use   Vaping status: Never Used  Substance and Sexual Activity   Alcohol  use: No    Alcohol /week: 0.0 standard drinks of alcohol    Drug use: No   Sexual activity: Not Currently

## 2024-05-25 NOTE — Telephone Encounter (Signed)
 Just sent to pharmacy on file.

## 2024-05-25 NOTE — Therapy (Signed)
 OUTPATIENT PHYSICAL THERAPY TREATMENT/ PROGRESS NOTE   Patient Name: James Robbins. MRN: [id number] DOB:05-07-1951, 73 y.o., male Today's Date: 05/25/2024  Progress Note Reporting Period 04/27/2024 to 05/25/2024  See note below for Objective Data and Assessment of Progress/Goals.      END OF SESSION:  PT End of Session - 05/25/24 1510     Visit Number 10    Number of Visits 18    Date for Recertification  07/20/24    Authorization Type [employer] $25 copay    Authorization Time Period 05/07/2024- 07/20/2024    Authorization - Visit Number 10    Authorization - Number of Visits 10    Progress Note Due on Visit 20    PT Start Time 1508    PT Stop Time 1557    PT Time Calculation (min) 49 min    Activity Tolerance Patient tolerated treatment well    Behavior During Therapy WFL for tasks assessed/performed              Past Medical History:  Diagnosis Date   Arthritis    Atrial fibrillation (HCC)    Chronic kidney disease (CKD), stage I    proteinuria   Controlled type 2 diabetes mellitus with diabetic nephropathy (HCC)    Completed DSME 2017   Dysrhythmia    A. Fib   Hematuria 2014   s/p normal urological workup ([place])   Hyperlipidemia    Hypertension    Obesity    Seasonal allergic rhinitis    Sleep apnea    has cpap machine; does not use   Past Surgical History:  Procedure Laterality Date   CARDIOVERSION N/A 11/27/2020   Procedure: CARDIOVERSION;  Surgeon: Nahser, Aleene PARAS, MD;  Location: [facility] ENDOSCOPY;  Service: Cardiovascular;  Laterality: N/A;   CATARACT EXTRACTION Bilateral 08/2021   COLONOSCOPY  06/10/2004   per pt normal ()   TOTAL KNEE ARTHROPLASTY Left 06/16/2023   Procedure: LEFT TOTAL KNEE ARTHROPLASTY;  Surgeon: Jerri Kay HERO, MD;  Location: [facility] OR;  Service: Orthopedics;  Laterality: Left;   TOTAL KNEE ARTHROPLASTY Right 04/12/2024   Procedure: ARTHROPLASTY, KNEE, TOTAL;  Surgeon: Jerri Kay HERO, MD;  Location: [facility] OR;  Service:  Orthopedics;  Laterality: Right;   VASECTOMY     VASECTOMY REVERSAL     Patient Active Problem List   Diagnosis Date Noted   Status post total right knee replacement 04/12/2024   Primary osteoarthritis of right knee 04/11/2024   Skin rash 03/19/2024   Right kidney mass 03/19/2024   Multiple acquired cysts of kidney 03/19/2024   Diabetic nephropathy with proteinuria (HCC) 12/16/2023   Status post total left knee replacement 06/16/2023   Hypercoagulable state due to permanent atrial fibrillation (HCC) 01/01/2023   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) 01/01/2022   Hypertensive retinopathy of both eyes 01/01/2022   Vision loss of right eye 09/06/2021   Medicare annual wellness visit, subsequent 09/05/2021   Right retinal artery branch occlusion 04/05/2021   Obstructive sleep apnea hypopnea, severe 11/07/2020   Permanent atrial fibrillation (HCC) 11/07/2020   Primary osteoarthritis of knees, bilateral 04/23/2019   Vitamin D  deficiency 09/23/2016   Advanced care planning/counseling discussion 07/20/2015   Encounter for general adult medical examination with abnormal findings 02/21/2014   Severe obesity (BMI 35.0-39.9) with comorbidity (HCC)    Type 2 diabetes mellitus with persistent proteinuria (HCC)    Seasonal allergic rhinitis 12/24/2011   Hypertension 12/24/2011   Hyperlipidemia associated with type 2 diabetes mellitus (  HCC) 12/24/2011    PCP: Anton Blas, MD  REFERRING PROVIDER: Kay CHRISTELLA Cummins, MD  REFERRING DIAG: [phone] (ICD-10-CM) - Status post total right knee replacement M17.11 (ICD-10-CM) - Primary osteoarthritis of right knee  THERAPY DIAG:  Difficulty in walking, not elsewhere classified  Muscle weakness (generalized)  Localized edema  Acute pain of right knee  Stiffness of right knee, not elsewhere classified  Rationale for Evaluation and Treatment: Rehabilitation  ONSET DATE: 04/12/2024 Rt TKA  SUBJECTIVE:   SUBJECTIVE  STATEMENT: Pt indicated pain at worst in last few days at 5/10, noted with flexion on bike.   PERTINENT HISTORY: 04/12/2024 Rt TKA, Atrial fibrillation, Stage 1 CKD, Type 2 DM, HLD, HTN, obesity, previous Lt TKA in January 2025   PAIN:  NPRS scale: at worst in last 3-4 days: up to 5/10 Pain location: Rt knee Pain description: Achy Aggravating factors: Prolonged postures Relieving factors: Ice, tylenol , oxycodone  and a muscle relaxer  PRECAUTIONS: Other: See past medical history  RED FLAGS: None   WEIGHT BEARING RESTRICTIONS: No  FALLS:  Has patient fallen in last 6 months? No  LIVING ENVIRONMENT: Lives with: lives with their family and lives with their spouse Lives in: House/apartment Stairs: Manages with a handrail and some effort Has following equipment at home: Environmental Consultant - 2 wheeled  OCCUPATION: Retired  PLOF: Independent  PATIENT GOALS: Needs to get up and down 7 stairs now, will need to do more, 5 acres of yard work  OBJECTIVE:  Note: Objective measures were completed at Evaluation unless otherwise noted.  DIAGNOSTIC FINDINGS: IMPRESSION: 1. Immediate postoperative right total knee arthroplasty with components in anatomic alignment and no periprosthetic fracture or dislocation.  PATIENT SURVEYS:  PSFS: THE PATIENT SPECIFIC FUNCTIONAL SCALE  Place score of 0-10 (0 = unable to perform activity and 10 = able to perform activity at the same level as before injury or problem)  Activity Date: 04/27/2024 05/25/2024   Walking 4 3   2.  Working in the yard 3 3   3.  Taking out the trash 3 5   4.  Shopping 3 4   5.  Weed eating 3 0   Total Score 3.2 3.0   Total Score = Sum of activity scores/number of activities  Minimally Detectable Change: 3 points (for single activity); 2 points (for average score)  [facility] (nd). The Patient Specific Functional Scale . Retrieved from [email or web address]    COGNITION: 04/27/2024 Overall cognitive status: Within functional limits for tasks assessed     SENSATION: 04/27/2024 [facility]  EDEMA:  04/27/2024 Expected swelling   LOWER EXTREMITY ROM:  ROM Right 04/27/24 AROM Left 04/27/24 AROM Right 05/12/24 Right 05/25/2024  Hip flexion      Hip extension      Hip abduction      Hip adduction      Hip internal rotation      Hip external rotation      Knee flexion 92 129 A: 102* 120 AROM in supine heel slide  Knee extension - 7 - 2 Quad set -5* -9 AROM in seated LAQ   -5 in supine heel prop PROM  Ankle dorsiflexion      Ankle plantarflexion      Ankle inversion      Ankle eversion       (Blank rows = not tested)  LOWER EXTREMITY STRENGTH:   Right 04/27/2024 Left 04/27/2024 Right 05/19/2024 Right 05/25/2024  Hip flexion      Hip extension  Hip abduction      Hip adduction      Hip internal rotation      Hip external rotation      Knee flexion    5/5  Knee extension 6.9 lbs 37.6 lbs  23.2, 22.4 lbs 4/5 26, 24.8 lbs  Ankle dorsiflexion      Ankle plantarflexion      Ankle inversion      Ankle eversion       (Blank rows = not tested)  GAIT: 05/19/2024: Ambulation modified independent with SPC within clinic.   04/27/2024 Distance walked: 100 feet Assistive device utilized: Environmental Consultant - 2 wheeled Level of assistance: Modified independence Comments: James Robbins was assistive device free before surgery                 TREATMENT      DATE:  05/25/2024 Therex: UBE LE only lvl 3.5 8 mins, seat 12 Incline gastroc 30 sec hold x 5 bilaterally Supine heel slide Rt AROM 5 sec hold x 10  Supine bilateral SAQ with triangle wedge under knees 3 sec hold 2 x 10  Re-education on importance of TKE stretching for extension gains, focusing on toe up positioning without movement to tolerance.  Performed 1 min in clinic.    Neuro Re-ed: Tandem stance 1 min x 2 bilateral on foam in // bars with occasional to moderate HHA,  SBA Tandem ambulation on ground in // bars forward 10 ft x 6 with occasional to moderate HHA on bars.    Vaso: 10 mins Rt knee medium compression 34 degrees in elevation.   TREATMENT      DATE: 05/20/24 TherAct Recumbent bike seat 6 x 8 min; L5 Leg press double leg 112 lbs 2x15 Leg press single leg 37 lbs 2x15 bilaterally  TherEx Seated LAQ on Rt with 5# 2x10; 5 sec hold Heel prop x 5 min with vaso  Neuro Re-Ed Sidestepping on foam x 5 laps with UE support Tandem walking forward/backward x 5 laps on foam with UE support SLS 5 x 10 sec hold bil; intermittent UE support   Modalities Vaso x 10 min; mod pressure to Rt knee, 34 deg    TREATMENT      DATE: 05/19/2024 Therex: Incline gastroc stretch 30 sec x 4 bilaterally  UBE LE only full revolutions, seat 11 8 mins  Seated Rt leg LAQ 3 lbs with end range pause 2 seconds 2 x 15   TherActivity: ( to improve transfers, stairs, squatting) Leg press double leg 112 lbs x 15 Leg press single leg 37 lbs 2 x 10 bilaterally   Manual Seated Rt knee flexion c distraction/IR mobilization with movement.    Vaso 10 mins Rt knee in elevation, medium compression , 34 degrees.                                                                                                                        TREATMENT      DATE: 05/17/2024  Therapeutic Exercise:  PreCor recumbent bike seat 7 level 3 for 6 min.    RLE Gastroc stretch step heel depression 30 sec hold 3 reps  RLE hamstring stretch foot on step 30 sec hold 3 reps Seated knee ext hang 5# with foot in chair  1 min 2 reps with 30 sec knee flex bw reps  Therapeutic Activities: Leg press BLEs 112# 15 reps 1 set;  marching for stance knee stability 100# 20 reps with PT tactile cues;  RLE only 50# 10 reps.    Neuromuscular Re-education: Tandem stance RLE in front and in back 30 sec 1 reps ea with eyes open frequent touch sink & chair back (safe set-up for home);  2nd rep ea with index finger  tip support on sink & chair back with eyes closed (noted LE stabilization).   Manual Therapy: PROM with overpressure for knee flexion and contract-relax  Vaso:  Rt knee elevated on wedge with towel roll ext stretch with medium compression for 10 minutes at 34deg     HOME EXERCISE PROGRAM: Access Code: [email or web address] URL: [email or web address] Date: 05/17/2024 Prepared by: Grayce Spatz  Exercises - Supine Quadricep Sets  - 5 x daily - 7 x weekly - 2 sets - 10 reps - 5 second hold - Seated Knee Extension AROM  - 5 x daily - 7 x weekly - 1 sets - 10 reps - 5 seconds hold - Seated Knee Flexion AAROM  - 5 x daily - 7 x weekly - 1 sets - 10 reps - 5 seconds hold - Hip Flexion  - 5 x daily - 7 x weekly - 1 sets - 10 reps - 5 seconds hold  ASSESSMENT:  CLINICAL IMPRESSION: The patient has attended 10 visits over the course of treatment cycle. See objective data above for updated information regarding current presentation.  Gains have been noted in strength, mobility and balance but continued medical necessity for continued skilled PT services indicated at this time.  Important to continue to gain strength, mobility and coordination to improve functional movements.    PARTICIPATION LIMITATIONS: driving, shopping, community activity, and yard work  PERSONAL FACTORS: Atrial fibrillation, Stage 1 CKD, Type 2 DM, HLD, HTN, obesity, previous Lt TKA in January 2025 are also affecting patient's functional outcome.   REHAB POTENTIAL: Good  CLINICAL DECISION MAKING: Evolving/moderate complexity  EVALUATION COMPLEXITY: Moderate   GOALS: Goals reviewed with patient? Yes  SHORT TERM GOALS: Target date: 06/08/2024  James Robbins will be independent with his day 1 HEP  Goal status: Met 05/25/2024  2.  Improve right knee AROM to 0 - 3 - 105 degrees  Goal status: on going 05/25/2024  3.  Improve right quadriceps strength as assessed by safe transition to a single point cane  Goal  status: Met 05/25/2024   LONG TERM GOALS: Target date: 07/16/2023  Improve Patient Specific Functional Score to 6 Baseline: 3.2 Goal status: on going 05/25/2024  2.  James Robbins will report right knee pain consistently 3/10 or less on the Visual Analog Scale Baseline: 3-5/10 Goal status: on going 05/25/2024  3.  Improve knee AROM to 0 - 3 - 110 degrees or better Baseline: 0 - 7 -92 degrees Goal status: on going 05/25/2024  4.  Improve bilateral quadriceps strength to at least 40 pounds Baseline: 6.9 pounds on the right Goal status: on going 05/25/2024  5.  James Robbins will be independent with his long-term maintenance HEP at [place] Baseline: Started 11/18/205 Goal status: on going 05/25/2024   PLAN:  PT FREQUENCY: 2-3 times a week  PT DURATION: 12 weeks  PLANNED INTERVENTIONS: 97750- Physical Performance Testing, 97110-Therapeutic exercises, 97530- Therapeutic activity, [id number]- Neuromuscular re-education, 97535- Self Care, [id number]- Manual therapy, [phone]- Gait training, [phone]- Vasopneumatic device, Patient/Family education, Balance training, Stair training, Joint mobilization, Cryotherapy, and Moist heat  PLAN FOR NEXT SESSION:  Continued full active extension gains, progressive strengthening and balance improvements.    James Robbins, PT, DPT, OCS, ATC 05/25/2024  3:47 PM

## 2024-05-25 NOTE — Telephone Encounter (Signed)
 Just saw patient in office with Dr. Jerri. He is having trouble with pain at night and I asked Dr. Jerri about Tramadol  instead of anything stronger. Could you send that in? He asked me to send to you. Thanks.

## 2024-05-25 NOTE — Telephone Encounter (Signed)
 np

## 2024-05-27 ENCOUNTER — Encounter: Admitting: Rehabilitative and Restorative Service Providers"

## 2024-05-31 ENCOUNTER — Encounter: Payer: Self-pay | Admitting: Rehabilitative and Restorative Service Providers"

## 2024-05-31 ENCOUNTER — Ambulatory Visit: Admitting: Rehabilitative and Restorative Service Providers"

## 2024-05-31 DIAGNOSIS — M6281 Muscle weakness (generalized): Secondary | ICD-10-CM | POA: Diagnosis not present

## 2024-05-31 DIAGNOSIS — M25561 Pain in right knee: Secondary | ICD-10-CM

## 2024-05-31 DIAGNOSIS — R262 Difficulty in walking, not elsewhere classified: Secondary | ICD-10-CM

## 2024-05-31 DIAGNOSIS — M25661 Stiffness of right knee, not elsewhere classified: Secondary | ICD-10-CM | POA: Diagnosis not present

## 2024-05-31 DIAGNOSIS — R600 Localized edema: Secondary | ICD-10-CM

## 2024-05-31 NOTE — Therapy (Signed)
 " OUTPATIENT PHYSICAL THERAPY TREATMENT   Patient Name: James Robbins. MRN: [id number] DOB:Apr 19, 1951, 73 y.o., male Today's Date: 05/31/2024   END OF SESSION:  PT End of Session - 05/31/24 0809     Visit Number 11    Number of Visits 18    Date for Recertification  07/20/24    Authorization Type [employer] $25 copay    Authorization Time Period 05/26/2024- 07/21/2023    Authorization - Visit Number 1    Authorization - Number of Visits 10    Progress Note Due on Visit 20    PT Start Time 0804    PT Stop Time 0843    PT Time Calculation (min) 39 min    Activity Tolerance Patient tolerated treatment well    Behavior During Therapy WFL for tasks assessed/performed               Past Medical History:  Diagnosis Date   Arthritis    Atrial fibrillation (HCC)    Chronic kidney disease (CKD), stage I    proteinuria   Controlled type 2 diabetes mellitus with diabetic nephropathy (HCC)    Completed DSME 2017   Dysrhythmia    A. Fib   Hematuria 2014   s/p normal urological workup ([place])   Hyperlipidemia    Hypertension    Obesity    Seasonal allergic rhinitis    Sleep apnea    has cpap machine; does not use   Past Surgical History:  Procedure Laterality Date   CARDIOVERSION N/A 11/27/2020   Procedure: CARDIOVERSION;  Surgeon: Nahser, Aleene PARAS, MD;  Location: [facility] ENDOSCOPY;  Service: Cardiovascular;  Laterality: N/A;   CATARACT EXTRACTION Bilateral 08/2021   COLONOSCOPY  06/10/2004   per pt normal ([place])   TOTAL KNEE ARTHROPLASTY Left 06/16/2023   Procedure: LEFT TOTAL KNEE ARTHROPLASTY;  Surgeon: Jerri Kay HERO, MD;  Location: [facility] OR;  Service: Orthopedics;  Laterality: Left;   TOTAL KNEE ARTHROPLASTY Right 04/12/2024   Procedure: ARTHROPLASTY, KNEE, TOTAL;  Surgeon: Jerri Kay HERO, MD;  Location: [facility] OR;  Service: Orthopedics;  Laterality: Right;   VASECTOMY     VASECTOMY REVERSAL     Patient Active Problem List   Diagnosis Date Noted   Status post total right  knee replacement 04/12/2024   Primary osteoarthritis of right knee 04/11/2024   Skin rash 03/19/2024   Right kidney mass 03/19/2024   Multiple acquired cysts of kidney 03/19/2024   Diabetic nephropathy with proteinuria (HCC) 12/16/2023   Status post total left knee replacement 06/16/2023   Hypercoagulable state due to permanent atrial fibrillation (HCC) 01/01/2023   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) 01/01/2022   Hypertensive retinopathy of both eyes 01/01/2022   Vision loss of right eye 09/06/2021   Medicare annual wellness visit, subsequent 09/05/2021   Right retinal artery branch occlusion 04/05/2021   Obstructive sleep apnea hypopnea, severe 11/07/2020   Permanent atrial fibrillation (HCC) 11/07/2020   Primary osteoarthritis of knees, bilateral 04/23/2019   Vitamin D  deficiency 09/23/2016   Advanced care planning/counseling discussion 07/20/2015   Encounter for general adult medical examination with abnormal findings 02/21/2014   Severe obesity (BMI 35.0-39.9) with comorbidity (HCC)    Type 2 diabetes mellitus with persistent proteinuria (HCC)    Seasonal allergic rhinitis 12/24/2011   Hypertension 12/24/2011   Hyperlipidemia associated with type 2 diabetes mellitus (HCC) 12/24/2011    PCP: Anton Blas, MD  REFERRING PROVIDER: Kay HERO Jerri, MD  REFERRING DIAG: [phone] (ICD-10-CM) -  Status post total right knee replacement M17.11 (ICD-10-CM) - Primary osteoarthritis of right knee  THERAPY DIAG:  Difficulty in walking, not elsewhere classified  Muscle weakness (generalized)  Localized edema  Acute pain of right knee  Stiffness of right knee, not elsewhere classified  Rationale for Evaluation and Treatment: Rehabilitation  ONSET DATE: 04/12/2024 Rt TKA  SUBJECTIVE:   SUBJECTIVE STATEMENT: Pt indicated feeling pretty good.  Reported nighttime 1-2/10 but nothing to keep him awake.   PERTINENT HISTORY: 04/12/2024 Rt TKA, Atrial  fibrillation, Stage 1 CKD, Type 2 DM, HLD, HTN, obesity, previous Lt TKA in January 2025   PAIN:  NPRS scale:at night 1-2/10 Pain location: Rt knee Pain description: Achy Aggravating factors: Prolonged postures Relieving factors: Ice, tylenol , oxycodone  and a muscle relaxer  PRECAUTIONS: Other: See past medical history  RED FLAGS: None   WEIGHT BEARING RESTRICTIONS: No  FALLS:  Has patient fallen in last 6 months? No  LIVING ENVIRONMENT: Lives with: lives with their family and lives with their spouse Lives in: House/apartment Stairs: Manages with a handrail and some effort Has following equipment at home: Environmental Consultant - 2 wheeled  OCCUPATION: Retired  PLOF: Independent  PATIENT GOALS: Needs to get up and down 7 stairs now, will need to do more, 5 acres of yard work  OBJECTIVE:  Note: Objective measures were completed at Evaluation unless otherwise noted.  DIAGNOSTIC FINDINGS: IMPRESSION: 1. Immediate postoperative right total knee arthroplasty with components in anatomic alignment and no periprosthetic fracture or dislocation.  PATIENT SURVEYS:  PSFS: THE PATIENT SPECIFIC FUNCTIONAL SCALE  Place score of 0-10 (0 = unable to perform activity and 10 = able to perform activity at the same level as before injury or problem)  Activity Date: 04/27/2024 05/25/2024   Walking 4 3   2.  Working in the yard 3 3   3.  Taking out the trash 3 5   4.  Shopping 3 4   5.  Weed eating 3 0   Total Score 3.2 3.0   Total Score = Sum of activity scores/number of activities  Minimally Detectable Change: 3 points (for single activity); 2 points (for average score)  [facility] (nd). The Patient Specific Functional Scale . Retrieved from Skateoasis.com.pt   COGNITION: 04/27/2024 Overall cognitive status: Within functional limits for tasks assessed     SENSATION: 04/27/2024 [facility]  EDEMA:  04/27/2024 Expected  swelling   LOWER EXTREMITY ROM:  ROM Right 04/27/24 AROM Left 04/27/24 AROM Right 05/12/24 Right 05/25/2024  Hip flexion      Hip extension      Hip abduction      Hip adduction      Hip internal rotation      Hip external rotation      Knee flexion 92 129 A: 102* 120 AROM in supine heel slide  Knee extension - 7 - 2 Quad set -5* -9 AROM in seated LAQ   -5 in supine heel prop PROM  Ankle dorsiflexion      Ankle plantarflexion      Ankle inversion      Ankle eversion       (Blank rows = not tested)  LOWER EXTREMITY STRENGTH:   Right 04/27/2024 Left 04/27/2024 Right 05/19/2024 Right 05/25/2024  Hip flexion      Hip extension      Hip abduction      Hip adduction      Hip internal rotation      Hip external rotation  Knee flexion    5/5  Knee extension 6.9 lbs 37.6 lbs  23.2, 22.4 lbs 4/5 26, 24.8 lbs  Ankle dorsiflexion      Ankle plantarflexion      Ankle inversion      Ankle eversion       (Blank rows = not tested)  GAIT: 05/19/2024: Ambulation modified independent with SPC within clinic.   04/27/2024 Distance walked: 100 feet Assistive device utilized: Environmental Consultant - 2 wheeled Level of assistance: Modified independence Comments: Marcanthony was assistive device free before surgery                 TREATMENT      DATE:  05/31/2024 Therex: Nustep lvl 6 6 mins UE/LE for knee ROM Incline gastroc stretch 30 sec x 3 bilaterally   TherActivity (to improve stairs, squatting, transfers) Forward step up WB on Rt leg 6 inch step x 10 with Lt hand on bar Lateral step down WB on Rt leg 4 inch step x 10 with bilateral hand on bar for balance.  Leg press double leg 112 lb with slow lowering focus x 15  Leg press single leg 43 lbs 2 x 15, bilaterally    Neuro Re-ed: Tandem stance 1 min x 2 bilateral in // bars with occasional HHA on bar, SBA. Additional time spnet in cues for activity   Vaso: Pt deferred today    TREATMENT      DATE:   05/25/2024 Therex: UBE LE only lvl 3.5 8 mins, seat 12 Incline gastroc 30 sec hold x 5 bilaterally Supine heel slide Rt AROM 5 sec hold x 10  Supine bilateral SAQ with triangle wedge under knees 3 sec hold 2 x 10  Re-education on importance of TKE stretching for extension gains, focusing on toe up positioning without movement to tolerance.  Performed 1 min in clinic.    Neuro Re-ed: Tandem stance 1 min x 2 bilateral on foam in // bars with occasional to moderate HHA, SBA Tandem ambulation on ground in // bars forward 10 ft x 6 with occasional to moderate HHA on bars.    Vaso: 10 mins Rt knee medium compression 34 degrees in elevation.   TREATMENT      DATE: 05/20/24 TherAct Recumbent bike seat 6 x 8 min; L5 Leg press double leg 112 lbs 2x15 Leg press single leg 37 lbs 2x15 bilaterally  TherEx Seated LAQ on Rt with 5# 2x10; 5 sec hold Heel prop x 5 min with vaso  Neuro Re-Ed Sidestepping on foam x 5 laps with UE support Tandem walking forward/backward x 5 laps on foam with UE support SLS 5 x 10 sec hold bil; intermittent UE support   Modalities Vaso x 10 min; mod pressure to Rt knee, 34 deg    TREATMENT      DATE: 05/19/2024 Therex: Incline gastroc stretch 30 sec x 4 bilaterally  UBE LE only full revolutions, seat 11 8 mins  Seated Rt leg LAQ 3 lbs with end range pause 2 seconds 2 x 15   TherActivity: ( to improve transfers, stairs, squatting) Leg press double leg 112 lbs x 15 Leg press single leg 37 lbs 2 x 10 bilaterally   Manual Seated Rt knee flexion c distraction/IR mobilization with movement.    Vaso 10 mins Rt knee in elevation, medium compression , 34 degrees.  HOME EXERCISE PROGRAM: Access Code: [email or web address] URL: [email or web address] Date: 05/17/2024 Prepared by: Grayce Spatz  Exercises - Supine Quadricep Sets  - 5 x  daily - 7 x weekly - 2 sets - 10 reps - 5 second hold - Seated Knee Extension AROM  - 5 x daily - 7 x weekly - 1 sets - 10 reps - 5 seconds hold - Seated Knee Flexion AAROM  - 5 x daily - 7 x weekly - 1 sets - 10 reps - 5 seconds hold - Hip Flexion  - 5 x daily - 7 x weekly - 1 sets - 10 reps - 5 seconds hold  ASSESSMENT:  CLINICAL IMPRESSION: Pt to continue to benefit from progressive strengthening and balnace improvements to improve ambulation on even and uneven surfaces, stairs, transfers.    PARTICIPATION LIMITATIONS: driving, shopping, community activity, and yard work  PERSONAL FACTORS: Atrial fibrillation, Stage 1 CKD, Type 2 DM, HLD, HTN, obesity, previous Lt TKA in January 2025 are also affecting patient's functional outcome.   REHAB POTENTIAL: Good  CLINICAL DECISION MAKING: Evolving/moderate complexity  EVALUATION COMPLEXITY: Moderate   GOALS: Goals reviewed with patient? Yes  SHORT TERM GOALS: Target date: 06/08/2024  Lennart will be independent with his day 1 HEP  Goal status: Met 05/25/2024  2.  Improve right knee AROM to 0 - 3 - 105 degrees  Goal status: on going 05/25/2024  3.  Improve right quadriceps strength as assessed by safe transition to a single point cane  Goal status: Met 05/25/2024   LONG TERM GOALS: Target date: 07/16/2023  Improve Patient Specific Functional Score to 6 Baseline: 3.2 Goal status: on going 05/25/2024  2.  Kyland will report right knee pain consistently 3/10 or less on the Visual Analog Scale Baseline: 3-5/10 Goal status: on going 05/25/2024  3.  Improve knee AROM to 0 - 3 - 110 degrees or better Baseline: 0 - 7 -92 degrees Goal status: on going 05/25/2024  4.  Improve bilateral quadriceps strength to at least 40 pounds Baseline: 6.9 pounds on the right Goal status: on going 05/25/2024  5.  Tymier will be independent with his long-term maintenance HEP at [place] Baseline: Started 11/18/205 Goal status: on going  05/25/2024   PLAN:  PT FREQUENCY: 2-3 times a week  PT DURATION: 12 weeks  PLANNED INTERVENTIONS: 97750- Physical Performance Testing, 97110-Therapeutic exercises, 97530- Therapeutic activity, 97112- Neuromuscular re-education, 97535- Self Care, [id number]- Manual therapy, [phone]- Gait training, [phone]- Vasopneumatic device, Patient/Family education, Balance training, Stair training, Joint mobilization, Cryotherapy, and Moist heat  PLAN FOR NEXT SESSION:  TKE gains, WB strengthening, balance improvements.    Ozell Silvan, PT, DPT, OCS, ATC 05/31/2024  8:35 AM    "

## 2024-06-02 ENCOUNTER — Encounter: Admitting: Rehabilitative and Restorative Service Providers"

## 2024-06-07 ENCOUNTER — Encounter: Payer: Self-pay | Admitting: Rehabilitative and Restorative Service Providers"

## 2024-06-07 ENCOUNTER — Ambulatory Visit: Admitting: Rehabilitative and Restorative Service Providers"

## 2024-06-07 DIAGNOSIS — R262 Difficulty in walking, not elsewhere classified: Secondary | ICD-10-CM

## 2024-06-07 DIAGNOSIS — M6281 Muscle weakness (generalized): Secondary | ICD-10-CM | POA: Diagnosis not present

## 2024-06-07 DIAGNOSIS — R600 Localized edema: Secondary | ICD-10-CM | POA: Diagnosis not present

## 2024-06-07 DIAGNOSIS — M25561 Pain in right knee: Secondary | ICD-10-CM

## 2024-06-07 DIAGNOSIS — M25661 Stiffness of right knee, not elsewhere classified: Secondary | ICD-10-CM

## 2024-06-07 NOTE — Therapy (Signed)
 " OUTPATIENT PHYSICAL THERAPY TREATMENT   Patient Name: James Robbins. MRN: [id number] DOB:02-Jan-1951, 73 y.o., male Today's Date: 06/07/2024   END OF SESSION:  PT End of Session - 06/07/24 0821     Visit Number 12    Number of Visits 18    Date for Recertification  07/20/24    Authorization Type [employer] $25 copay    Authorization Time Period 05/26/2024- 07/21/2023    Authorization - Visit Number 2    Authorization - Number of Visits 10    Progress Note Due on Visit 20    PT Start Time 0802    PT Stop Time 0842    PT Time Calculation (min) 40 min    Activity Tolerance Patient tolerated treatment well    Behavior During Therapy WFL for tasks assessed/performed                Past Medical History:  Diagnosis Date   Arthritis    Atrial fibrillation (HCC)    Chronic kidney disease (CKD), stage I    proteinuria   Controlled type 2 diabetes mellitus with diabetic nephropathy (HCC)    Completed DSME 2017   Dysrhythmia    A. Fib   Hematuria 2014   s/p normal urological workup ([place])   Hyperlipidemia    Hypertension    Obesity    Seasonal allergic rhinitis    Sleep apnea    has cpap machine; does not use   Past Surgical History:  Procedure Laterality Date   CARDIOVERSION N/A 11/27/2020   Procedure: CARDIOVERSION;  Surgeon: Nahser, Aleene PARAS, MD;  Location: [facility] ENDOSCOPY;  Service: Cardiovascular;  Laterality: N/A;   CATARACT EXTRACTION Bilateral 08/2021   COLONOSCOPY  06/10/2004   per pt normal ([place])   TOTAL KNEE ARTHROPLASTY Left 06/16/2023   Procedure: LEFT TOTAL KNEE ARTHROPLASTY;  Surgeon: Jerri Kay HERO, MD;  Location: [facility] OR;  Service: Orthopedics;  Laterality: Left;   TOTAL KNEE ARTHROPLASTY Right 04/12/2024   Procedure: ARTHROPLASTY, KNEE, TOTAL;  Surgeon: Jerri Kay HERO, MD;  Location: [facility] OR;  Service: Orthopedics;  Laterality: Right;   VASECTOMY     VASECTOMY REVERSAL     Patient Active Problem List   Diagnosis Date Noted   Status post total right  knee replacement 04/12/2024   Primary osteoarthritis of right knee 04/11/2024   Skin rash 03/19/2024   Right kidney mass 03/19/2024   Multiple acquired cysts of kidney 03/19/2024   Diabetic nephropathy with proteinuria (HCC) 12/16/2023   Status post total left knee replacement 06/16/2023   Hypercoagulable state due to permanent atrial fibrillation (HCC) 01/01/2023   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) 01/01/2022   Hypertensive retinopathy of both eyes 01/01/2022   Vision loss of right eye 09/06/2021   Medicare annual wellness visit, subsequent 09/05/2021   Right retinal artery branch occlusion 04/05/2021   Obstructive sleep apnea hypopnea, severe 11/07/2020   Permanent atrial fibrillation (HCC) 11/07/2020   Primary osteoarthritis of knees, bilateral 04/23/2019   Vitamin D  deficiency 09/23/2016   Advanced care planning/counseling discussion 07/20/2015   Encounter for general adult medical examination with abnormal findings 02/21/2014   Severe obesity (BMI 35.0-39.9) with comorbidity (HCC)    Type 2 diabetes mellitus with persistent proteinuria (HCC)    Seasonal allergic rhinitis 12/24/2011   Hypertension 12/24/2011   Hyperlipidemia associated with type 2 diabetes mellitus (HCC) 12/24/2011    PCP: Anton Blas, MD  REFERRING PROVIDER: Kay HERO Jerri, MD  REFERRING DIAG: [phone] (ICD-10-CM) -  Status post total right knee replacement M17.11 (ICD-10-CM) - Primary osteoarthritis of right knee  THERAPY DIAG:  Difficulty in walking, not elsewhere classified  Muscle weakness (generalized)  Localized edema  Acute pain of right knee  Stiffness of right knee, not elsewhere classified  Rationale for Evaluation and Treatment: Rehabilitation  ONSET DATE: 04/12/2024 Rt TKA  SUBJECTIVE:   SUBJECTIVE STATEMENT: Pt indicated weekend was pretty good.   PERTINENT HISTORY: 04/12/2024 Rt TKA, Atrial fibrillation, Stage 1 CKD, Type 2 DM, HLD, HTN, obesity,  previous Lt TKA in January 2025   PAIN:  NPRS scale:at night 1-2/10 Pain location: Rt knee Pain description: Achy Aggravating factors: Prolonged postures Relieving factors: Ice, tylenol , oxycodone  and a muscle relaxer  PRECAUTIONS: Other: See past medical history  RED FLAGS: None   WEIGHT BEARING RESTRICTIONS: No  FALLS:  Has patient fallen in last 6 months? No  LIVING ENVIRONMENT: Lives with: lives with their family and lives with their spouse Lives in: House/apartment Stairs: Manages with a handrail and some effort Has following equipment at home: Environmental Consultant - 2 wheeled  OCCUPATION: Retired  PLOF: Independent  PATIENT GOALS: Needs to get up and down 7 stairs now, will need to do more, 5 acres of yard work  OBJECTIVE:  Note: Objective measures were completed at Evaluation unless otherwise noted.  DIAGNOSTIC FINDINGS: IMPRESSION: 1. Immediate postoperative right total knee arthroplasty with components in anatomic alignment and no periprosthetic fracture or dislocation.  PATIENT SURVEYS:  PSFS: THE PATIENT SPECIFIC FUNCTIONAL SCALE  Place score of 0-10 (0 = unable to perform activity and 10 = able to perform activity at the same level as before injury or problem)  Activity Date: 04/27/2024 05/25/2024   Walking 4 3   2.  Working in the yard 3 3   3.  Taking out the trash 3 5   4.  Shopping 3 4   5.  Weed eating 3 0   Total Score 3.2 3.0   Total Score = Sum of activity scores/number of activities  Minimally Detectable Change: 3 points (for single activity); 2 points (for average score)  [facility] (nd). The Patient Specific Functional Scale . Retrieved from Skateoasis.com.pt   COGNITION: 04/27/2024 Overall cognitive status: Within functional limits for tasks assessed     SENSATION: 04/27/2024 [facility]  EDEMA:  04/27/2024 Expected swelling   LOWER EXTREMITY ROM:  ROM Right 04/27/24 AROM  Left 04/27/24 AROM Right 05/12/24 Right 05/25/2024  Hip flexion      Hip extension      Hip abduction      Hip adduction      Hip internal rotation      Hip external rotation      Knee flexion 92 129 A: 102* 120 AROM in supine heel slide  Knee extension - 7 - 2 Quad set -5* -9 AROM in seated LAQ   -5 in supine heel prop PROM  Ankle dorsiflexion      Ankle plantarflexion      Ankle inversion      Ankle eversion       (Blank rows = not tested)  LOWER EXTREMITY STRENGTH:   Right 04/27/2024 Left 04/27/2024 Right 05/19/2024 Right 05/25/2024 Right 06/07/2024 Left 06/07/2024  Hip flexion        Hip extension        Hip abduction        Hip adduction        Hip internal rotation  Hip external rotation        Knee flexion    5/5    Knee extension 6.9 lbs 37.6 lbs  23.2, 22.4 lbs 4/5 26, 24.8 lbs 40, 37 lbs 50, 53 lbs  Ankle dorsiflexion        Ankle plantarflexion        Ankle inversion        Ankle eversion         (Blank rows = not tested)  GAIT: 05/19/2024: Ambulation modified independent with SPC within clinic.   04/27/2024 Distance walked: 100 feet Assistive device utilized: Environmental Consultant - 2 wheeled Level of assistance: Modified independence Comments: James Robbins was assistive device free before surgery                 TREATMENT      DATE:  06/07/2024 Therex: Recumbent bike lvl 3 8.5 mins for ROM, seat 6. Incline gastroc stretch 30 sec x 3 bilateral  Knee extension machine double leg up, Rt leg lowering slowly 10 lbs 2 x 10  Supine quad set in heel prop 5 sec hold x 10 bilaterally concurrently  Supine SAQ over triangle black wedge 3 sec hold x 10 bilaterally performed concurrently    TherActivity (to improve stairs, squatting, transfers) Leg press double leg 112 lb with slow lowering focus x 15  Leg press single leg 50 lbs 2 x 15, bilaterally    TREATMENT      DATE:  05/31/2024 Therex: Nustep lvl 6 6 mins UE/LE for knee ROM Incline gastroc stretch 30  sec x 3 bilaterally   TherActivity (to improve stairs, squatting, transfers) Forward step up WB on Rt leg 6 inch step x 10 with Lt hand on bar Lateral step down WB on Rt leg 4 inch step x 10 with bilateral hand on bar for balance.  Leg press double leg 112 lb with slow lowering focus x 15  Leg press single leg 43 lbs 2 x 15, bilaterally    Neuro Re-ed: Tandem stance 1 min x 2 bilateral in // bars with occasional HHA on bar, SBA. Additional time spnet in cues for activity   Vaso: Pt deferred today    TREATMENT      DATE:  05/25/2024 Therex: UBE LE only lvl 3.5 8 mins, seat 12 Incline gastroc 30 sec hold x 5 bilaterally Supine heel slide Rt AROM 5 sec hold x 10  Supine bilateral SAQ with triangle wedge under knees 3 sec hold 2 x 10  Re-education on importance of TKE stretching for extension gains, focusing on toe up positioning without movement to tolerance.  Performed 1 min in clinic.    Neuro Re-ed: Tandem stance 1 min x 2 bilateral on foam in // bars with occasional to moderate HHA, SBA Tandem ambulation on ground in // bars forward 10 ft x 6 with occasional to moderate HHA on bars.    Vaso: 10 mins Rt knee medium compression 34 degrees in elevation.   TREATMENT      DATE: 05/20/24 TherAct Recumbent bike seat 6 x 8 min; L5 Leg press double leg 112 lbs 2x15 Leg press single leg 37 lbs 2x15 bilaterally  TherEx Seated LAQ on Rt with 5# 2x10; 5 sec hold Heel prop x 5 min with vaso  Neuro Re-Ed Sidestepping on foam x 5 laps with UE support Tandem walking forward/backward x 5 laps on foam with UE support SLS 5 x 10 sec hold bil; intermittent UE support   Modalities Vaso x  10 min; mod pressure to Rt knee, 34 deg                                                                                                                     HOME EXERCISE PROGRAM: Access Code: [email or web address] URL: [email or web address] Date: 05/17/2024 Prepared by: James Robbins  Exercises - Supine Quadricep Sets  - 5 x daily - 7 x weekly - 2 sets - 10 reps - 5 second hold - Seated Knee Extension AROM  - 5 x daily - 7 x weekly - 1 sets - 10 reps - 5 seconds hold - Seated Knee Flexion AAROM  - 5 x daily - 7 x weekly - 1 sets - 10 reps - 5 seconds hold - Hip Flexion  - 5 x daily - 7 x weekly - 1 sets - 10 reps - 5 seconds hold  ASSESSMENT:  CLINICAL IMPRESSION: Strength testing updated showed improvement bilaterally compared to previous data.  Rt leg still less than Lt at this time.  Continued intervention to promote strength gains.    PARTICIPATION LIMITATIONS: driving, shopping, community activity, and yard work  PERSONAL FACTORS: Atrial fibrillation, Stage 1 CKD, Type 2 DM, HLD, HTN, obesity, previous Lt TKA in January 2025 are also affecting patient's functional outcome.   REHAB POTENTIAL: Good  CLINICAL DECISION MAKING: Evolving/moderate complexity  EVALUATION COMPLEXITY: Moderate   GOALS: Goals reviewed with patient? Yes  SHORT TERM GOALS: Target date: 06/08/2024  James Robbins will be independent with his day 1 HEP  Goal status: Met 05/25/2024  2.  Improve right knee AROM to 0 - 3 - 105 degrees  Goal status: on going 05/25/2024  3.  Improve right quadriceps strength as assessed by safe transition to a single point cane  Goal status: Met 05/25/2024   LONG TERM GOALS: Target date: 07/16/2023  Improve Patient Specific Functional Score to 6 Baseline: 3.2 Goal status: on going 05/25/2024  2.  James Robbins will report right knee pain consistently 3/10 or less on the Visual Analog Scale Baseline: 3-5/10 Goal status: on going 05/25/2024  3.  Improve knee AROM to 0 - 3 - 110 degrees or better Baseline: 0 - 7 -92 degrees Goal status: on going 05/25/2024  4.  Improve bilateral quadriceps strength to at least 40 pounds Baseline: 6.9 pounds on the right Goal status: on going 05/25/2024  5.  James Robbins will be independent with his long-term  maintenance HEP at [place] Baseline: Started 11/18/205 Goal status: on going 05/25/2024   PLAN:  PT FREQUENCY: 2-3 times a week  PT DURATION: 12 weeks  PLANNED INTERVENTIONS: 97750- Physical Performance Testing, 97110-Therapeutic exercises, 97530- Therapeutic activity, 97112- Neuromuscular re-education, 97535- Self Care, 02859- Manual therapy, [phone]- Gait training, [phone]- Vasopneumatic device, Patient/Family education, Balance training, Stair training, Joint mobilization, Cryotherapy, and Moist heat  PLAN FOR NEXT SESSION:  Extension mobility gains, functional strengthening progression.  Balance for improvement away from [facility].    James Robbins, PT, DPT, OCS, ATC 06/07/2024  8:43 AM    "

## 2024-06-09 ENCOUNTER — Encounter: Admitting: Rehabilitative and Restorative Service Providers"

## 2024-06-09 NOTE — Telephone Encounter (Signed)
 Gave [employer] a call to follow up on pt application spoke with [employer] representative explain pt has been approved thru 06/09/2025.

## 2024-06-14 ENCOUNTER — Encounter: Payer: Self-pay | Admitting: Physical Therapy

## 2024-06-14 ENCOUNTER — Encounter: Admitting: Physical Therapy

## 2024-06-14 DIAGNOSIS — M25561 Pain in right knee: Secondary | ICD-10-CM | POA: Diagnosis not present

## 2024-06-14 DIAGNOSIS — R600 Localized edema: Secondary | ICD-10-CM

## 2024-06-14 DIAGNOSIS — M25661 Stiffness of right knee, not elsewhere classified: Secondary | ICD-10-CM | POA: Diagnosis not present

## 2024-06-14 DIAGNOSIS — R262 Difficulty in walking, not elsewhere classified: Secondary | ICD-10-CM

## 2024-06-14 DIAGNOSIS — M6281 Muscle weakness (generalized): Secondary | ICD-10-CM

## 2024-06-14 NOTE — Therapy (Addendum)
 " OUTPATIENT PHYSICAL THERAPY TREATMENT   Patient Name: James Robbins. MRN: [id number] DOB:[birth year], 74 y.o., male Today's Date: 06/14/2024   END OF SESSION:  PT End of Session - 06/14/24 1520     Visit Number 13    Number of Visits 18    Date for Recertification  07/20/24    Authorization Type [employer] $25 copay    Authorization Time Period 05/26/2024- 07/21/2023    Authorization - Number of Visits 10    Progress Note Due on Visit 20    PT Start Time 1515    PT Stop Time 1554    PT Time Calculation (min) 39 min    Activity Tolerance Patient tolerated treatment well    Behavior During Therapy WFL for tasks assessed/performed                 Past Medical History:  Diagnosis Date   Arthritis    Atrial fibrillation (HCC)    Chronic kidney disease (CKD), stage I    proteinuria   Controlled type 2 diabetes mellitus with diabetic nephropathy (HCC)    Completed DSME 2017   Dysrhythmia    A. Fib   Hematuria 2014   s/p normal urological workup ([place])   Hyperlipidemia    Hypertension    Obesity    Seasonal allergic rhinitis    Sleep apnea    has cpap machine; does not use   Past Surgical History:  Procedure Laterality Date   CARDIOVERSION N/A 11/27/2020   Procedure: CARDIOVERSION;  Surgeon: Nahser, Aleene PARAS, MD;  Location: [facility] ENDOSCOPY;  Service: Cardiovascular;  Laterality: N/A;   CATARACT EXTRACTION Bilateral 08/2021   COLONOSCOPY  06/10/2004   per pt normal ([place])   TOTAL KNEE ARTHROPLASTY Left 06/16/2023   Procedure: LEFT TOTAL KNEE ARTHROPLASTY;  Surgeon: Jerri Kay HERO, MD;  Location: [facility] OR;  Service: Orthopedics;  Laterality: Left;   TOTAL KNEE ARTHROPLASTY Right 04/12/2024   Procedure: ARTHROPLASTY, KNEE, TOTAL;  Surgeon: Jerri Kay HERO, MD;  Location: [facility] OR;  Service: Orthopedics;  Laterality: Right;   VASECTOMY     VASECTOMY REVERSAL     Patient Active Problem List   Diagnosis Date Noted   Status post total right knee replacement 04/12/2024    Primary osteoarthritis of right knee 04/11/2024   Skin rash 03/19/2024   Right kidney mass 03/19/2024   Multiple acquired cysts of kidney 03/19/2024   Diabetic nephropathy with proteinuria (HCC) 12/16/2023   Status post total left knee replacement 06/16/2023   Hypercoagulable state due to permanent atrial fibrillation (HCC) 01/01/2023   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) 01/01/2022   Hypertensive retinopathy of both eyes 01/01/2022   Vision loss of right eye 09/06/2021   Medicare annual wellness visit, subsequent 09/05/2021   Right retinal artery branch occlusion 04/05/2021   Obstructive sleep apnea hypopnea, severe 11/07/2020   Permanent atrial fibrillation (HCC) 11/07/2020   Primary osteoarthritis of knees, bilateral 04/23/2019   Vitamin D  deficiency 09/23/2016   Advanced care planning/counseling discussion 07/20/2015   Encounter for general adult medical examination with abnormal findings 02/21/2014   Severe obesity (BMI 35.0-39.9) with comorbidity (HCC)    Type 2 diabetes mellitus with persistent proteinuria (HCC)    Seasonal allergic rhinitis 12/24/2011   Hypertension 12/24/2011   Hyperlipidemia associated with type 2 diabetes mellitus (HCC) 12/24/2011    PCP: Anton Blas, MD  REFERRING PROVIDER: Kay HERO Jerri, MD  REFERRING DIAG: [phone] (ICD-10-CM) - Status post total right knee replacement  M17.11 (ICD-10-CM) - Primary osteoarthritis of right knee  THERAPY DIAG:  Difficulty in walking, not elsewhere classified  Muscle weakness (generalized)  Localized edema  Acute pain of right knee  Stiffness of right knee, not elsewhere classified  Rationale for Evaluation and Treatment: Rehabilitation  ONSET DATE: 04/12/2024 Rt TKA  SUBJECTIVE:   SUBJECTIVE STATEMENT: Pt states that knee has been aching. Exercise on bike at home is going well.   PERTINENT HISTORY: 04/12/2024 Rt TKA, Atrial fibrillation, Stage 1 CKD, Type 2 DM, HLD, HTN,  obesity, previous Lt TKA in January 2025   PAIN:  NPRS scale: Lowest 1/10; Highest 2/10 Pain location: Rt knee Pain description: Achy Aggravating factors: Prolonged postures Relieving factors: Ice, tylenol , oxycodone  and a muscle relaxer  PRECAUTIONS: Other: See past medical history  RED FLAGS: None   WEIGHT BEARING RESTRICTIONS: No  FALLS:  Has patient fallen in last 6 months? No  LIVING ENVIRONMENT: Lives with: lives with their family and lives with their spouse Lives in: House/apartment Stairs: Manages with a handrail and some effort Has following equipment at home: Environmental Consultant - 2 wheeled  OCCUPATION: Retired  PLOF: Independent  PATIENT GOALS: Needs to get up and down 7 stairs now, will need to do more, 5 acres of yard work  OBJECTIVE:  Note: Objective measures were completed at Evaluation unless otherwise noted.  DIAGNOSTIC FINDINGS: IMPRESSION: 1. Immediate postoperative right total knee arthroplasty with components in anatomic alignment and no periprosthetic fracture or dislocation.  PATIENT SURVEYS:  PSFS: THE PATIENT SPECIFIC FUNCTIONAL SCALE  Place score of 0-10 (0 = unable to perform activity and 10 = able to perform activity at the same level as before injury or problem)  Activity Date: 04/27/2024 05/25/2024 06/14/24  Walking 4 3 5   2.  Working in the yard 3 3 4   3.  Taking out the trash 3 5 7   4.  Shopping 3 4 7   5.  Weed eating 3 0 5  Total Score 3.2 3.0 5.6  Total Score = Sum of activity scores/number of activities  Minimally Detectable Change: 3 points (for single activity); 2 points (for average score)  [facility] (nd). The Patient Specific Functional Scale . Retrieved from [email or web address]   COGNITION: 04/27/2024 Overall cognitive status: Within functional limits for tasks assessed     SENSATION: 04/27/2024 [facility]  EDEMA:  04/27/2024 Expected swelling   LOWER EXTREMITY  ROM:  ROM Right 04/27/24 AROM Left 04/27/24 AROM Right 05/12/24 Right 05/25/2024  Hip flexion      Hip extension      Hip abduction      Hip adduction      Hip internal rotation      Hip external rotation      Knee flexion 92 129 A: 102* 120 AROM in supine heel slide  Knee extension - 7 - 2 Quad set -5* -9 AROM in seated LAQ   -5 in supine heel prop PROM  Ankle dorsiflexion      Ankle plantarflexion      Ankle inversion      Ankle eversion       (Blank rows = not tested)  LOWER EXTREMITY STRENGTH:   Right 04/27/2024 Left 04/27/2024 Right 05/19/2024 Right 05/25/2024 Right 06/07/2024 Left 06/07/2024  Hip flexion        Hip extension        Hip abduction        Hip adduction        Hip internal rotation  Hip external rotation        Knee flexion    5/5    Knee extension 6.9 lbs 37.6 lbs  23.2, 22.4 lbs 4/5 26, 24.8 lbs 40, 37 lbs 50, 53 lbs  Ankle dorsiflexion        Ankle plantarflexion        Ankle inversion        Ankle eversion         (Blank rows = not tested)  GAIT: 05/19/2024: Ambulation modified independent with SPC within clinic.   04/27/2024 Distance walked: 100 feet Assistive device utilized: Environmental Consultant - 2 wheeled Level of assistance: Modified independence Comments: Yahel was assistive device free before surgery                  TODAY'S TREATMENT      DATE:  06/15/2023 Therex: Recumbent bike lvl 3 8.5 mins for ROM, seat 6. Incline gastroc stretch 30 sec x 3 bilateral  Knee extension machine double leg up, Rt leg lowering slowly 10 lbs 2 x 10  Hamstring Curl machine BLEs 35 lbs 2 x 10   TherActivity (to improve stairs, squatting, transfers) Leg press double leg 118 lb with slow lowering focus x 15  Leg press single leg 56 lbs 2 x 15, bilaterally  Stairs PT demos and verbal cues on technique, all with 2 rails - pt descended 5 steps, step to pattern using RLE and 6 steps alternating pattern; ascended alternating pattern. Pt. required  verbal cues but had knee stability.    TREATMENT      DATE:  06/07/2024 Therex: Recumbent bike lvl 3 8.5 mins for ROM, seat 6. Incline gastroc stretch 30 sec x 3 bilateral  Knee extension machine double leg up, Rt leg lowering slowly 10 lbs 2 x 10  Supine quad set in heel prop 5 sec hold x 10 bilaterally concurrently  Supine SAQ over triangle black wedge 3 sec hold x 10 bilaterally performed concurrently    TherActivity (to improve stairs, squatting, transfers) Leg press double leg 112 lb with slow lowering focus x 15  Leg press single leg 50 lbs 2 x 15, bilaterally    TREATMENT      DATE:  05/31/2024 Therex: Nustep lvl 6 6 mins UE/LE for knee ROM Incline gastroc stretch 30 sec x 3 bilaterally   TherActivity (to improve stairs, squatting, transfers) Forward step up WB on Rt leg 6 inch step x 10 with Lt hand on bar Lateral step down WB on Rt leg 4 inch step x 10 with bilateral hand on bar for balance.  Leg press double leg 112 lb with slow lowering focus x 15  Leg press single leg 43 lbs 2 x 15, bilaterally    Neuro Re-ed: Tandem stance 1 min x 2 bilateral in // bars with occasional HHA on bar, SBA. Additional time spnet in cues for activity   Vaso: Pt deferred today    TREATMENT      DATE:  05/25/2024 Therex: UBE LE only lvl 3.5 8 mins, seat 12 Incline gastroc 30 sec hold x 5 bilaterally Supine heel slide Rt AROM 5 sec hold x 10  Supine bilateral SAQ with triangle wedge under knees 3 sec hold 2 x 10  Re-education on importance of TKE stretching for extension gains, focusing on toe up positioning without movement to tolerance.  Performed 1 min in clinic.    Neuro Re-ed: Tandem stance 1 min x 2 bilateral on foam in // bars with  occasional to moderate HHA, SBA Tandem ambulation on ground in // bars forward 10 ft x 6 with occasional to moderate HHA on bars.    Vaso: 10 mins Rt knee medium compression 34 degrees in elevation.                                                                                                                  HOME EXERCISE PROGRAM: Access Code: [email or web address] URL: [email or web address] Date: 05/17/2024 Prepared by: Grayce Spatz  Exercises - Supine Quadricep Sets  - 5 x daily - 7 x weekly - 2 sets - 10 reps - 5 second hold - Seated Knee Extension AROM  - 5 x daily - 7 x weekly - 1 sets - 10 reps - 5 seconds hold - Seated Knee Flexion AAROM  - 5 x daily - 7 x weekly - 1 sets - 10 reps - 5 seconds hold - Hip Flexion  - 5 x daily - 7 x weekly - 1 sets - 10 reps - 5 seconds hold  ASSESSMENT:  CLINICAL IMPRESSION: Increase in weight on leg press machine showed strength improvement compared to last session. Decreased knee extension Rt>Lt on knee extension machine with increased fatigue on the second set. Patient demonstrates understanding of stair patterning during descent and ascent with use of rails for home. Patient will benefit from continued skilled physical therapy services to improve knee range of motion, balance, BLE strength, and stair negotiation for safe household and community ambulation.    PARTICIPATION LIMITATIONS: driving, shopping, community activity, and yard work  PERSONAL FACTORS: Atrial fibrillation, Stage 1 CKD, Type 2 DM, HLD, HTN, obesity, previous Lt TKA in January 2025 are also affecting patient's functional outcome.   REHAB POTENTIAL: Good  CLINICAL DECISION MAKING: Evolving/moderate complexity  EVALUATION COMPLEXITY: Moderate   GOALS: Goals reviewed with patient? Yes  SHORT TERM GOALS: Target date: 06/08/2024  Hussam will be independent with his day 1 HEP  Goal status: Met 05/25/2024  2.  Improve right knee AROM to 0 - 3 - 105 degrees  Goal status: on going 05/25/2024  3.  Improve right quadriceps strength as assessed by safe transition to a single point cane  Goal status: Met 05/25/2024   LONG TERM GOALS: Target date: 07/16/2023  Improve Patient Specific Functional Score to  6 Baseline: 3.2 Goal status: Ongoing 06/14/2024  2.  Thaine will report right knee pain consistently 3/10 or less on the Visual Analog Scale Baseline: 3-5/10 Goal status: Ongoing 06/14/2024  3.  Improve knee AROM to 0 - 3 - 110 degrees or better Baseline: 0 - 7 -92 degrees Goal status: Ongoing  06/14/2024  4.  Improve bilateral quadriceps strength to at least 40 pounds Baseline: 6.9 pounds on the right Goal status: Ongoing 06/14/2024  5.  Kauan will be independent with his long-term maintenance HEP at [place] Baseline: Started 11/18/205 Goal status: Ongoing 06/14/2024   PLAN:  PT FREQUENCY: 2-3 times a week  PT DURATION: 12 weeks  PLANNED INTERVENTIONS: 97750- Physical Performance Testing,  97110-Therapeutic exercises, 97530- Therapeutic activity, [id number]- Neuromuscular re-education, 97535- Self Care, [id number]- Manual therapy, [phone]- Gait training, [phone]- Vasopneumatic device, Patient/Family education, Balance training, Stair training, Joint mobilization, Cryotherapy, and Moist heat  PLAN FOR NEXT SESSION: Extension mobility gains, functional strengthening progression. Balance for improvement away from [facility]. Progress Stair negotiation to single rail.    Sherline Babe, SPT 06/14/2024, 4:31 PM  This entire session of physical therapy was performed under the direct supervision of PT signing evaluation /treatment. PT reviewed note and agrees.   Robin Waldron, PT, DPT 06/14/2024, 4:31 PM   "

## 2024-06-18 ENCOUNTER — Ambulatory Visit: Admitting: Family Medicine

## 2024-06-18 ENCOUNTER — Encounter: Payer: Self-pay | Admitting: Family Medicine

## 2024-06-18 VITALS — BP 120/60 | HR 78 | Temp 97.5°F | Ht 66.0 in | Wt 220.0 lb

## 2024-06-18 DIAGNOSIS — E1129 Type 2 diabetes mellitus with other diabetic kidney complication: Secondary | ICD-10-CM | POA: Diagnosis not present

## 2024-06-18 DIAGNOSIS — Z7985 Long-term (current) use of injectable non-insulin antidiabetic drugs: Secondary | ICD-10-CM

## 2024-06-18 DIAGNOSIS — Z96652 Presence of left artificial knee joint: Secondary | ICD-10-CM

## 2024-06-18 DIAGNOSIS — R809 Proteinuria, unspecified: Secondary | ICD-10-CM

## 2024-06-18 DIAGNOSIS — I10 Essential (primary) hypertension: Secondary | ICD-10-CM | POA: Diagnosis not present

## 2024-06-18 DIAGNOSIS — Z7984 Long term (current) use of oral hypoglycemic drugs: Secondary | ICD-10-CM | POA: Diagnosis not present

## 2024-06-18 DIAGNOSIS — Z96651 Presence of right artificial knee joint: Secondary | ICD-10-CM | POA: Diagnosis not present

## 2024-06-18 DIAGNOSIS — E559 Vitamin D deficiency, unspecified: Secondary | ICD-10-CM | POA: Diagnosis not present

## 2024-06-18 DIAGNOSIS — N2889 Other specified disorders of kidney and ureter: Secondary | ICD-10-CM

## 2024-06-18 DIAGNOSIS — E1121 Type 2 diabetes mellitus with diabetic nephropathy: Secondary | ICD-10-CM

## 2024-06-18 LAB — POCT GLYCOSYLATED HEMOGLOBIN (HGB A1C): Hemoglobin A1C: 5.5 % (ref 4.0–5.6)

## 2024-06-18 MED ORDER — VITAMIN D3 25 MCG (1000 UT) PO CAPS: 1000.0000 [IU] | ORAL_CAPSULE | Freq: Every day | ORAL | Status: AC

## 2024-06-18 NOTE — Assessment & Plan Note (Signed)
 Vit d dose dropped to 1000 units daily.

## 2024-06-18 NOTE — Assessment & Plan Note (Signed)
Appreciate nephrology care.

## 2024-06-18 NOTE — Assessment & Plan Note (Signed)
 Chronic, stable on current regimen - continue.

## 2024-06-18 NOTE — Assessment & Plan Note (Addendum)
 1.1cm complex exophytic hypoechoic lesion noted to R kidney - radiology recommended MRI which I previously ordered. # provided to call for appt. Discussed alternatively f/u with nephrologist for their recommendations if able to get in soon.

## 2024-06-18 NOTE — Assessment & Plan Note (Addendum)
 Chronic, great control on current regimen of metformin  500mg  bid, trulicity  1.5mg  weekly, and farxiga  10mg  daily - continue He has established with nephrology Dr Tobie for persistent proteinuria - encouraged f/u due Reviewed fasting and postprandial glycemic goals.  Provided with 15-15 hypoglycemia rule.

## 2024-06-18 NOTE — Patient Instructions (Addendum)
 Follow up with Dr Tobie kidney doctor Schedule abdominal MRI at [facility] imaging [phone]   Goal fasting sugars 80-120 Goal sugars 2 hours after meal <180 Too low if <70 - let me know if having recurrent lows.   The 15-15 rule for low sugars: If sugar reading below 70, have 15 grams of carbohydrate to raise your blood sugar and check it after 15 minutes. If its still below 70 mg/dL, have another serving. 15 grams of carbs may be: -Glucose tablets (see instructions) -Gel tube (see instructions) -4 ounces (1/2 cup) of juice or regular soda (not diet) -1 tablespoon of sugar, honey, or corn syrup -Hard candies, jellybeans or gumdrops--see food label for how many to consume  Repeat these steps until your blood sugar is at least 70 mg/dL. Once your blood sugar is back to normal, eat a meal or snack to make sure it doesnt lower again.

## 2024-06-18 NOTE — Assessment & Plan Note (Signed)
 Stable period on trulicity  1.5mg  weekly - continue

## 2024-06-18 NOTE — Progress Notes (Signed)
 " Ph: [phone] Fax: [phone]   Patient ID: James Robbins., male    DOB: 1950/06/23, 74 y.o.   MRN: [id number]  This visit was conducted in person.  BP 120/60 (BP Location: Right Arm, Cuff Size: Large)   Pulse 78   Temp (!) 97.5 F (36.4 C) (Oral)   Ht 5' 6 (1.676 m)   Wt 220 lb (99.8 kg)   SpO2 94%   BMI 35.51 kg/m    CC: DM f/u visit  Subjective:   HPI: James Robbins. is a 74 y.o. male presenting on 06/18/2024 for Medical Management of Chronic Issues (DM FU/Blood sugars at home stay below 86-110 at home/)   DM - does regularly check sugars 80s-110 fasting. Compliant with antihyperglycemic regimen which includes: trulicity  1.5mg  weekly, farxiga  10mg  daily, metformin  500mg  bid. Denies low sugars or hypoglycemic symptoms. Denies paresthesias, blurry vision. Last diabetic eye exam 12/2023. Glucometer brand: true metrix. Last foot exam: DUE - today. DSME: 2017. Lab Results  Component Value Date   HGBA1C 5.5 06/18/2024   Diabetic Foot Exam - Simple   Simple Foot Form Diabetic Foot exam was performed with the following findings: Yes 06/18/2024  4:56 PM  Visual Inspection No deformities, no ulcerations, no other skin breakdown bilaterally: Yes Sensation Testing See comments: Yes Pulse Check Posterior Tibialis and Dorsalis pulse intact bilaterally: Yes Comments No claudication Diminished sensation to bilateral soles to monofilament testing    Lab Results  Component Value Date   MICROALBUR 30.1 (H) 12/17/2023    HTN - continues enalapril  20mg  daily, Toprol  XL 42m daily, and amlodipine  10mg  daily.   Completed L knee replacement 06/2023 and latest R knee replacement 04/12/2024. He has been going to PT regularly.   Skin rash to R thigh/posterior leg - fading with clotrimazole use.   Established with nephrology Dr Tobie - needs to reschedule 3 mo f/u.      Relevant past medical, surgical, family and social history reviewed and updated as indicated. Interim  medical history since our last visit reviewed. Allergies and medications reviewed and updated. Outpatient Medications Prior to Visit  Medication Sig Dispense Refill   Cholecalciferol (VITAMIN D3) 25 MCG (1000 UT) CAPS Take 2 capsules (2,000 Units total) by mouth daily. (Patient taking differently: Take 1,000 Units by mouth daily.)     amLODipine  (NORVASC ) 10 MG tablet Take 1 tablet (10 mg total) by mouth daily. 90 tablet 3   apixaban  (ELIQUIS ) 5 MG TABS tablet Take 1 tablet by mouth twice daily 180 tablet 3   atorvastatin  (LIPITOR) 40 MG tablet Take 1 tablet (40 mg total) by mouth daily. 90 tablet 3   Blood Glucose Monitoring Suppl (TRUE METRIX METER) w/Device KIT Use as instructed to check blood sugar once a day 1 kit 0   cetirizine (ZYRTEC) 10 MG tablet Take 10 mg by mouth daily.     dapagliflozin  propanediol (FARXIGA ) 10 MG TABS tablet Take 1 tablet (10 mg total) by mouth daily before breakfast. 30 tablet 11   diclofenac Sodium (VOLTAREN) 1 % GEL Apply 1 application topically 4 (four) times daily as needed (pain).     Dulaglutide  (TRULICITY ) 1.5 MG/0.5ML SOAJ Inject 1.5 mg into the skin once a week. 2 mL 6   enalapril  (VASOTEC ) 20 MG tablet Take 1 tablet (20 mg total) by mouth daily. 90 tablet 3   fluticasone (FLONASE) 50 MCG/ACT nasal spray Place 1 spray into both nostrils at bedtime.     glucose blood (TRUE  METRIX BLOOD GLUCOSE TEST) test strip Use to test once a day DX E11.6 100 each 12   metFORMIN  (GLUCOPHAGE ) 500 MG tablet Take 1 tablet (500 mg total) by mouth 2 (two) times daily with a meal. 180 tablet 1   metoprolol  succinate (TOPROL -XL) 50 MG 24 hr tablet TAKE 1 TABLET BY MOUTH ONCE DAILY WITH MEAL OR  IMMEDIATELY  FOLLOWING  A  MEAL 90 tablet 3   Tuberculin-Allergy Syringes (ALLERGY SYRINGE 1CC/27GX1/2) 27G X 1/2 1 ML MISC See admin instructions.     docusate sodium  (COLACE) 100 MG capsule Take 1 capsule (100 mg total) by mouth daily as needed. (Patient not taking: Reported on  06/18/2024) 30 capsule 2   doxycycline  (VIBRAMYCIN ) 100 MG capsule Take 1 capsule (100 mg total) by mouth 2 (two) times daily. To be taken after surgery (Patient not taking: Reported on 06/18/2024) 20 capsule 0   HYDROcodone -acetaminophen  (NORCO/VICODIN) 5-325 MG tablet Take 1-2 tablets by mouth daily as needed for moderate pain (pain score 4-6). (Patient not taking: Reported on 06/18/2024) 20 tablet 0   methocarbamol  (ROBAXIN ) 750 MG tablet Take 1 tablet (750 mg total) by mouth 3 (three) times daily as needed. (Patient not taking: Reported on 06/18/2024) 30 tablet 2   ondansetron  (ZOFRAN ) 4 MG tablet Take 1 tablet (4 mg total) by mouth every 8 (eight) hours as needed for nausea or vomiting. (Patient not taking: Reported on 06/18/2024) 40 tablet 0   oxyCODONE -acetaminophen  (PERCOCET) 5-325 MG tablet Take 1-2 tablets by mouth every 8 (eight) hours as needed. To be taken after surgery 40 tablet 0   traMADol  (ULTRAM ) 50 MG tablet Take 1 tablet (50 mg total) by mouth 2 (two) times daily as needed. 30 tablet 0   No facility-administered medications prior to visit.     Per HPI unless specifically indicated in ROS section below Review of Systems  Objective:  BP 120/60 (BP Location: Right Arm, Cuff Size: Large)   Pulse 78   Temp (!) 97.5 F (36.4 C) (Oral)   Ht 5' 6 (1.676 m)   Wt 220 lb (99.8 kg)   SpO2 94%   BMI 35.51 kg/m   Wt Readings from Last 3 Encounters:  06/18/24 220 lb (99.8 kg)  04/12/24 222 lb 4.8 oz (100.8 kg)  04/07/24 223 lb 3.2 oz (101.2 kg)      Physical Exam Vitals and nursing note reviewed.  Constitutional:      Appearance: Normal appearance. He is not ill-appearing.  HENT:     Head: Normocephalic and atraumatic.     Mouth/Throat:     Mouth: Mucous membranes are moist.     Pharynx: Oropharynx is clear. No oropharyngeal exudate or posterior oropharyngeal erythema.  Eyes:     Extraocular Movements: Extraocular movements intact.     Conjunctiva/sclera: Conjunctivae normal.      Pupils: Pupils are equal, round, and reactive to light.  Cardiovascular:     Rate and Rhythm: Normal rate and regular rhythm.     Pulses: Normal pulses.     Heart sounds: Normal heart sounds. No murmur heard. Pulmonary:     Effort: Pulmonary effort is normal. No respiratory distress.     Breath sounds: Normal breath sounds. No wheezing, rhonchi or rales.  Musculoskeletal:     Right lower leg: No edema.     Left lower leg: No edema.     Comments: See HPI for foot exam if done  Skin:    General: Skin is warm and dry.  Findings: No rash.  Neurological:     Mental Status: He is alert.  Psychiatric:        Mood and Affect: Mood normal.        Behavior: Behavior normal.       Results for orders placed or performed in visit on 06/18/24  HgB A1c   Collection Time: 06/18/24  4:29 PM  Result Value Ref Range   Hemoglobin A1C 5.5 4.0 - 5.6 %   HbA1c POC (<> result, manual entry)     HbA1c, POC (prediabetic range)     HbA1c, POC (controlled diabetic range)     Lab Results  Component Value Date   NA 140 04/07/2024   CL 99 04/07/2024   K 4.3 04/07/2024   CO2 25 04/07/2024   BUN 22 04/07/2024   CREATININE 1.07 04/07/2024   GFRNONAA >60 04/07/2024   CALCIUM  9.7 04/07/2024   PHOS 3.5 05/06/2022   ALBUMIN 4.2 12/08/2023   GLUCOSE 86 04/07/2024    Lab Results  Component Value Date   VD25OH 43.72 12/08/2023    Lab Results  Component Value Date   WBC 8.7 04/07/2024   HGB 14.3 04/07/2024   HCT 43.5 04/07/2024   MCV 89.3 04/07/2024   PLT 250 04/07/2024    Assessment & Plan:   Problem List Items Addressed This Visit     Hypertension   Chronic, stable on current regimen - continue.       Severe obesity (BMI 35.0-39.9) with comorbidity (HCC)   Stable period on trulicity  1.5mg  weekly - continue      Type 2 diabetes mellitus with persistent proteinuria (HCC) - Primary   Chronic, great control on current regimen of metformin  500mg  bid, trulicity  1.5mg  weekly, and farxiga   10mg  daily - continue He has established with nephrology Dr Tobie for persistent proteinuria - encouraged f/u due Reviewed fasting and postprandial glycemic goals.  Provided with 15-15 hypoglycemia rule.       Relevant Orders   HgB A1c (Completed)   Vitamin D  deficiency   Vit d dose dropped to 1000 units daily.       Status post total left knee replacement   Diabetic nephropathy with proteinuria [facility])   Appreciate nephrology care.       Right kidney mass   1.1cm complex exophytic hypoechoic lesion noted to R kidney - radiology recommended MRI which I previously ordered. # provided to call for appt. Discussed alternatively f/u with nephrologist for their recommendations if able to get in soon.       Status post total right knee replacement     Meds ordered this encounter  Medications   Cholecalciferol (VITAMIN D3) 25 MCG (1000 UT) CAPS    Sig: Take 1 capsule (1,000 Units total) by mouth daily.    Orders Placed This Encounter  Procedures   HgB A1c    Patient Instructions  Follow up with Dr Tobie kidney doctor Schedule abdominal MRI at [facility] imaging [phone]   Goal fasting sugars 80-120 Goal sugars 2 hours after meal <180 Too low if <70 - let me know if having recurrent lows.   The 15-15 rule for low sugars: If sugar reading below 70, have 15 grams of carbohydrate to raise your blood sugar and check it after 15 minutes. If its still below 70 mg/dL, have another serving. 15 grams of carbs may be: -Glucose tablets (see instructions) -Gel tube (see instructions) -4 ounces (1/2 cup) of juice or regular soda (not diet) -1 tablespoon  of sugar, honey, or corn syrup -Hard candies, jellybeans or gumdrops--see food label for how many to consume  Repeat these steps until your blood sugar is at least 70 mg/dL. Once your blood sugar is back to normal, eat a meal or snack to make sure it doesnt lower again.    Follow up plan: No follow-ups on file.  Anton Blas, MD   "

## 2024-06-24 ENCOUNTER — Ambulatory Visit: Admitting: Rehabilitative and Restorative Service Providers"

## 2024-06-24 ENCOUNTER — Encounter: Payer: Self-pay | Admitting: Rehabilitative and Restorative Service Providers"

## 2024-06-24 DIAGNOSIS — R600 Localized edema: Secondary | ICD-10-CM

## 2024-06-24 DIAGNOSIS — M25561 Pain in right knee: Secondary | ICD-10-CM | POA: Diagnosis not present

## 2024-06-24 DIAGNOSIS — M25661 Stiffness of right knee, not elsewhere classified: Secondary | ICD-10-CM

## 2024-06-24 DIAGNOSIS — M6281 Muscle weakness (generalized): Secondary | ICD-10-CM

## 2024-06-24 DIAGNOSIS — R262 Difficulty in walking, not elsewhere classified: Secondary | ICD-10-CM

## 2024-06-24 NOTE — Therapy (Signed)
 " OUTPATIENT PHYSICAL THERAPY TREATMENT   Patient Name: James Robbins. MRN: [id number] DOB:06-Mar-1951, 74 y.o., male Today's Date: 06/24/2024   END OF SESSION:  PT End of Session - 06/24/24 1017     Visit Number 14    Number of Visits 18    Date for Recertification  07/20/24    Authorization Type [employer] $25 copay    Authorization Time Period 05/26/2024- 07/21/2023    Authorization - Number of Visits 10    Progress Note Due on Visit 20    PT Start Time 1017    PT Stop Time 1114    PT Time Calculation (min) 57 min    Equipment Utilized During Treatment Gait belt    Activity Tolerance Patient tolerated treatment well;No increased pain    Behavior During Therapy WFL for tasks assessed/performed                  Past Medical History:  Diagnosis Date   Arthritis    Atrial fibrillation (HCC)    Chronic kidney disease (CKD), stage I    proteinuria   Controlled type 2 diabetes mellitus with diabetic nephropathy (HCC)    Completed DSME 2017   Dysrhythmia    A. Fib   Hematuria 2014   s/p normal urological workup ([place])   Hyperlipidemia    Hypertension    Obesity    Seasonal allergic rhinitis    Sleep apnea    has cpap machine; does not use   Past Surgical History:  Procedure Laterality Date   CARDIOVERSION N/A 11/27/2020   Procedure: CARDIOVERSION;  Surgeon: Nahser, Aleene PARAS, MD;  Location: [facility] ENDOSCOPY;  Service: Cardiovascular;  Laterality: N/A;   CATARACT EXTRACTION Bilateral 08/2021   COLONOSCOPY  06/10/2004   per pt normal ([place])   TOTAL KNEE ARTHROPLASTY Left 06/16/2023   Procedure: LEFT TOTAL KNEE ARTHROPLASTY;  Surgeon: Jerri Kay HERO, MD;  Location: [facility] OR;  Service: Orthopedics;  Laterality: Left;   TOTAL KNEE ARTHROPLASTY Right 04/12/2024   Procedure: ARTHROPLASTY, KNEE, TOTAL;  Surgeon: Jerri Kay HERO, MD;  Location: [facility] OR;  Service: Orthopedics;  Laterality: Right;   VASECTOMY     VASECTOMY REVERSAL     Patient Active Problem List   Diagnosis  Date Noted   Status post total right knee replacement 04/12/2024   Primary osteoarthritis of right knee 04/11/2024   Skin rash 03/19/2024   Right kidney mass 03/19/2024   Multiple acquired cysts of kidney 03/19/2024   Diabetic nephropathy with proteinuria (HCC) 12/16/2023   Status post total left knee replacement 06/16/2023   Hypercoagulable state due to permanent atrial fibrillation (HCC) 01/01/2023   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) 01/01/2022   Hypertensive retinopathy of both eyes 01/01/2022   Vision loss of right eye 09/06/2021   Medicare annual wellness visit, subsequent 09/05/2021   Right retinal artery branch occlusion 04/05/2021   Obstructive sleep apnea hypopnea, severe 11/07/2020   Permanent atrial fibrillation (HCC) 11/07/2020   Primary osteoarthritis of knees, bilateral 04/23/2019   Vitamin D  deficiency 09/23/2016   Advanced care planning/counseling discussion 07/20/2015   Encounter for general adult medical examination with abnormal findings 02/21/2014   Severe obesity (BMI 35.0-39.9) with comorbidity (HCC)    Type 2 diabetes mellitus with persistent proteinuria (HCC)    Seasonal allergic rhinitis 12/24/2011   Hypertension 12/24/2011   Hyperlipidemia associated with type 2 diabetes mellitus (HCC) 12/24/2011    PCP: Anton Blas, MD  REFERRING PROVIDER: Kay HERO Jerri, MD  REFERRING DIAG: S03.348 (ICD-10-CM) - Status post total right knee replacement M17.11 (ICD-10-CM) - Primary osteoarthritis of right knee  THERAPY DIAG:  Difficulty in walking, not elsewhere classified  Muscle weakness (generalized)  Localized edema  Acute pain of right knee  Stiffness of right knee, not elsewhere classified  Rationale for Evaluation and Treatment: Rehabilitation  ONSET DATE: 04/12/2024 Rt TKA  SUBJECTIVE:   SUBJECTIVE STATEMENT: [name] notes it still takes about 2 days to get over (his) exercises.  Strength and balance are improving,  although both will benefit from additional work.  PERTINENT HISTORY: 04/12/2024 Rt TKA, Atrial fibrillation, Stage 1 CKD, Type 2 DM, HLD, HTN, obesity, previous Lt TKA in January 2025   PAIN:  NPRS scale: 1-2/10 this week Pain location: Rt knee Pain description: Achy Aggravating factors: Prolonged postures Relieving factors: Ice, tylenol , oxycodone  and a muscle relaxer  PRECAUTIONS: Other: See past medical history  RED FLAGS: None   WEIGHT BEARING RESTRICTIONS: No  FALLS:  Has patient fallen in last 6 months? No  LIVING ENVIRONMENT: Lives with: lives with their family and lives with their spouse Lives in: House/apartment Stairs: Manages with a handrail and some effort Has following equipment at home: Environmental Consultant - 2 wheeled  OCCUPATION: Retired  PLOF: Independent  PATIENT GOALS: Needs to get up and down 7 stairs now, will need to do more, 5 acres of yard work  OBJECTIVE:  Note: Objective measures were completed at Evaluation unless otherwise noted.  DIAGNOSTIC FINDINGS: IMPRESSION: 1. Immediate postoperative right total knee arthroplasty with components in anatomic alignment and no periprosthetic fracture or dislocation.  PATIENT SURVEYS:  PSFS: THE PATIENT SPECIFIC FUNCTIONAL SCALE  Place score of 0-10 (0 = unable to perform activity and 10 = able to perform activity at the same level as before injury or problem)  Activity Date: 04/27/2024 05/25/2024 06/14/24  Walking 4 3 5   2.  Working in the yard 3 3 4   3.  Taking out the trash 3 5 7   4.  Shopping 3 4 7   5.  Nilda eating 3 0 5  Total Score 3.2 3.0 5.6  Total Score = Sum of activity scores/number of activities  Minimally Detectable Change: 3 points (for single activity); 2 points (for average score)  [employer] (nd). The Patient Specific Functional Scale . Retrieved from [email or web address]   COGNITION: 04/27/2024 Overall cognitive status: Within  functional limits for tasks assessed     SENSATION: 04/27/2024 [facility]  EDEMA:  04/27/2024 Expected swelling   LOWER EXTREMITY ROM:  ROM Right 04/27/24 AROM Left 04/27/24 AROM Right 05/12/24 Right 05/25/2024 Left/Right 06/24/2024  Hip flexion       Hip extension       Hip abduction       Hip adduction       Hip internal rotation       Hip external rotation       Knee flexion 92 129 A: 102* 120 AROM in supine heel slide 118/130  Knee extension - 7 - 2 Quad set -5* -9 AROM in seated LAQ   -5 in supine heel prop PROM -3/-2  Ankle dorsiflexion       Ankle plantarflexion       Ankle inversion       Ankle eversion        (Blank rows = not tested)  LOWER EXTREMITY STRENGTH:   Right 04/27/2024 Left 04/27/2024 Right 05/19/2024 Right 05/25/2024 Right 06/07/2024 Left 06/07/2024 Left/Right 06/24/2024  Hip flexion  Hip extension         Hip abduction         Hip adduction         Hip internal rotation         Hip external rotation         Knee flexion    5/5     Knee extension 6.9 lbs 37.6 lbs  23.2, 22.4 lbs 4/5 26, 24.8 lbs 40, 37 lbs 50, 53 lbs 42.7/39.5  Ankle dorsiflexion         Ankle plantarflexion         Ankle inversion         Ankle eversion          (Blank rows = not tested)  GAIT: 05/19/2024: Ambulation modified independent with SPC within clinic.   04/27/2024 Distance walked: 100 feet Assistive device utilized: Environmental Consultant - 2 wheeled Level of assistance: Modified independence Comments: Geoffrey was assistive device free before surgery                TODAY'S TREATMENT      DATE:  06/24/2023  Functional Activities: Double Leg Press full range (emphasis on full extension) for stairs, curbs 75# & 100# 10 x each weight, slow eccentrics Single Leg Press full range (emphasis on full extension) for stairs, curbs 50# 10 x each side Reciprocal stairs with 1 hand-rail 4 flights with cues for slow eccentrics  Neuromuscular re-education: Tandem balance 10 x  20 seconds, emphasis on step strategy Slow marching, emphasis on high knees and landing softly with a wide stance for stepping over obstacles and maintaining single-leg balance 8 laps  [id number]: Discussed the importance of continued strength and balance work before transfer into independent rehabilitation   TODAY'S TREATMENT      DATE:  06/15/2023 Therex: Recumbent bike lvl 3 8.5 mins for ROM, seat 6. Incline gastroc stretch 30 sec x 3 bilateral  Knee extension machine double leg up, Rt leg lowering slowly 10 lbs 2 x 10  Hamstring Curl machine BLEs 35 lbs 2 x 10   TherActivity (to improve stairs, squatting, transfers) Leg press double leg 118 lb with slow lowering focus x 15  Leg press single leg 56 lbs 2 x 15, bilaterally  Stairs PT demos and verbal cues on technique, all with 2 rails - pt descended 5 steps, step to pattern using RLE and 6 steps alternating pattern; ascended alternating pattern. Pt. required verbal cues but had knee stability.    TREATMENT      DATE:  06/07/2024 Therex: Recumbent bike lvl 3 8.5 mins for ROM, seat 6. Incline gastroc stretch 30 sec x 3 bilateral  Knee extension machine double leg up, Rt leg lowering slowly 10 lbs 2 x 10  Supine quad set in heel prop 5 sec hold x 10 bilaterally concurrently  Supine SAQ over triangle black wedge 3 sec hold x 10 bilaterally performed concurrently    TherActivity (to improve stairs, squatting, transfers) Leg press double leg 112 lb with slow lowering focus x 15  Leg press single leg 50 lbs 2 x 15, bilaterally    HOME EXERCISE PROGRAM: Access Code: [email or web address] URL: [email or web address] Date: 06/24/2024 Prepared by: Lamar Ivory  Exercises - Supine Quadricep Sets  - 5 x daily - 7 x weekly - 2 sets - 10 reps - 5 second hold - Seated Knee Extension AROM  - 5 x daily - 7 x weekly - 1 sets - 10 reps - 5 seconds hold -  Seated Knee Flexion AAROM  - 5 x daily - 7 x weekly - 1 sets - 10 reps - 5 seconds hold -  Hip Flexion  - 5 x daily - 7 x weekly - 1 sets - 10 reps - 5 seconds hold - Tandem Stance  - 1 x daily - 7 x weekly - 1 sets - 10 reps - 20 second hold - Runner's March  - 1 x daily - 7 x weekly - 1 sets - 20 reps - 3 seconds hold  ASSESSMENT:  CLINICAL IMPRESSION: Medhansh is very happy with his progress with his supervised physical therapy.  He still has some difficulty sleeping, concerns with balance without the cane and he will benefit from continuing to improve bilateral lower extremity strength as Revan had both knees replaced in the same calendar year.  Armstead mentioned he would like to transition into independent rehabilitation and a gym program as soon as appropriate.  I recommended he spend a bit more time working on his balance and gait quality without the cane while getting more independent with the gym.  I recommended he have this discussion with Grayce on his next supervised physical therapy visit.   PARTICIPATION LIMITATIONS: driving, shopping, community activity, and yard work  PERSONAL FACTORS: Atrial fibrillation, Stage 1 CKD, Type 2 DM, HLD, HTN, obesity, previous Lt TKA in January 2025 are also affecting patient's functional outcome.   REHAB POTENTIAL: Good  CLINICAL DECISION MAKING: Evolving/moderate complexity  EVALUATION COMPLEXITY: Moderate   GOALS: Goals reviewed with patient? Yes  SHORT TERM GOALS: Target date: 06/08/2024  Drakkar will be independent with his day 1 HEP  Goal status: Met 05/25/2024  2.  Improve right knee AROM to 0 - 3 - 105 degrees  Goal status: Met 06/24/2024  3.  Improve right quadriceps strength as assessed by safe transition to a single point cane  Goal status: Met 05/25/2024   LONG TERM GOALS: Target date: 07/16/2023  Improve Patient Specific Functional Score to 6 Baseline: 3.2 Goal status: Ongoing 06/14/2024  2.  Gracen will report right knee pain consistently 3/10 or less on the Visual Analog Scale Baseline: 3-5/10 Goal status:  Met 06/24/2024  3.  Improve knee AROM to 0 - 3 - 110 degrees or better Baseline: 0 - 7 -92 degrees Goal status: Met 06/24/2024  4.  Improve bilateral quadriceps strength to at least 40 pounds Baseline: 6.9 pounds on the right Goal status: Partially met 06/24/2024  5.  Jaimes will be independent with his long-term maintenance HEP at [place] Baseline: Started 11/18/205 Goal status: Ongoing 06/24/2024   PLAN:  PT FREQUENCY: 2-3 times a week  PT DURATION: 2 to 3 weeks  PLANNED INTERVENTIONS: 97750- Physical Performance Testing, 97110-Therapeutic exercises, 97530- Therapeutic activity, 97112- Neuromuscular re-education, 97535- Self Care, [id number]- Manual therapy, [phone]- Gait training, [phone]- Vasopneumatic device, Patient/Family education, Balance training, Stair training, Joint mobilization, Cryotherapy, and Moist heat  PLAN FOR NEXT SESSION: Balance and gait progressions to make Esequiel safe and comfortable without use of an assistive device.  Weed down and prioritize activities with his home exercises with emphasis on strength and balance.   Myer LELON Ivory, PT, MPT 06/24/2024, 11:27 AM   "

## 2024-06-28 ENCOUNTER — Ambulatory Visit: Admitting: Physical Therapy

## 2024-06-28 ENCOUNTER — Encounter: Payer: Self-pay | Admitting: Physical Therapy

## 2024-06-28 DIAGNOSIS — M25661 Stiffness of right knee, not elsewhere classified: Secondary | ICD-10-CM | POA: Diagnosis not present

## 2024-06-28 DIAGNOSIS — M6281 Muscle weakness (generalized): Secondary | ICD-10-CM

## 2024-06-28 DIAGNOSIS — R262 Difficulty in walking, not elsewhere classified: Secondary | ICD-10-CM | POA: Diagnosis not present

## 2024-06-28 DIAGNOSIS — M25561 Pain in right knee: Secondary | ICD-10-CM

## 2024-06-28 DIAGNOSIS — R600 Localized edema: Secondary | ICD-10-CM

## 2024-06-28 NOTE — Therapy (Signed)
 " OUTPATIENT PHYSICAL THERAPY TREATMENT   Patient Name: James Robbins. MRN: [id number] DOB:[birth year], 74 y.o., male Today's Date: 06/28/2024   END OF SESSION:  PT End of Session - 06/28/24 1524     Visit Number 15    Number of Visits 18    Date for Recertification  07/20/24    Authorization Type [employer] $25 copay    Authorization Time Period 05/26/2024- 07/21/2023    Authorization - Number of Visits 10    Progress Note Due on Visit 20    PT Start Time 1515    PT Stop Time 1555    PT Time Calculation (min) 40 min    Behavior During Therapy WFL for tasks assessed/performed                   Past Medical History:  Diagnosis Date   Arthritis    Atrial fibrillation (HCC)    Chronic kidney disease (CKD), stage I    proteinuria   Controlled type 2 diabetes mellitus with diabetic nephropathy (HCC)    Completed DSME 2017   Dysrhythmia    A. Fib   Hematuria 2014   s/p normal urological workup ([place])   Hyperlipidemia    Hypertension    Obesity    Seasonal allergic rhinitis    Sleep apnea    has cpap machine; does not use   Past Surgical History:  Procedure Laterality Date   CARDIOVERSION N/A 11/27/2020   Procedure: CARDIOVERSION;  Surgeon: Nahser, Aleene PARAS, MD;  Location: [facility] ENDOSCOPY;  Service: Cardiovascular;  Laterality: N/A;   CATARACT EXTRACTION Bilateral 08/2021   COLONOSCOPY  06/10/2004   per pt normal ([place])   TOTAL KNEE ARTHROPLASTY Left 06/16/2023   Procedure: LEFT TOTAL KNEE ARTHROPLASTY;  Surgeon: Jerri Kay HERO, MD;  Location: [facility] OR;  Service: Orthopedics;  Laterality: Left;   TOTAL KNEE ARTHROPLASTY Right 04/12/2024   Procedure: ARTHROPLASTY, KNEE, TOTAL;  Surgeon: Jerri Kay HERO, MD;  Location: [facility] OR;  Service: Orthopedics;  Laterality: Right;   VASECTOMY     VASECTOMY REVERSAL     Patient Active Problem List   Diagnosis Date Noted   Status post total right knee replacement 04/12/2024   Primary osteoarthritis of right knee 04/11/2024   Skin  rash 03/19/2024   Right kidney mass 03/19/2024   Multiple acquired cysts of kidney 03/19/2024   Diabetic nephropathy with proteinuria (HCC) 12/16/2023   Status post total left knee replacement 06/16/2023   Hypercoagulable state due to permanent atrial fibrillation (HCC) 01/01/2023   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) 01/01/2022   Hypertensive retinopathy of both eyes 01/01/2022   Vision loss of right eye 09/06/2021   Medicare annual wellness visit, subsequent 09/05/2021   Right retinal artery branch occlusion 04/05/2021   Obstructive sleep apnea hypopnea, severe 11/07/2020   Permanent atrial fibrillation (HCC) 11/07/2020   Primary osteoarthritis of knees, bilateral 04/23/2019   Vitamin D  deficiency 09/23/2016   Advanced care planning/counseling discussion 07/20/2015   Encounter for general adult medical examination with abnormal findings 02/21/2014   Severe obesity (BMI 35.0-39.9) with comorbidity (HCC)    Type 2 diabetes mellitus with persistent proteinuria (HCC)    Seasonal allergic rhinitis 12/24/2011   Hypertension 12/24/2011   Hyperlipidemia associated with type 2 diabetes mellitus (HCC) 12/24/2011    PCP: Anton Blas, MD  REFERRING PROVIDER: Kay HERO Jerri, MD  REFERRING DIAG: [phone] (ICD-10-CM) - Status post total right knee replacement M17.11 (ICD-10-CM) - Primary osteoarthritis of right  knee  THERAPY DIAG:  Difficulty in walking, not elsewhere classified  Muscle weakness (generalized)  Localized edema  Acute pain of right knee  Stiffness of right knee, not elsewhere classified  Rationale for Evaluation and Treatment: Rehabilitation  ONSET DATE: 04/12/2024 Rt TKA  SUBJECTIVE:   SUBJECTIVE STATEMENT: Kaelon notes it still takes about 2 days to get over (his) exercises.  Strength and balance are improving, although both will benefit from additional work.  PERTINENT HISTORY: 04/12/2024 Rt TKA, Atrial fibrillation, Stage 1 CKD, Type  2 DM, HLD, HTN, obesity, previous Lt TKA in January 2025   PAIN:  NPRS scale: 1-2/10 this week Pain location: Rt knee Pain description: Achy Aggravating factors: Prolonged postures Relieving factors: Ice, tylenol , oxycodone  and a muscle relaxer  PRECAUTIONS: Other: See past medical history  RED FLAGS: None   WEIGHT BEARING RESTRICTIONS: No  FALLS:  Has patient fallen in last 6 months? No  LIVING ENVIRONMENT: Lives with: lives with their family and lives with their spouse Lives in: House/apartment Stairs: Manages with a handrail and some effort Has following equipment at home: Environmental Consultant - 2 wheeled  OCCUPATION: Retired  PLOF: Independent  PATIENT GOALS: Needs to get up and down 7 stairs now, will need to do more, 5 acres of yard work  OBJECTIVE:  Note: Objective measures were completed at Evaluation unless otherwise noted.  DIAGNOSTIC FINDINGS: IMPRESSION: 1. Immediate postoperative right total knee arthroplasty with components in anatomic alignment and no periprosthetic fracture or dislocation.  PATIENT SURVEYS:  PSFS: THE PATIENT SPECIFIC FUNCTIONAL SCALE  Place score of 0-10 (0 = unable to perform activity and 10 = able to perform activity at the same level as before injury or problem)  Activity Date: 04/27/2024 05/25/2024 06/14/24  Walking 4 3 5   2.  Working in the yard 3 3 4   3.  Taking out the trash 3 5 7   4.  Shopping 3 4 7   5.  Weed eating 3 0 5  Total Score 3.2 3.0 5.6  Total Score = Sum of activity scores/number of activities  Minimally Detectable Change: 3 points (for single activity); 2 points (for average score)  [facility] (nd). The Patient Specific Functional Scale . Retrieved from [email or web address]   COGNITION: 04/27/2024 Overall cognitive status: Within functional limits for tasks assessed     SENSATION: 04/27/2024 [facility]  EDEMA:  04/27/2024 Expected swelling   LOWER  EXTREMITY ROM:  ROM Right 04/27/24 AROM Left 04/27/24 AROM Right 05/12/24 Right 05/25/2024 Left/Right 06/24/2024  Hip flexion       Hip extension       Hip abduction       Hip adduction       Hip internal rotation       Hip external rotation       Knee flexion 92 129 A: 102* 120 AROM in supine heel slide 118/130  Knee extension - 7 - 2 Quad set -5* -9 AROM in seated LAQ   -5 in supine heel prop PROM -3/-2  Ankle dorsiflexion       Ankle plantarflexion       Ankle inversion       Ankle eversion        (Blank rows = not tested)  LOWER EXTREMITY STRENGTH:   Right 04/27/2024 Left 04/27/2024 Right 05/19/2024 Right 05/25/2024 Right 06/07/2024 Left 06/07/2024 Left/Right 06/24/2024  Hip flexion         Hip extension         Hip abduction  Hip adduction         Hip internal rotation         Hip external rotation         Knee flexion    5/5     Knee extension 6.9 lbs 37.6 lbs  23.2, 22.4 lbs 4/5 26, 24.8 lbs 40, 37 lbs 50, 53 lbs 42.7/39.5  Ankle dorsiflexion         Ankle plantarflexion         Ankle inversion         Ankle eversion          (Blank rows = not tested)  GAIT: 05/19/2024: Ambulation modified independent with SPC within clinic.   04/27/2024 Distance walked: 100 feet Assistive device utilized: Environmental Consultant - 2 wheeled Level of assistance: Modified independence Comments: Khayri was assistive device free before surgery ---------------------------------------------------------------------------------------------------------------------------------------------------  TODAY'S TREATMENT      DATE:  06/29/2023 TherEx Recumbent bike: 6 minutes level 4  Gait: Instructions in heel to toe gait pattern with equalized wt shifting and incresaed knee flexion, hip flexion Stepping over TB lying on the floor with lead LE without breaking stride, pt requiring several attempts over 3 TB strips TherAct Double leg press: 100# 3 x 10  Single Leg Press: 50# 2 x 10   Step ups on 6 inch step  x 15 leading with Rt LE c single UE support NMR:  Rocker board: fitter first: 2 point, med/lat, ant/post both x 2 minutes Walking Airex beam x 6 c UE support as needed Standing fixed hip rotation on Rt LE working on IR with Rt foot plated and rotating hips and left LE toward the right    TREATMENT      DATE:  06/24/2023  Functional Activities: Double Leg Press full range (emphasis on full extension) for stairs, curbs 75# & 100# 10 x each weight, slow eccentrics Single Leg Press full range (emphasis on full extension) for stairs, curbs 50# 10 x each side Reciprocal stairs with 1 hand-rail 4 flights with cues for slow eccentrics  Neuromuscular re-education: Tandem balance 10 x 20 seconds, emphasis on step strategy Slow marching, emphasis on high knees and landing softly with a wide stance for stepping over obstacles and maintaining single-leg balance 8 laps  [id number]: Discussed the importance of continued strength and balance work before transfer into independent rehabilitation    TREATMENT      DATE:  06/15/2023 Therex: Recumbent bike lvl 3 8.5 mins for ROM, seat 6. Incline gastroc stretch 30 sec x 3 bilateral  Knee extension machine double leg up, Rt leg lowering slowly 10 lbs 2 x 10  Hamstring Curl machine BLEs 35 lbs 2 x 10   TherActivity (to improve stairs, squatting, transfers) Leg press double leg 118 lb with slow lowering focus x 15  Leg press single leg 56 lbs 2 x 15, bilaterally  Stairs PT demos and verbal cues on technique, all with 2 rails - pt descended 5 steps, step to pattern using RLE and 6 steps alternating pattern; ascended alternating pattern. Pt. required verbal cues but had knee stability.    TREATMENT      DATE:  06/07/2024 Therex: Recumbent bike lvl 3 8.5 mins for ROM, seat 6. Incline gastroc stretch 30 sec x 3 bilateral  Knee extension machine double leg up, Rt leg lowering slowly 10 lbs 2 x 10  Supine quad set in heel prop 5 sec  hold x 10 bilaterally concurrently  Supine SAQ over triangle black  wedge 3 sec hold x 10 bilaterally performed concurrently    TherActivity (to improve stairs, squatting, transfers) Leg press double leg 112 lb with slow lowering focus x 15  Leg press single leg 50 lbs 2 x 15, bilaterally    HOME EXERCISE PROGRAM: Access Code: [email or web address] URL: [email or web address] Date: 06/24/2024 Prepared by: Lamar Ivory  Exercises - Supine Quadricep Sets  - 5 x daily - 7 x weekly - 2 sets - 10 reps - 5 second hold - Seated Knee Extension AROM  - 5 x daily - 7 x weekly - 1 sets - 10 reps - 5 seconds hold - Seated Knee Flexion AAROM  - 5 x daily - 7 x weekly - 1 sets - 10 reps - 5 seconds hold - Hip Flexion  - 5 x daily - 7 x weekly - 1 sets - 10 reps - 5 seconds hold - Tandem Stance  - 1 x daily - 7 x weekly - 1 sets - 10 reps - 20 second hold - Runner's March  - 1 x daily - 7 x weekly - 1 sets - 20 reps - 3 seconds hold  ASSESSMENT:  CLINICAL IMPRESSION: Pt feels like his balance is still a concern. Treatment today consisted c beginning dynamic balance and gait activities. Pt requiring UE support to maintain balance and for safety during today's exercises. Pt recommending continued skilled PT interventions toward goals set.    PARTICIPATION LIMITATIONS: driving, shopping, community activity, and yard work  PERSONAL FACTORS: Atrial fibrillation, Stage 1 CKD, Type 2 DM, HLD, HTN, obesity, previous Lt TKA in January 2025 are also affecting patient's functional outcome.   REHAB POTENTIAL: Good  CLINICAL DECISION MAKING: Evolving/moderate complexity  EVALUATION COMPLEXITY: Moderate   GOALS: Goals reviewed with patient? Yes  SHORT TERM GOALS: Target date: 06/08/2024  Raymir will be independent with his day 1 HEP  Goal status: Met 05/25/2024  2.  Improve right knee AROM to 0 - 3 - 105 degrees  Goal status: Met 06/24/2024  3.  Improve right quadriceps strength as assessed by  safe transition to a single point cane  Goal status: Met 05/25/2024   LONG TERM GOALS: Target date: 07/16/2023  Improve Patient Specific Functional Score to 6 Baseline: 3.2 Goal status: Ongoing 06/14/2024  2.  Rector will report right knee pain consistently 3/10 or less on the Visual Analog Scale Baseline: 3-5/10 Goal status: Met 06/24/2024  3.  Improve knee AROM to 0 - 3 - 110 degrees or better Baseline: 0 - 7 -92 degrees Goal status: Met 06/24/2024  4.  Improve bilateral quadriceps strength to at least 40 pounds Baseline: 6.9 pounds on the right Goal status: Partially met 06/24/2024  5.  Truett will be independent with his long-term maintenance HEP at [place] Baseline: Started 11/18/205 Goal status: Ongoing 06/24/2024   PLAN:  PT FREQUENCY: 2-3 times a week  PT DURATION: 2 to 3 weeks  PLANNED INTERVENTIONS: 97750- Physical Performance Testing, 97110-Therapeutic exercises, 97530- Therapeutic activity, 97112- Neuromuscular re-education, 97535- Self Care, [id number]- Manual therapy, [phone]- Gait training, [phone]- Vasopneumatic device, Patient/Family education, Balance training, Stair training, Joint mobilization, Cryotherapy, and Moist heat  PLAN FOR NEXT SESSION:  update HEP at next visit for more strengthening and balance    Delon JONELLE Lunger, PT, MPT 06/28/2024, 3:59 PM   "

## 2024-06-30 ENCOUNTER — Encounter: Admitting: Physical Therapy

## 2024-07-05 ENCOUNTER — Encounter: Admitting: Physical Therapy

## 2024-07-07 ENCOUNTER — Encounter: Admitting: Physical Therapy

## 2024-07-13 ENCOUNTER — Encounter: Admitting: Physician Assistant

## 2024-07-13 ENCOUNTER — Encounter: Admitting: Physical Therapy

## 2024-07-14 ENCOUNTER — Encounter: Admitting: Physical Therapy

## 2024-07-19 ENCOUNTER — Encounter: Admitting: Physical Therapy

## 2024-07-21 ENCOUNTER — Encounter: Admitting: Physical Therapy

## 2024-07-23 ENCOUNTER — Encounter: Admitting: Physician Assistant

## 2024-07-26 ENCOUNTER — Encounter: Admitting: Physical Therapy

## 2024-07-28 ENCOUNTER — Encounter: Admitting: Physical Therapy

## 2024-09-23 ENCOUNTER — Ambulatory Visit

## 2024-09-24 ENCOUNTER — Ambulatory Visit

## 2024-12-13 ENCOUNTER — Other Ambulatory Visit

## 2024-12-20 ENCOUNTER — Encounter: Admitting: Family Medicine
